# Patient Record
Sex: Female | Born: 1990 | Race: White | Hispanic: No | Marital: Single | Smoking: Never smoker
Health system: Southern US, Community
[De-identification: ages and names within clinical notes are randomized; demographics above are authoritative.]

## PROBLEM LIST (undated history)

## (undated) DIAGNOSIS — F32A Depression, unspecified: Secondary | ICD-10-CM

## (undated) DIAGNOSIS — G90A Postural orthostatic tachycardia syndrome (POTS): Secondary | ICD-10-CM

## (undated) DIAGNOSIS — I498 Other specified cardiac arrhythmias: Secondary | ICD-10-CM

## (undated) DIAGNOSIS — K911 Postgastric surgery syndromes: Secondary | ICD-10-CM

## (undated) DIAGNOSIS — Q796 Ehlers-Danlos syndrome, unspecified: Secondary | ICD-10-CM

## (undated) DIAGNOSIS — F419 Anxiety disorder, unspecified: Secondary | ICD-10-CM

## (undated) HISTORY — PX: UPPER GI ENDOSCOPY: SHX6162

## (undated) HISTORY — PX: INNER EAR SURGERY: SHX679

---
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
## 2014-09-12 ENCOUNTER — Other Ambulatory Visit: Payer: Self-pay | Admitting: Cardiology

## 2014-09-12 DIAGNOSIS — I701 Atherosclerosis of renal artery: Secondary | ICD-10-CM

## 2014-09-12 DIAGNOSIS — I150 Renovascular hypertension: Secondary | ICD-10-CM

## 2014-09-21 ENCOUNTER — Ambulatory Visit
Admission: RE | Admit: 2014-09-21 | Discharge: 2014-09-21 | Disposition: A | Discharge status: Home / Self Care | Payer: 59 | Source: Ambulatory Visit | Attending: Cardiology | Admitting: Cardiology

## 2014-09-21 DIAGNOSIS — I150 Renovascular hypertension: Secondary | ICD-10-CM

## 2014-09-21 DIAGNOSIS — I701 Atherosclerosis of renal artery: Secondary | ICD-10-CM

## 2014-09-21 MED ORDER — GADOBENATE DIMEGLUMINE 529 MG/ML IV SOLN
12.0000 mL | Freq: Once | INTRAVENOUS | Status: AC | PRN
  Administered 2014-09-21: 12 mL via INTRAVENOUS

## 2014-10-10 DIAGNOSIS — I10 Essential (primary) hypertension: Secondary | ICD-10-CM | POA: Insufficient documentation

## 2014-10-24 DIAGNOSIS — I951 Orthostatic hypotension: Secondary | ICD-10-CM | POA: Insufficient documentation

## 2014-11-04 DIAGNOSIS — G90A Postural orthostatic tachycardia syndrome (POTS): Secondary | ICD-10-CM | POA: Insufficient documentation

## 2014-11-04 DIAGNOSIS — I498 Other specified cardiac arrhythmias: Secondary | ICD-10-CM | POA: Insufficient documentation

## 2018-09-20 ENCOUNTER — Encounter

## 2018-09-21 ENCOUNTER — Encounter: Payer: Self-pay | Admitting: Physical Therapy

## 2018-09-21 ENCOUNTER — Other Ambulatory Visit: Payer: Self-pay

## 2018-09-21 ENCOUNTER — Ambulatory Visit: Payer: Managed Care, Other (non HMO) | Attending: Internal Medicine | Admitting: Physical Therapy

## 2018-09-21 DIAGNOSIS — R42 Dizziness and giddiness: Secondary | ICD-10-CM | POA: Insufficient documentation

## 2018-09-21 DIAGNOSIS — R000 Tachycardia, unspecified: Secondary | ICD-10-CM | POA: Insufficient documentation

## 2018-09-21 DIAGNOSIS — G90A Postural orthostatic tachycardia syndrome (POTS): Secondary | ICD-10-CM

## 2018-09-21 DIAGNOSIS — I951 Orthostatic hypotension: Secondary | ICD-10-CM | POA: Diagnosis present

## 2018-09-21 DIAGNOSIS — I498 Other specified cardiac arrhythmias: Secondary | ICD-10-CM

## 2018-09-21 NOTE — Therapy (Signed)
[facility] [address] [place], [place], [address] Phone: [phone]   Fax:  [phone]  Physical Therapy Treatment  Patient Details  Name: Roberta SchaumannMegan L Niziolek MRN: [id number] Date of Birth: 1990/04/26 Referring Provider (PT): Dr. Merri BrunetteWalter Pharr, Dr. Gwendlyn DeutscherBrian Rubery (Cardiologist)    Encounter Date: 09/21/2018  PT End of Session - 09/21/18 1052    Visit Number  1    Number of Visits  8    Date for PT Re-Evaluation  11/16/18    PT Start Time  0830    PT Stop Time  0916    PT Time Calculation (min)  46 min    Activity Tolerance  Patient tolerated treatment well    Behavior During Therapy  [facility] for tasks assessed/performed       History reviewed. No pertinent past medical history.  History reviewed. No pertinent surgical history.  There were no vitals filed for this visit.  Subjective Assessment - 09/21/18 0836    Subjective  Pt was diagnosed about 5 yrs ago with POTS.  She has a rapid HR all day long.  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  Gradually she has noticed decreased ability to walk, jog and even work. Does not do much on her days off.  She has to see a patient and then doc each time as standing still is very challenging.   She feels weak, mildly in LEs.  Has a Fitbit HR monitor.    Pertinent History  POTS    Limitations  Standing    How long can you stand comfortably?  15 min    How long can you walk comfortably?  can do up to an hour.    Diagnostic tests  tilt table    Patient Stated Goals  Patient would like to have mroe energy and strength    Currently in Pain?  No/denies         [facility] PT Assessment - 09/21/18 0001      Assessment   Medical Diagnosis  POTS    Referring Provider (PT)  Dr. Merri BrunetteWalter Pharr, Dr. Gwendlyn DeutscherBrian Rubery (Cardiologist)     Onset Date/Surgical Date  --   5 yrs    Hand Dominance  Right    Prior Therapy  no       Precautions   Precautions  None      Restrictions   Weight Bearing  Restrictions  No      Balance Screen   Has the patient fallen in the past 6 months  No      Home Environment   Living Environment  Private residence    Living Arrangements  Alone    Type of Home  Apartment    Home Access  Elevator    Home Layout  One level      Prior Function   Level of Independence  Independent    Vocation  Full time employment    Vocation Requirements  SLP     Leisure  friends, shopping, active       Cognition   Overall Cognitive Status  Within Functional Limits for tasks assessed      Observation/Other Assessments   Focus on Therapeutic Outcomes (FOTO)   NT due to diagnosis       Sensation   Light Touch  Appears Intact      Squat   Comments  good      Single Leg Stance   Comments  good, visible lateral hip weakness       Sit to Stand   Comments  30 min no UE x 10 reps       Posture/Postural Control   Posture Comments  locks knees in standing , mild valgus       Strength   Right Shoulder Flexion  4+/5    Right Shoulder ABduction  4/5    Left Shoulder Flexion  4+/5    Left Shoulder ABduction  4/5    Right Hand Grip (lbs)  40    Left Hand Grip (lbs)  38    Right Hip Flexion  4+/5    Left Hip Flexion  4+/5    Right/Left Knee  --   4+/5      Palpation   Spinal mobility          Resting vitals:  HR 82  BP 146/90 Orthostatic HR 79  BP 130/92 O2 sats 98-99% throughout      PT Short Term Goals - 09/21/18 1056      PT SHORT TERM GOAL #1   Title  Pt will be consistent and I with HEP for core, large muscle group strengthening    Time  4    Period  Weeks    Status  New    Target Date  10/19/18      PT SHORT TERM GOAL #2   Title  Pt will use RPE and HR monitor (Fitbit) to monitor self while doing cardio    Time  4    Period  Weeks    Status  New    Target Date  10/19/18      PT SHORT TERM GOAL #3   Title  Pt will be able to report feeling less fatigue post work, able to do simple core work after a few hours of rest.    Time  4     Period  Weeks    Status  New    Target Date  10/19/18        PT Long Term Goals - 09/21/18 1453      PT LONG TERM GOAL #1   Title  Pt will be able to show Independence with HEP and consistent cardio routine    Time  8    Period  Weeks    Status  New    Target Date  11/16/18      PT LONG TERM GOAL #2   Title  Patient will be able to squat, lift up to 15 lbs x 10 with no pain, just min fatigue    Time  8    Period  Weeks    Status  New    Target Date  11/16/18      PT LONG TERM GOAL #3   Title  Pt will be able to see 2 patients at work consecutively without need to rest, document.    Time  8    Period  Weeks    Status  New    Target Date  11/16/18      PT LONG TERM GOAL #4   Title  Pt will be able to complete 30 min of cardio without undue fatigue    Time  8    Period  Weeks    Status  New    Target Date  11/16/18            Plan - 09/21/18 1225    Clinical Impression Statement  Patient presents  for low complexity eval of POTS and associated symptoms of fatigue, tachycardia and dizziness.  She demonstrated an appropriate rise of HR with activity. Educated on RPE, principles of exercise and objectives of PT to positviely impact her condition. She plans to bring in her Fit Bit next time. She has an apartment gym which she can use.  She realizes she will need to keep exercising to maintain her health and is motivated to do so.    Personal Factors and Comorbidities  Time since onset of injury/illness/exacerbation;Other   nature of her condition   Examination-Activity Limitations  Stand;Locomotion Level;Other    Examination-Participation Restrictions  Community Activity    Stability/Clinical Decision Making  Evolving/Moderate complexity    Clinical Decision Making  Moderate    Rehab Potential  Excellent    PT Frequency  1x / week    PT Duration  8 weeks    PT Treatment/Interventions  ADLs/Self Care Home Management;Therapeutic activities;Patient/family  education;Therapeutic exercise;Other (comment)   Pilates for core and recumbant strength/endurance   PT Next Visit Plan  HEP for core, guidance for weighted machines , start with recumbant bike intervals , use RPE /HR raise of at least 30 bpm    PT Home Exercise Plan  not yet, just education    Consulted and Agree with Plan of Care  Patient       Patient will benefit from skilled therapeutic intervention in order to improve the following deficits and impairments:  Postural dysfunction, Decreased activity tolerance, Cardiopulmonary status limiting activity, Decreased mobility, Dizziness  Visit Diagnosis: 1. POTS (postural orthostatic tachycardia syndrome)   2. Dizziness and giddiness        Problem List Patient Active Problem List   Diagnosis Date Noted  . Lymphadenopathy 02/10/2011    PAA,JENNIFER 09/21/2018, 3:13 PM  [facility] [address] [place], [place], [address] Phone: [phone]   Fax:  [phone]  Name: SHATARA STANEK MRN: [id number] Date of Birth: 03/25/1990  [name], PT 09/21/18 3:25 PM Phone: [phone] Fax: [phone]

## 2018-10-05 ENCOUNTER — Other Ambulatory Visit: Payer: Self-pay

## 2018-10-05 ENCOUNTER — Ambulatory Visit: Payer: Managed Care, Other (non HMO) | Admitting: Physical Therapy

## 2018-10-05 ENCOUNTER — Encounter: Payer: Self-pay | Admitting: Physical Therapy

## 2018-10-05 VITALS — BP 140/82 | HR 130

## 2018-10-05 DIAGNOSIS — I498 Other specified cardiac arrhythmias: Secondary | ICD-10-CM

## 2018-10-05 DIAGNOSIS — R000 Tachycardia, unspecified: Secondary | ICD-10-CM | POA: Diagnosis not present

## 2018-10-05 DIAGNOSIS — R42 Dizziness and giddiness: Secondary | ICD-10-CM

## 2018-10-05 DIAGNOSIS — G90A Postural orthostatic tachycardia syndrome (POTS): Secondary | ICD-10-CM

## 2018-10-05 NOTE — Therapy (Signed)
[place], [place], [address] Phone: [phone]   Fax:  [phone]  Physical Therapy Treatment  Patient Details  Name: Roberta [name] MRN: [id number] Date of Birth: 06/05/1990 Referring Provider (PT): Dr. Deland Pretty, Dr. Tresa Garter (Cardiologist)    Encounter Date: 10/05/2018  PT End of Session - 10/05/18 0910    Visit Number  2    Number of Visits  8    Date for PT Re-Evaluation  11/16/18    PT Start Time  0830    PT Stop Time  0914    PT Time Calculation (min)  44 min    Activity Tolerance  Patient tolerated treatment well    Behavior During Therapy  [facility] for tasks assessed/performed       History reviewed. No pertinent past medical history.  History reviewed. No pertinent surgical history.  Vitals:   10/05/18 0848  BP: 140/82  Pulse: (!) 130    Subjective Assessment - 10/05/18 0833    Subjective  Its worse in the heat, Ive found.  Wore HR monitor.    Currently in Pain?  No/denies         [facility] PT Assessment - 10/05/18 0001      Strength   Strength Assessment Site  --   core is fair from tabletop position notd strain and fatigue    Right Hip Flexion  4+/5    Right Hip Extension  4/5    Right Hip ABduction  4/5    Left Hip Flexion  4+/5    Left Hip Extension  4-/5    Left Hip ABduction  4/5          [facility] Adult PT Treatment/Exercise - 10/05/18 0001      Self-Care   Other Self-Care Comments   RPE      Lumbar Exercises: Aerobic   Stationary Bike  8 min L1, gradual increase in incline, RPE 13       Knee/Hip Exercises: Standing   Heel Raises  Both;1 set;20 reps    Heel Raises Limitations  off step       Knee/Hip Exercises: Supine   Bridges  Strengthening;Both;1 set;10 reps    Bridges Limitations  articulation     Straight Leg Raises  Strengthening;Both;1 set;15 reps    Straight Leg Raises Limitations  4 way SLR       Knee/Hip Exercises: Sidelying   Hip ABduction   Strengthening;Both;1 set;15 reps    Hip ADduction  Strengthening;Both;1 set;15 reps      Knee/Hip Exercises: Prone   Straight Leg Raises  Strengthening;Both;1 set;15 reps           PT Short Term Goals - 09/21/18 1056      PT SHORT TERM GOAL #1   Title  Pt will be consistent and I with HEP for core, large muscle group strengthening    Time  4    Period  Weeks    Status  New    Target Date  10/19/18      PT SHORT TERM GOAL #2   Title  Pt will use RPE and HR monitor (Fitbit) to monitor self while doing cardio    Time  4    Period  Weeks    Status  New    Target Date  10/19/18      PT SHORT TERM GOAL #3   Title  Pt will be able to report feeling less fatigue post work,  able to do simple core work after a few hours of rest.    Time  4    Period  Weeks    Status  New    Target Date  10/19/18        PT Long Term Goals - 09/21/18 1453      PT LONG TERM GOAL #1   Title  Pt will be able to show Independence with HEP and consistent cardio routine    Time  8    Period  Weeks    Status  New    Target Date  11/16/18      PT LONG TERM GOAL #2   Title  Patient will be able to squat, lift up to 15 lbs x 10 with no pain, just min fatigue    Time  8    Period  Weeks    Status  New    Target Date  11/16/18      PT LONG TERM GOAL #3   Title  Pt will be able to see 2 patients at work consecutively without need to rest, document.    Time  8    Period  Weeks    Status  New    Target Date  11/16/18      PT LONG TERM GOAL #4   Title  Pt will be able to complete 30 min of cardio without undue fatigue    Time  8    Period  Weeks    Status  New    Target Date  11/16/18            Plan - 10/05/18 0913    Clinical Impression Statement  Pt did well today, was able to establish HEP for hips, core without difficulty.  Noticeable muscle fatigue, core weakness  with basic exercises.  BP post session 138/86, Max HR was 130 on recumbant bike.    PT Treatment/Interventions   ADLs/Self Care Home Management;Therapeutic activities;Patient/family education;Therapeutic exercise;Other (comment)    PT Next Visit Plan  check HEP for core, Pilates    PT Home Exercise Plan  4 way SLR, calf raise, bridge, supine knee lift (core)    Consulted and Agree with Plan of Care  Patient       Patient will benefit from skilled therapeutic intervention in order to improve the following deficits and impairments:  Postural dysfunction, Decreased activity tolerance, Cardiopulmonary status limiting activity, Decreased mobility, Dizziness  Visit Diagnosis: 1. POTS (postural orthostatic tachycardia syndrome)   2. Dizziness and giddiness        Problem List Patient Active Problem List   Diagnosis Date Noted  . Lymphadenopathy 02/10/2011    PAA,JENNIFER 10/05/2018, 10:52 AM  [facility] [address] [place], [place], [address] Phone: [phone]   Fax:  [phone]  Name: Roberta [name] MRN: [id number] Date of Birth: [birth year]  Karie MainlandJennifer Paa, PT 10/05/18 10:52 AM Phone: [phone] Fax: [phone]

## 2018-10-05 NOTE — Patient Instructions (Signed)
   Step 1  Step 2  Standing Heel Raise reps: 10  sets: 2  hold: 5  daily: 1  weekly: 7 Setup  Begin in a standing upright position with your feet shoulder width apart.  Movement  Slowly raise both heels off the ground at the same time, then lower them down to the floor.  Tip  Make sure to keep your upper body still and avoid gripping with your toes.

## 2018-10-12 ENCOUNTER — Other Ambulatory Visit: Payer: Self-pay

## 2018-10-12 ENCOUNTER — Ambulatory Visit: Payer: Managed Care, Other (non HMO) | Admitting: Physical Therapy

## 2018-10-12 ENCOUNTER — Encounter: Payer: Self-pay | Admitting: Physical Therapy

## 2018-10-12 DIAGNOSIS — R42 Dizziness and giddiness: Secondary | ICD-10-CM

## 2018-10-12 DIAGNOSIS — I498 Other specified cardiac arrhythmias: Secondary | ICD-10-CM

## 2018-10-12 DIAGNOSIS — G90A Postural orthostatic tachycardia syndrome (POTS): Secondary | ICD-10-CM

## 2018-10-12 DIAGNOSIS — R000 Tachycardia, unspecified: Secondary | ICD-10-CM | POA: Diagnosis not present

## 2018-10-12 NOTE — Patient Instructions (Signed)
Butterfly Bridge reps: 10 sets: 2 hold: 5 daily: 1  weekly: 7      Exercise image step 1   Exercise image step 2  Setup  Begin lying on your back with both legs bent and your feet resting on the floor. Movement  Tighten your abdominals and lift your hips off the floor into a bridge position. As you hold this position and pull your knees apart, then bring them back and repeat. Tip  Make sure to keep your back straight during the exercise and do not let your hips drop or rotate to either side.

## 2018-10-12 NOTE — Therapy (Signed)
[place], [place], [address] Phone: [phone]   Fax:  [phone]  Physical Therapy Treatment  Patient Details  Name: Roberta [name] MRN: [id number] Date of Birth: 10-19-1990 Referring Provider (PT): Dr. Deland Pretty, Dr. Tresa Garter (Cardiologist)    Encounter Date: 10/12/2018  PT End of Session - 10/12/18 0846    Visit Number  3    Number of Visits  8    Date for PT Re-Evaluation  11/16/18    PT Start Time  0833    PT Stop Time  0915    PT Time Calculation (min)  42 min    Activity Tolerance  Patient tolerated treatment well    Behavior During Therapy  [facility] for tasks assessed/performed       History reviewed. No pertinent past medical history.  History reviewed. No pertinent surgical history.  There were no vitals filed for this visit.  Subjective Assessment - 10/12/18 0842    Subjective  No new symptoms.  I've done my exercises each day.  The bridges are hard.    Currently in Pain?  No/denies       Pilates Tower for LE/Core strength, postural strength, lumbopelvic disassociation and core control.  Exercises included:  Supine Leg [place] Yellow single leg arcs in parallel and turn out and then circles each direction x 10   Arm [place] yellow arcs 10 ,  added march and bridge for core challenge   Sidelying leg springs  Single leg abduction/adduction x 10   Flexion/extension x 10   Circles x 8 each direction      [facility] Adult PT Treatment/Exercise - 10/12/18 0001      Pilates   Pilates Tower  See notes       Lumbar Exercises: Aerobic   Stationary Bike  8 min L1, gradual increase in incline, RPE 13    HR up to 133, RPE 14-15      Knee/Hip Exercises: Supine   Bridges  Strengthening;Both;1 set;10 reps    Bridges Limitations  bridge with march     Bridges with Clamshell  Strengthening;Both;1 set          PT Short Term Goals - 10/12/18 0928      PT SHORT TERM GOAL #1   Title   Pt will be consistent and I with HEP for core, large muscle group strengthening    Baseline  up tp date thus far    Status  Partially Met      PT SHORT TERM GOAL #2   Title  Pt will use RPE and HR monitor (Fitbit) to monitor self while doing cardio    Status  Achieved      PT SHORT TERM GOAL #3   Title  Pt will be able to report feeling less fatigue post work, able to do simple core work after a few hours of rest.    Status  On-going        PT Long Term Goals - 09/21/18 1453      PT LONG TERM GOAL #1   Title  Pt will be able to show Independence with HEP and consistent cardio routine    Time  8    Period  Weeks    Status  New    Target Date  11/16/18      PT LONG TERM GOAL #2   Title  Patient will be able to squat, lift up to 15 lbs x 10 with  no pain, just min fatigue    Time  8    Period  Weeks    Status  New    Target Date  11/16/18      PT LONG TERM GOAL #3   Title  Pt will be able to see 2 patients at work consecutively without need to rest, document.    Time  8    Period  Weeks    Status  New    Target Date  11/16/18      PT LONG TERM GOAL #4   Title  Pt will be able to complete 30 min of cardio without undue fatigue    Time  8    Period  Weeks    Status  New    Target Date  11/16/18            Plan - 10/12/18 0929    Clinical Impression Statement  Pt shows I with HEP.  No functional improvment yet, but does HEP daily.  Used Pilates Tower today for core control and strength.  Needed some assistance with maintaining trunk control and stability but overall did well, legs fatigued.    PT Treatment/Interventions  ADLs/Self Care Home Management;Therapeutic activities;Patient/family education;Therapeutic exercise;Other (comment)    PT Next Visit Plan  any progress? cont with core,hip strength, try UBE and bike    PT Home Exercise Plan  4 way SLR, calf raise, bridge, supine knee lift (core), bridge with clam, march    Consulted and Agree with Plan of Care   Patient       Patient will benefit from skilled therapeutic intervention in order to improve the following deficits and impairments:  Postural dysfunction, Decreased activity tolerance, Cardiopulmonary status limiting activity, Decreased mobility, Dizziness  Visit Diagnosis: 1. POTS (postural orthostatic tachycardia syndrome)   2. Dizziness and giddiness        Problem List Patient Active Problem List   Diagnosis Date Noted  . Lymphadenopathy 02/10/2011    Deniece Rankin 10/12/2018, 9:35 AM  [facility] [address] [place], [place], [address] Phone: [phone]   Fax:  [phone]  Name: Roberta [name] MRN: [id number] Date of Birth: 1990-11-01  [name], PT 10/12/18 9:48 AM Phone: [phone] Fax: [phone]

## 2018-10-19 ENCOUNTER — Encounter: Payer: Self-pay | Admitting: Physical Therapy

## 2018-10-19 ENCOUNTER — Ambulatory Visit: Payer: Managed Care, Other (non HMO) | Admitting: Physical Therapy

## 2018-10-19 ENCOUNTER — Other Ambulatory Visit: Payer: Self-pay

## 2018-10-19 VITALS — BP 159/93 | HR 95

## 2018-10-19 DIAGNOSIS — R000 Tachycardia, unspecified: Secondary | ICD-10-CM | POA: Diagnosis not present

## 2018-10-19 DIAGNOSIS — I498 Other specified cardiac arrhythmias: Secondary | ICD-10-CM

## 2018-10-19 DIAGNOSIS — G90A Postural orthostatic tachycardia syndrome (POTS): Secondary | ICD-10-CM

## 2018-10-19 DIAGNOSIS — R42 Dizziness and giddiness: Secondary | ICD-10-CM

## 2018-10-19 NOTE — Therapy (Signed)
[place], [place], [address] Phone: [phone]   Fax:  [phone]  Physical Therapy Treatment  Patient Details  Name: Roberta [name] MRN: [id number] Date of Birth: 12/11/1990 Referring Provider (PT): Dr. Deland Pretty, Dr. Tresa Garter (Cardiologist)    Encounter Date: 10/19/2018  PT End of Session - 10/19/18 0758    Visit Number  4    Number of Visits  8    Date for PT Re-Evaluation  11/16/18    PT Start Time  0800    PT Stop Time  0845    PT Time Calculation (min)  45 min    Activity Tolerance  Patient tolerated treatment well    Behavior During Therapy  [facility] for tasks assessed/performed       History reviewed. No pertinent past medical history.  History reviewed. No pertinent surgical history.  Vitals:   10/19/18 0806 10/19/18 0839  BP: 127/62 (!) 159/93  Pulse: 80 95  SpO2: 99% 99%    Subjective Assessment - 10/19/18 0805    Subjective  "I am notice I am doing better, and I am doing the exercise at home every day         [facility] PT Assessment - 10/19/18 0001      Assessment   Medical Diagnosis  POTS    Referring Provider (PT)  Dr. Deland Pretty, Dr. Tresa Garter (Cardiologist)                    [facility] Adult PT Treatment/Exercise - 10/19/18 0001      Lumbar Exercises: Aerobic   Stationary Bike  10 min L2, gradual increase in incline every 62mn   end of aerobic 99%, 121 BMP, RPE 13     Knee/Hip Exercises: Machines for Strengthening   Cybex Leg Press  2 x 10 20#, 2 x 10 40#   bil heel raises following leg press 2 x 20 with 40#     Knee/Hip Exercises: Standing   Heel Raises  --    Heel Raises Limitations  --      Knee/Hip Exercises: Supine   Bridges with Clamshell  Both;1 set;15 reps   with red theraband   Other Supine Knee/Hip Exercises  alternating marching with red theraband around the knees.   while keeping core tight and focus on breathing     Knee/Hip  Exercises: Sidelying   Hip ABduction  Strengthening;Both;10 reps;3 sets   1 set regular, 1 CW, 1 CCW    Hip ADduction  Strengthening;Both;1 set;20 reps      Knee/Hip Exercises: Prone   Straight Leg Raises  Strengthening;Both;1 set;15 reps             PT Education - 10/19/18 0845    Education Details  updated HEP with sidelying hip abduction adding circles    Person(s) Educated  Patient    Methods  Explanation;Verbal cues;Handout    Comprehension  Verbalized understanding;Verbal cues required       PT Short Term Goals - 10/12/18 0928      PT SHORT TERM GOAL #1   Title  Pt will be consistent and I with HEP for core, large muscle group strengthening    Baseline  up tp date thus far    Status  Partially Met      PT SHORT TERM GOAL #2   Title  Pt will use RPE and HR monitor (Fitbit) to monitor self while doing cardio  Status  Achieved      PT SHORT TERM GOAL #3   Title  Pt will be able to report feeling less fatigue post work, able to do simple core work after a few hours of rest.    Status  On-going        PT Long Term Goals - 09/21/18 1453      PT LONG TERM GOAL #1   Title  Pt will be able to show Independence with HEP and consistent cardio routine    Time  8    Period  Weeks    Status  New    Target Date  11/16/18      PT LONG TERM GOAL #2   Title  Patient will be able to squat, lift up to 15 lbs x 10 with no pain, just min fatigue    Time  8    Period  Weeks    Status  New    Target Date  11/16/18      PT LONG TERM GOAL #3   Title  Pt will be able to see 2 patients at work consecutively without need to rest, document.    Time  8    Period  Weeks    Status  New    Target Date  11/16/18      PT LONG TERM GOAL #4   Title  Pt will be able to complete 30 min of cardio without undue fatigue    Time  8    Period  Weeks    Status  New    Target Date  11/16/18            Plan - 10/19/18 0086    Clinical Impression Statement  pt reports  improvement with endurance and is consistent with her HEP daily. Focused on hip/ knee strengthening which she did fatigue quickly with but reported no pain or discomfort. pt HR maxed out at 125 during biking but remained between 98-105 during all exericses.    PT Treatment/Interventions  ADLs/Self Care Home Management;Therapeutic activities;Patient/family education;Therapeutic exercise;Other (comment)    PT Next Visit Plan  cont with core,hip strength, try UBE and bike    PT Home Exercise Plan  4 way SLR, calf raise, bridge, supine knee lift (core), bridge with clam, march       Patient will benefit from skilled therapeutic intervention in order to improve the following deficits and impairments:     Visit Diagnosis: 1. POTS (postural orthostatic tachycardia syndrome)   2. Dizziness and giddiness        Problem List Patient Active Problem List   Diagnosis Date Noted  . Lymphadenopathy 02/10/2011   Starr Lake PT, DPT, LAT, ATC  10/19/18  8:48 AM      [place] [facility] [address] [place], [place], [address] Phone: [phone]   Fax:  [phone]  Name: Roberta [name] MRN: [id number] Date of Birth: 10/04/1990

## 2018-10-26 ENCOUNTER — Encounter: Payer: Self-pay | Admitting: Physical Therapy

## 2018-10-26 ENCOUNTER — Other Ambulatory Visit: Payer: Self-pay

## 2018-10-26 ENCOUNTER — Ambulatory Visit: Payer: Managed Care, Other (non HMO) | Attending: Internal Medicine | Admitting: Physical Therapy

## 2018-10-26 ENCOUNTER — Encounter: Payer: Managed Care, Other (non HMO) | Admitting: Physical Therapy

## 2018-10-26 DIAGNOSIS — I951 Orthostatic hypotension: Secondary | ICD-10-CM | POA: Diagnosis present

## 2018-10-26 DIAGNOSIS — R42 Dizziness and giddiness: Secondary | ICD-10-CM | POA: Diagnosis present

## 2018-10-26 DIAGNOSIS — G90A Postural orthostatic tachycardia syndrome (POTS): Secondary | ICD-10-CM

## 2018-10-26 DIAGNOSIS — I498 Other specified cardiac arrhythmias: Secondary | ICD-10-CM

## 2018-10-26 DIAGNOSIS — R000 Tachycardia, unspecified: Secondary | ICD-10-CM | POA: Insufficient documentation

## 2018-10-26 NOTE — Therapy (Signed)
[facility] [address] [place], [place], [address] Phone: [phone]   Fax:  [phone]  Physical Therapy Treatment  Patient Details  Name: Roberta [name] MRN: [id number] Date of Birth: 01/29/1991 Referring Provider (PT): Dr. Merri BrunetteWalter Pharr, Dr. Gwendlyn DeutscherBrian Rubery (Cardiologist)    Encounter Date: 10/26/2018  PT End of Session - 10/26/18 1137    Visit Number  5    Number of Visits  8    Date for PT Re-Evaluation  11/16/18    PT Start Time  1132    PT Stop Time  1218    PT Time Calculation (min)  46 min    Activity Tolerance  Patient tolerated treatment well    Behavior During Therapy  [facility] for tasks assessed/performed       History reviewed. No pertinent past medical history.  History reviewed. No pertinent surgical history.  There were no vitals filed for this visit.  Subjective Assessment - 10/26/18 1133    Subjective  No complaints today.  Cardiologist this AM.  Increased meds.  Will need to monitor for low blood pressure, dyspnea next week.    Currently in Pain?  No/denies        PT Education - 10/26/18 1224    Education Details  Pilates resources    Person(s) Educated  Patient    Methods  Explanation    Comprehension  Verbalized understanding      Pilates Reformer used for LE/core strength, postural strength, lumbopelvic disassociation and core control.  Exercises included:  Footwork 2 Red 1 blue on jumpboard in various positions for warm up Moved to 1 Red 1 Yellow for light jumping parallel, turnout, wide, narrow, single leg x 10-15 each   Supine Arms Arcs in parallel 1 Red 1 yellow   Supine Abs chest lift x 10 1 Red 1 Yellow   Sidelying jumping for hip abduction 1 red 1 Red x 15 reps each   HR 98-109 during session   Reverse Abdominals 1 Blue UE only x 8, then LE x 10    Scooter 1 Red stand on long box HR to 110 bpm   PT Short Term Goals - 10/26/18 1225      PT SHORT TERM GOAL #1   Title  Pt will be  consistent and I with HEP for core, large muscle group strengthening    Status  Achieved      PT SHORT TERM GOAL #2   Title  Pt will use RPE and HR monitor (Fitbit) to monitor self while doing cardio    Status  Achieved      PT SHORT TERM GOAL #3   Title  Pt will be able to report feeling less fatigue post work, able to do simple core work after a few hours of rest.    Status  On-going        PT Long Term Goals - 10/26/18 1225      PT LONG TERM GOAL #1   Title  Pt will be able to show Independence with HEP and consistent cardio routine    Status  On-going      PT LONG TERM GOAL #2   Title  Patient will be able to squat, lift up to 15 lbs x 10 with no pain, just min fatigue    Status  Unable to assess      PT LONG TERM GOAL #3   Title  Pt will be able to see 2 patients at  work consecutively without need to rest, document.    Status  Unable to assess      PT LONG TERM GOAL #4   Title  Pt will be able to complete 30 min of cardio without undue fatigue    Status  On-going            Plan - 10/26/18 1140    Clinical Impression Statement  Patient with subjective report of improved exercise capacity.  Not sure if she is seeing benefit realted to work activities.  She was able to increase her exercise levels today , HR up to 110 on Reformer, no symptoms offered.  Cont with POC.    PT Treatment/Interventions  ADLs/Self Care Home Management;Therapeutic activities;Patient/family education;Therapeutic exercise;Other (comment)    PT Next Visit Plan  cont with core,hip strength, try UBE and bike, Reformer. Upgrade HEP and ask about work capacity    PT Home Exercise Plan  4 way SLR, calf raise, bridge, supine knee lift (core), bridge with clam, march    Consulted and Agree with Plan of Care  Patient       Patient will benefit from skilled therapeutic intervention in order to improve the following deficits and impairments:     Visit Diagnosis: 1. POTS (postural orthostatic  tachycardia syndrome)   2. Dizziness and giddiness        Problem List Patient Active Problem List   Diagnosis Date Noted  . Lymphadenopathy 02/10/2011    PAA,JENNIFER 10/26/2018, 12:27 PM  [facility] [address] [place], [place], [address] Phone: [phone]   Fax:  [phone]  Name: Roberta [name] MRN: [id number] Date of Birth: 08-13-1990  [name], PT 10/26/18 12:27 PM Phone: [phone] Fax: [phone]

## 2018-11-02 ENCOUNTER — Encounter: Payer: Self-pay | Admitting: Physical Therapy

## 2018-11-02 ENCOUNTER — Other Ambulatory Visit: Payer: Self-pay

## 2018-11-02 ENCOUNTER — Ambulatory Visit: Payer: Managed Care, Other (non HMO) | Admitting: Physical Therapy

## 2018-11-02 DIAGNOSIS — I498 Other specified cardiac arrhythmias: Secondary | ICD-10-CM

## 2018-11-02 DIAGNOSIS — R000 Tachycardia, unspecified: Secondary | ICD-10-CM | POA: Diagnosis not present

## 2018-11-02 DIAGNOSIS — G90A Postural orthostatic tachycardia syndrome (POTS): Secondary | ICD-10-CM

## 2018-11-02 DIAGNOSIS — R42 Dizziness and giddiness: Secondary | ICD-10-CM

## 2018-11-02 NOTE — Therapy (Signed)
[place], [place], [address] Phone: [phone]   Fax:  [phone]  Physical Therapy Treatment  Patient Details  Name: Roberta [name] MRN: [id number] Date of Birth: [birth year] Referring Provider (PT): Dr. Deland Pretty, Dr. Tresa Garter (Cardiologist)    Encounter Date: 11/02/2018  PT End of Session - 11/02/18 1018    Visit Number  6    Number of Visits  8    Date for PT Re-Evaluation  11/16/18    PT Start Time  1000    PT Stop Time  1045    PT Time Calculation (min)  45 min    Activity Tolerance  Patient tolerated treatment well    Behavior During Therapy  [facility] for tasks assessed/performed       History reviewed. No pertinent past medical history.  History reviewed. No pertinent surgical history.  There were no vitals filed for this visit.  Subjective Assessment - 11/02/18 1001    Subjective  New medicine is not working.  Cardiology is meeting today to see what changes should be made.        [facility] Adult PT Treatment/Exercise - 11/02/18 0001      Lumbar Exercises: Aerobic   Stationary Bike  8 min RPE 13 for warm up    SaO2 99%, HR up to 126   UBE (Upper Arm Bike)  end of session 5 min L1 , HR 104       Lumbar Exercises: Standing   Heel Raises  20 reps    Heel Raises Limitations  10 lbs x 3 sets     Functional Squats  20 reps    Functional Squats Limitations  10 lbs x 3 sets    Other Standing Lumbar Exercises  standing 10 lb press out on wall for core x 10       Lumbar Exercises: Supine   Advanced Lumbar Stabilization Limitations  Pilates circle abs: bridge with adduction, overhead lift and single leg stretch x 10     Other Supine Lumbar Exercises  horizontal abd blue band x 10     Other Supine Lumbar Exercises  narrow grip flexion blue band x 10 legs in table top       Knee/Hip Exercises: Supine   Bridges  Strengthening;Both;10 reps    Bridges with Clamshell  Strengthening;Both;1 set;10  reps   blue band and then without 1 set bilat. unilateral    Other Supine Knee/Hip Exercises  bridge and march x 10              PT Education - 11/02/18 1018    Education Details  combo moves for cardio (wgts) , RPE    Person(s) Educated  Patient    Methods  Explanation    Comprehension  Verbalized understanding       PT Short Term Goals - 11/02/18 1002      PT SHORT TERM GOAL #1   Title  Pt will be consistent and I with HEP for core, large muscle group strengthening    Status  Achieved      PT SHORT TERM GOAL #2   Title  Pt will use RPE and HR monitor (Fitbit) to monitor self while doing cardio    Status  Achieved      PT SHORT TERM GOAL #3   Title  Pt will be able to report feeling less fatigue post work, able to do simple core work after a few hours of  rest.    Baseline  was met then medicine was increased    Status  Partially Met        PT Long Term Goals - 11/02/18 1003      PT LONG TERM GOAL #1   Title  Pt will be able to show Independence with HEP and consistent cardio routine    Status  On-going      PT LONG TERM GOAL #2   Title  Patient will be able to squat, lift up to 15 lbs x 10 with no pain, just min fatigue    Status  On-going      PT LONG TERM GOAL #3   Title  Pt will be able to see 2 patients at work consecutively without need to rest, document.    Status  On-going      PT LONG TERM GOAL #4   Title  Pt will be able to complete 30 min of cardio without undue fatigue    Baseline  20 min    Status  On-going            Plan - 11/02/18 1042    Clinical Impression Statement  Did some more advanced core and LE work in supine and standing.  RPE 13 and HR about 98-110 throughout, mild dyspnea.    PT Treatment/Interventions  ADLs/Self Care Home Management;Therapeutic activities;Patient/family education;Therapeutic exercise;Other (comment)    PT Next Visit Plan  cont with core,hip strength, try UBE and bike, Reformer.    PT Home Exercise Plan   4 way SLR, calf raise, bridge, supine knee lift (core), bridge with clam, march    Consulted and Agree with Plan of Care  Patient       Patient will benefit from skilled therapeutic intervention in order to improve the following deficits and impairments:  Postural dysfunction, Decreased activity tolerance, Cardiopulmonary status limiting activity, Decreased mobility, Dizziness  Visit Diagnosis: 1. POTS (postural orthostatic tachycardia syndrome)   2. Dizziness and giddiness        Problem List Patient Active Problem List   Diagnosis Date Noted  . Lymphadenopathy 02/10/2011    Roberta [name] 11/02/2018, 12:30 PM  [facility] [address] [place], [place], [address] Phone: [phone]   Fax:  [phone]  Name: Roberta [name] MRN: [id number] Date of Birth: [birth year]  [name], PT 11/02/18 12:31 PM Phone: [phone] Fax: [phone]

## 2018-11-09 ENCOUNTER — Other Ambulatory Visit: Payer: Self-pay

## 2018-11-09 ENCOUNTER — Ambulatory Visit: Payer: Managed Care, Other (non HMO) | Admitting: Physical Therapy

## 2018-11-09 DIAGNOSIS — I498 Other specified cardiac arrhythmias: Secondary | ICD-10-CM

## 2018-11-09 DIAGNOSIS — R42 Dizziness and giddiness: Secondary | ICD-10-CM

## 2018-11-09 DIAGNOSIS — R000 Tachycardia, unspecified: Secondary | ICD-10-CM | POA: Diagnosis not present

## 2018-11-09 DIAGNOSIS — G90A Postural orthostatic tachycardia syndrome (POTS): Secondary | ICD-10-CM

## 2018-11-09 NOTE — Therapy (Signed)
[place], [place], [address] Phone: [phone]   Fax:  [phone]  Physical Therapy Treatment  Patient Details  Name: Roberta [name] MRN: [id number] Date of Birth: 13-Apr-1990 Referring Provider (PT): Dr. Deland Pretty, Dr. Tresa Garter (Cardiologist)    Encounter Date: 11/09/2018  PT End of Session - 11/09/18 1004    Visit Number  7    Number of Visits  8    Date for PT Re-Evaluation  11/16/18    PT Start Time  1003    PT Stop Time  1045    PT Time Calculation (min)  42 min    Activity Tolerance  Patient tolerated treatment well    Behavior During Therapy  [facility] for tasks assessed/performed       No past medical history on file.  No past surgical history on file.  There were no vitals filed for this visit.  Subjective Assessment - 11/09/18 1004    Subjective  Changed meds, no more beta blockers.  I fell less tired    Currently in Pain?  No/denies          [facility] Adult PT Treatment/Exercise - 11/09/18 0001      Pilates   Pilates Reformer  see note       Lumbar Exercises: Aerobic   UBE (Upper Arm Bike)  6 mim  L1  3 min each direction       Lumbar Exercises: Standing   Functional Squats  10 reps    Functional Squats Limitations  10 lbs x 1 sets, 15 lbs x 10    added calf raise HR 153   Forward Lunge  10 reps    Forward Lunge Limitations  10 lbs HR 139        Pilates Reformer used for LE/core strength, postural strength, lumbopelvic disassociation and core control.  Exercises included:  Footwork used ball under sacrum double and single leg.  2Red 1 blue parallel and turnout on heels and forefoot.    Bridging  Ball under sacrum x 10 added press out with hips extended x 10   Supine Arm work 1 [employer] and circles.  1 Red 1 Y too difficult, maintaining table top needs tactile cues    PT Short Term Goals - 11/02/18 1002      PT SHORT TERM GOAL #1   Title  Pt will be consistent and I  with HEP for core, large muscle group strengthening    Status  Achieved      PT SHORT TERM GOAL #2   Title  Pt will use RPE and HR monitor (Fitbit) to monitor self while doing cardio    Status  Achieved      PT SHORT TERM GOAL #3   Title  Pt will be able to report feeling less fatigue post work, able to do simple core work after a few hours of rest.    Baseline  was met then medicine was increased    Status  Partially Met        PT Long Term Goals - 11/09/18 1021      PT LONG TERM GOAL #1   Title  Pt will be able to show Independence with HEP and consistent cardio routine    Status  On-going      PT LONG TERM GOAL #2   Title  Patient will be able to squat, lift up to 15 lbs x 10 with no pain, just  min fatigue    Status  On-going      PT LONG TERM GOAL #3   Title  Pt will be able to see 2 patients at work consecutively without need to rest, document.    Status  On-going      PT LONG TERM GOAL #4   Title  Pt will be able to complete 30 min of cardio without undue fatigue    Baseline  20 min    Status  On-going            Plan - 11/09/18 1009    Clinical Impression Statement  HR response to exercise was elevated today, up to 153 with standing squats, due to change in medication.  She continues to struggle at work with long periods of standing. She is gaining energy and tolerance for horizontal exercises.    PT Treatment/Interventions  ADLs/Self Care Home Management;Therapeutic activities;Patient/family education;Therapeutic exercise;Other (comment)    PT Next Visit Plan  cont with core,hip strength, try UBE and bike, Reformer.    PT Home Exercise Plan  4 way SLR, calf raise, bridge, supine knee lift (core), bridge with clam, march    Consulted and Agree with Plan of Care  Patient       Patient will benefit from skilled therapeutic intervention in order to improve the following deficits and impairments:  Postural dysfunction, Decreased activity tolerance, Cardiopulmonary  status limiting activity, Decreased mobility, Dizziness  Visit Diagnosis: POTS (postural orthostatic tachycardia syndrome)  Dizziness and giddiness     Problem List Patient Active Problem List   Diagnosis Date Noted  . Lymphadenopathy 02/10/2011    [name] 11/09/2018, 1:14 PM  [facility] [address] [place], [place], [address] Phone: [phone]   Fax:  [phone]  Name: Roberta [name] MRN: [id number] Date of Birth: Apr 23, 1990  [name], PT 11/09/18 1:15 PM Phone: [phone] Fax: [phone]

## 2018-11-16 ENCOUNTER — Ambulatory Visit: Payer: Managed Care, Other (non HMO) | Admitting: Physical Therapy

## 2018-11-16 ENCOUNTER — Encounter: Payer: Self-pay | Admitting: Physical Therapy

## 2018-11-16 ENCOUNTER — Other Ambulatory Visit: Payer: Self-pay

## 2018-11-16 DIAGNOSIS — I498 Other specified cardiac arrhythmias: Secondary | ICD-10-CM

## 2018-11-16 DIAGNOSIS — R000 Tachycardia, unspecified: Secondary | ICD-10-CM | POA: Diagnosis not present

## 2018-11-16 DIAGNOSIS — R42 Dizziness and giddiness: Secondary | ICD-10-CM

## 2018-11-16 DIAGNOSIS — G90A Postural orthostatic tachycardia syndrome (POTS): Secondary | ICD-10-CM

## 2018-11-16 NOTE — Therapy (Signed)
[facility] [address] [place], [place], [address] Phone: [phone]   Fax:  [phone]  Physical Therapy Treatment/Renewal   Patient Details  Name: Roberta [name] MRN: [id number] Date of Birth: 05/29/1990 Referring Provider (PT): Dr. Walter Pharr, Dr. Brian Rubery (Cardiologist)    Encounter Date: 11/16/2018  PT End of Session - 11/16/18 0957    Visit Number  8    Number of Visits  16    Date for PT Re-Evaluation  01/11/19    PT Start Time  1000    PT Stop Time  1045    PT Time Calculation (min)  45 min    Activity Tolerance  Patient tolerated treatment well    Behavior During Therapy  [facility] for tasks assessed/performed       History reviewed. No pertinent past medical history.  History reviewed. No pertinent surgical history.  There were no vitals filed for this visit.  Subjective Assessment - 11/16/18 1004    Subjective  Its alot worse since no Beta blockers.  HR was 130-140 with normal work hours.  Goes down to 80 with just sitting at home.    Currently in Pain?  No/denies         [facility] PT Assessment - 11/16/18 0001      Assessment   Medical Diagnosis  POTS    Referring Provider (PT)  Dr. Walter Pharr, Dr. Brian Rubery (Cardiologist)     Prior Therapy  no       Precautions   Precautions  None      Restrictions   Weight Bearing Restrictions  No      Balance Screen   Has the patient fallen in the past 6 months  No      Home Environment   Living Environment  Private residence    Living Arrangements  Alone    Type of Home  Apartment    Home Access  Elevator    Home Layout  One level      Prior Function   Level of Independence  Independent    Vocation  Full time employment    Vocation Requirements  SLP     Leisure  friends, shopping, active       Cognition   Overall Cognitive Status  Within Functional Limits for tasks assessed      Observation/Other Assessments   Focus on Therapeutic Outcomes  (FOTO)   NT due to diagnosis       Sensation   Light Touch  Appears Intact      Functional Tests   Functional tests  Other      Other:   Other/ Comments  6 inch step test : RHR 117.  1 min step ups HR 156, took only 1 min to recover and return to normal       Strength   Strength Assessment Site  --   decreased muscle endurance , shaky    Right Hand Grip (lbs)  46    Left Hand Grip (lbs)  41    Right Hip Flexion  4+/5    Right Hip Extension  4/5    Right Hip ABduction  4/5    Left Hip Flexion  4+/5    Left Hip Extension  4/5    Left Hip ABduction  4/5    Right Knee Flexion  4+/5    Right Knee Extension  4+/5    Left Knee Flexion  4+/5    Left Knee   Extension  4+/5          [facility] Adult PT Treatment/Exercise - 11/16/18 0001      Knee/Hip Exercises: Aerobic   Stationary Bike  8 min L1-L3 intervals HR up to 139 at 4th minute SAo2 95-98%        Pilates Tower for LE/Core strength, postural strength, lumbopelvic disassociation and core control.  Exercises included:  Supine on foam roller: single arm x 10 with slastix Bilateral arms x 10   Supine Leg [place] single leg Arcs in parallel and turnout, circles x 10 each direction   PT Education - 11/16/18 1013    Education Details  POC, goals and HR recovery    Person(s) Educated  Patient    Methods  Explanation    Comprehension  Verbalized understanding       PT Short Term Goals - 11/16/18 1149      PT SHORT TERM GOAL #1   Title  Pt will be consistent and I with HEP for core, large muscle group strengthening    Status  Achieved      PT SHORT TERM GOAL #2   Title  Pt will use RPE and HR monitor (Fitbit) to monitor self while doing cardio    Status  Achieved      PT SHORT TERM GOAL #3   Title  Pt will be able to report feeling less fatigue post work, able to do simple core work after a few hours of rest.    Baseline  was met then medicine was increased    Status  Partially Met        PT Long Term Goals -  11/16/18 1150      PT LONG TERM GOAL #1   Title  Pt will be able to show Independence with HEP and consistent cardio routine    Status  On-going      PT LONG TERM GOAL #2   Title  Patient will be able to squat, lift up to 15 lbs x 10 with no pain, just min fatigue    Status  Achieved      PT LONG TERM GOAL #3   Title  Pt will be able to see 2 patients at work consecutively without need to rest, document.    Status  On-going      PT LONG TERM GOAL #4   Title  Pt will be able to complete 30 min of cardio without undue fatigue    Status  On-going      PT LONG TERM GOAL #5   Title  Pt will be able to show 5/5 strength in core and LEs    Time  8    Period  Weeks    Status  New    Target Date  01/11/19            Plan - 11/16/18 1151    Clinical Impression Statement  Pt continues to show good compliance with at home cardio as tolerated.  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  She plans to tell her cardiologist this.  She has palpitations, mild dyspnea but no chest pain.  HR reduced to about 85 at rest per her report.  She is enjoying Pilates and sees this as a long term choice for her.    Personal Factors and Comorbidities  Time since onset of injury/illness/exacerbation;Other    Examination-Activity Limitations  Stand;Locomotion Level;Other      Examination-Participation Restrictions  Community Activity    PT Treatment/Interventions  ADLs/Self Care Home Management;Therapeutic activities;Patient/family education;Therapeutic exercise;Other (comment)    PT Next Visit Plan  cont with core,hip strength, try UBE and bike, Reformer.    PT Home Exercise Plan  4 way SLR, calf raise, bridge, supine knee lift (core), bridge with clam, march    Consulted and Agree with Plan of Care  Patient       Patient will benefit from skilled therapeutic intervention in order to improve the following deficits and impairments:   Postural dysfunction, Decreased activity tolerance, Cardiopulmonary status limiting activity, Decreased mobility, Dizziness  Visit Diagnosis: POTS (postural orthostatic tachycardia syndrome)  Dizziness and giddiness     Problem List Patient Active Problem List   Diagnosis Date Noted  . Lymphadenopathy 02/10/2011    Chamar Broughton 11/16/2018, 12:04 PM  [facility] [address] [place], [place], [address] Phone: [phone]   Fax:  [phone]  Name: Roberta [name] MRN: [id number] Date of Birth: 31-May-1990  [name], PT 11/16/18 12:04 PM Phone: [phone] Fax: [phone]

## 2018-12-07 ENCOUNTER — Other Ambulatory Visit: Payer: Self-pay

## 2018-12-07 ENCOUNTER — Encounter

## 2018-12-07 ENCOUNTER — Ambulatory Visit: Payer: Managed Care, Other (non HMO) | Attending: Internal Medicine | Admitting: Physical Therapy

## 2018-12-07 VITALS — HR 100

## 2018-12-07 DIAGNOSIS — I951 Orthostatic hypotension: Secondary | ICD-10-CM | POA: Insufficient documentation

## 2018-12-07 DIAGNOSIS — R000 Tachycardia, unspecified: Secondary | ICD-10-CM | POA: Insufficient documentation

## 2018-12-07 DIAGNOSIS — H6123 Impacted cerumen, bilateral: Secondary | ICD-10-CM | POA: Insufficient documentation

## 2018-12-07 DIAGNOSIS — R42 Dizziness and giddiness: Secondary | ICD-10-CM

## 2018-12-07 DIAGNOSIS — G90A Postural orthostatic tachycardia syndrome (POTS): Secondary | ICD-10-CM

## 2018-12-07 DIAGNOSIS — I498 Other specified cardiac arrhythmias: Secondary | ICD-10-CM

## 2018-12-07 DIAGNOSIS — H608X3 Other otitis externa, bilateral: Secondary | ICD-10-CM | POA: Insufficient documentation

## 2018-12-07 NOTE — Therapy (Signed)
Roberta [name], [place], [address] Phone: [phone]   Fax:  [phone]  Physical Therapy Treatment  Patient Details  Name: Roberta [name] MRN: [id number] Date of Birth: 18-Feb-1991 Referring Provider (PT): Dr. Deland Pretty, Dr. Tresa Garter (Cardiologist)    Encounter Date: 12/07/2018  PT End of Session - 12/07/18 1616    Visit Number  9    Number of Visits  16    Date for PT Re-Evaluation  01/11/19    PT Start Time  1612    PT Stop Time  1655    PT Time Calculation (min)  43 min    Activity Tolerance  Patient tolerated treatment well    Behavior During Therapy  [facility] for tasks assessed/performed       No past medical history on file.  No past surgical history on file.  Vitals:   12/07/18 1627  Pulse: 100    Subjective Assessment - 12/07/18 1614    Subjective  New medication, had muscle cramps and low K. No pain.    Patient Stated Goals  Patient would like to have mroe energy and strength        [facility] Adult PT Treatment/Exercise - 12/07/18 0001      Lumbar Exercises: Machines for Strengthening   Cybex Knee Extension  20 lbs 3 x 10 reps     Cybex Knee Flexion  30 lbs 3 x 10     Cybex Leg Press  2 plates 2 x 20       Lumbar Exercises: Standing   Heel Raises  20 reps    Functional Squats  10 reps      Knee/Hip Exercises: Standing   Forward Step Up  Both;1 set;15 reps;Hand Hold: 0;Step Height: 8"    Forward Step Up Limitations  mild dizzy, HR 130 , decreased to 122 after 1 min       Shoulder Exercises: ROM/Strengthening   Nustep  9 min: 2 min RPE 2-3, 3 min RPE 6-8, 1 min RPE 12-13 and then last 2 min RPE 2-3     Lat Pull  10 reps    Lat Pull Limitations  3 x 10 , 25 lbs     Cybex Press  10 reps    Cybex Press Limitations  4 plates 3 x 10     Cybex Row  10 reps    Cybex Row Limitations  3x 10 , 25 lbs     Other ROM/Strengthening Exercises  HR on machines 130, slightly dizzy, gave rest breaks  as needed , O2 sats 98%             PT Education - 12/07/18 1657    Education Details  standing ex, weight training parameters    Person(s) Educated  Patient    Methods  Explanation    Comprehension  Verbalized understanding       PT Short Term Goals - 12/07/18 1657      PT SHORT TERM GOAL #1   Title  Pt will be consistent and I with HEP for core, large muscle group strengthening    Status  Achieved      PT SHORT TERM GOAL #2   Title  Pt will use RPE and HR monitor (Fitbit) to monitor self while doing cardio    Status  Achieved      PT SHORT TERM GOAL #3   Title  Pt will be able to report feeling less fatigue  post work, able to do simple core work after a few hours of rest.    Baseline  was met then medicine was increased    Status  Partially Met        PT Long Term Goals - 11/16/18 1150      PT LONG TERM GOAL #1   Title  Pt will be able to show Independence with HEP and consistent cardio routine    Status  On-going      PT LONG TERM GOAL #2   Title  Patient will be able to squat, lift up to 15 lbs x 10 with no pain, just min fatigue    Status  Achieved      PT LONG TERM GOAL #3   Title  Pt will be able to see 2 patients at work consecutively without need to rest, document.    Status  On-going      PT LONG TERM GOAL #4   Title  Pt will be able to complete 30 min of cardio without undue fatigue    Status  On-going      PT LONG TERM GOAL #5   Title  Pt will be able to show 5/5 strength in core and LEs    Time  8    Period  Weeks    Status  New    Target Date  01/11/19            Plan - 12/07/18 1624    Clinical Impression Statement  Pt on new medicine for a week. She needs to work more on standing due to limitations at work. Worked on weight machines to get her used to training large muscel groups in her apt. gym.  Vital signs stable although her o2 did decrease to 92% briefly.    PT Treatment/Interventions  ADLs/Self Care Home  Management;Therapeutic activities;Patient/family education;Therapeutic exercise;Other (comment)    PT Next Visit Plan  cont with core,hip strength, try UBE and bike, Reformer.Marland Kitchen STANDING    PT Home Exercise Plan  4 way SLR, calf raise, bridge, supine knee lift (core), bridge with clam, march    Consulted and Agree with Plan of Care  Patient       Patient will benefit from skilled therapeutic intervention in order to improve the following deficits and impairments:  Postural dysfunction, Decreased activity tolerance, Cardiopulmonary status limiting activity, Decreased mobility, Dizziness  Visit Diagnosis: POTS (postural orthostatic tachycardia syndrome)  Dizziness and giddiness     Problem List Patient Active Problem List   Diagnosis Date Noted  . Lymphadenopathy 02/10/2011    Teigen Bellin 12/07/2018, 5:52 PM  [facility] [address] [place], [place], [address] Phone: [phone]   Fax:  [phone]  Name: Roberta [name] MRN: [id number] Date of Birth: 20-Feb-1991  [name], PT 12/07/18 5:52 PM Phone: [phone] Fax: [phone]

## 2018-12-14 ENCOUNTER — Encounter: Payer: Self-pay | Admitting: Physical Therapy

## 2018-12-14 ENCOUNTER — Other Ambulatory Visit: Payer: Self-pay

## 2018-12-14 ENCOUNTER — Ambulatory Visit: Payer: Managed Care, Other (non HMO) | Admitting: Physical Therapy

## 2018-12-14 DIAGNOSIS — R42 Dizziness and giddiness: Secondary | ICD-10-CM

## 2018-12-14 DIAGNOSIS — G90A Postural orthostatic tachycardia syndrome (POTS): Secondary | ICD-10-CM

## 2018-12-14 DIAGNOSIS — I498 Other specified cardiac arrhythmias: Secondary | ICD-10-CM

## 2018-12-14 DIAGNOSIS — R000 Tachycardia, unspecified: Secondary | ICD-10-CM | POA: Diagnosis not present

## 2018-12-14 NOTE — Therapy (Signed)
[place], [place], [address] Phone: [phone]   Fax:  [phone]  Physical Therapy Treatment  Patient Details  Name: Roberta [name] MRN: [id number] Date of Birth: 11-03-1990 Referring Provider (PT): Dr. Deland Pretty, Dr. Tresa Garter (Cardiologist)    Encounter Date: 12/14/2018  PT End of Session - 12/14/18 1457    Visit Number  10    Number of Visits  16    Date for PT Re-Evaluation  01/11/19    PT Start Time  1400    PT Stop Time  1446    PT Time Calculation (min)  46 min    Activity Tolerance  Patient tolerated treatment well    Behavior During Therapy  [facility] for tasks assessed/performed       History reviewed. No pertinent past medical history.  History reviewed. No pertinent surgical history.  There were no vitals filed for this visit.  Subjective Assessment - 12/14/18 1405    Subjective  Arms and hands feel crampy.  Back on medicine HCL dropped K but not on B Blockers.  HR up to 170 at work.  RHR 110, O2 97%        [facility] Adult PT Treatment/Exercise - 12/14/18 0001      Lumbar Exercises: Standing   Wall Slides Limitations  used wall for abdominals post pelvic tilt with exhale x 10, added overhead lift x 10 with circle and lateral/forward raise, goal post x 3 lbs     Medical sales representative;Both    Row Limitations  TRX     Other Standing Lumbar Exercises  TRX: squat, double and single leg , biceps and low row x 10     Other Standing Lumbar Exercises  plank arms overhead added horizontal abd , triceps x 10 max cues for full hip extension          PT Short Term Goals - 12/07/18 1657      PT SHORT TERM GOAL #1   Title  Pt will be consistent and I with HEP for core, large muscle group strengthening    Status  Achieved      PT SHORT TERM GOAL #2   Title  Pt will use RPE and HR monitor (Fitbit) to monitor self while doing cardio    Status  Achieved      PT SHORT TERM GOAL #3   Title  Pt  will be able to report feeling less fatigue post work, able to do simple core work after a few hours of rest.    Baseline  was met then medicine was increased    Status  Partially Met        PT Long Term Goals - 12/14/18 1459      PT LONG TERM GOAL #1   Title  Pt will be able to show Independence with HEP and consistent cardio routine    Baseline  does her workouts in her apt. gym    Status  On-going      PT LONG TERM GOAL #2   Title  Patient will be able to squat, lift up to 15 lbs x 10 with no pain, just min fatigue    Status  Achieved      PT LONG TERM GOAL #3   Title  Pt will be able to see 2 patients at work consecutively without need to rest, document.    Status  On-going      PT LONG TERM GOAL #  4   Title  Pt will be able to complete 30 min of cardio without undue fatigue    Baseline  20 min    Status  On-going      PT LONG TERM GOAL #5   Title  Pt will be able to show 5/5 strength in core and LEs    Baseline  core is weak    Status  On-going            Plan - 12/14/18 1434    Clinical Impression Statement  Pt cont to suffer with tachycardia in standing, noted sway back with fatigue and difficulty maintaining trunk stability.  Used wall for feedback for drawing in abdominals , she may be able to use this as a reset for work.  HR up to 160 with standing, LE work.  Given rest breaks as needed.    PT Treatment/Interventions  ADLs/Self Care Home Management;Therapeutic activities;Patient/family education;Therapeutic exercise;Other (comment)    PT Next Visit Plan  cont with core,hip strength, try UBE and bike, Reformer.Marland Kitchen STANDING    PT Home Exercise Plan  4 way SLR, calf raise, bridge, supine knee lift (core), bridge with clam, march       Patient will benefit from skilled therapeutic intervention in order to improve the following deficits and impairments:  Postural dysfunction, Decreased activity tolerance, Cardiopulmonary status limiting activity, Decreased mobility,  Dizziness  Visit Diagnosis: POTS (postural orthostatic tachycardia syndrome)  Dizziness and giddiness     Problem List Patient Active Problem List   Diagnosis Date Noted  . Lymphadenopathy 02/10/2011    , 12/14/2018, 3:09 PM  [facility] [address] [place], [place], [address] Phone: [phone]   Fax:  [phone]  Name: Roberta [name] MRN: [id number] Date of Birth: December 26, 1990  [name], PT 12/14/18 3:09 PM Phone: [phone] Fax: [phone]

## 2018-12-21 ENCOUNTER — Ambulatory Visit: Payer: Managed Care, Other (non HMO) | Attending: Internal Medicine | Admitting: Physical Therapy

## 2018-12-21 ENCOUNTER — Other Ambulatory Visit: Payer: Self-pay

## 2018-12-21 ENCOUNTER — Encounter: Payer: Self-pay | Admitting: Physical Therapy

## 2018-12-21 DIAGNOSIS — G90A Postural orthostatic tachycardia syndrome (POTS): Secondary | ICD-10-CM

## 2018-12-21 DIAGNOSIS — R42 Dizziness and giddiness: Secondary | ICD-10-CM

## 2018-12-21 DIAGNOSIS — I498 Other specified cardiac arrhythmias: Secondary | ICD-10-CM | POA: Diagnosis present

## 2018-12-21 NOTE — Therapy (Signed)
[place], [place], [address] Phone: [phone]   Fax:  [phone]  Physical Therapy Treatment  Patient Details  Name: Roberta [name] MRN: [id number] Date of Birth: 09/21/1990 Referring Provider (PT): Dr. Deland Pretty, Dr. Tresa Garter (Cardiologist)    Encounter Date: 12/21/2018  PT End of Session - 12/21/18 1544    Visit Number  11    Number of Visits  16    Date for PT Re-Evaluation  01/11/19    PT Start Time  3903    PT Stop Time  1536    PT Time Calculation (min)  51 min    Activity Tolerance  Patient tolerated treatment well    Behavior During Therapy  [facility] for tasks assessed/performed       History reviewed. No pertinent past medical history.  History reviewed. No pertinent surgical history.  There were no vitals filed for this visit.  Subjective Assessment - 12/21/18 1455    Subjective  No new still having increased HR at work in standing. New med though started yesterday.    Currently in Pain?  No/denies       [place] [place] for LE/Core strength, postural strength, lumbopelvic disassociation and core control.  Exercises included:  Push through bar for spinal mobility x 8 , flex, ext  Supine Leg Spring: yellow  Arcs, single and double leg circles, squats   Cues needed for avoiding lumbar hyperextension  [place]: yellow unilateral and bilateral legs in table top and hooklying   Roll down: arm press down x 5 , arm pull x 5 then roll up x 10 with control  Standing Arms yellow flexion, punch yellow spring then slastix for improved ROM (less tension) addressing standing posture and abdominal  Tall kneeling: shoulder ext with roll down bar x 8 Used slastix  Tall kneeling: sideways oblique twist x 8 , slastix      PT Short Term Goals - 12/07/18 1657      PT SHORT TERM GOAL #1   Title  Pt will be consistent and I with HEP for core, large muscle group strengthening    Status   Achieved      PT SHORT TERM GOAL #2   Title  Pt will use RPE and HR monitor (Fitbit) to monitor self while doing cardio    Status  Achieved      PT SHORT TERM GOAL #3   Title  Pt will be able to report feeling less fatigue post work, able to do simple core work after a few hours of rest.    Baseline  was met then medicine was increased    Status  Partially Met        PT Long Term Goals - 12/14/18 1459      PT LONG TERM GOAL #1   Title  Pt will be able to show Independence with HEP and consistent cardio routine    Baseline  does her workouts in her apt. gym    Status  On-going      PT LONG TERM GOAL #2   Title  Patient will be able to squat, lift up to 15 lbs x 10 with no pain, just min fatigue    Status  Achieved      PT LONG TERM GOAL #3   Title  Pt will be able to see 2 patients at work consecutively without need to rest, document.    Status  On-going  PT LONG TERM GOAL #4   Title  Pt will be able to complete 30 min of cardio without undue fatigue    Baseline  20 min    Status  On-going      PT LONG TERM GOAL #5   Title  Pt will be able to show 5/5 strength in core and LEs    Baseline  core is weak    Status  On-going            Plan - 12/21/18 1457    Clinical Impression Statement  Patient worked on [employer], core strength addressed thoroughout.  She leans posteriorly in sway back when fatigued, needs cues for this throughout session. HR more tolerable as we were supine for >50% of the session. Does need to work more on standing and lifting loads to challenge cardiovascular system.    PT Treatment/Interventions  ADLs/Self Care Home Management;Therapeutic activities;Patient/family education;Therapeutic exercise;Other (comment)    PT Next Visit Plan  lifting, squatting, cardio prior    PT Home Exercise Plan  4 way SLR, calf raise, bridge, supine knee lift (core), bridge with clam, march    Consulted and Agree with Plan of Care  Patient       Patient  will benefit from skilled therapeutic intervention in order to improve the following deficits and impairments:  Postural dysfunction, Decreased activity tolerance, Cardiopulmonary status limiting activity, Decreased mobility, Dizziness  Visit Diagnosis: POTS (postural orthostatic tachycardia syndrome)  Dizziness and giddiness     Problem List Patient Active Problem List   Diagnosis Date Noted  . Lymphadenopathy 02/10/2011    Arthor Gorter 12/21/2018, 3:49 PM  [place] [facility] [address] [place], [place], [address] Phone: [phone]   Fax:  [phone]  Name: Roberta [name] MRN: [id number] Date of Birth: 1990/08/11  [name], PT 12/21/18 3:51 PM Phone: [phone] Fax: [phone]

## 2018-12-28 ENCOUNTER — Other Ambulatory Visit: Payer: Self-pay

## 2018-12-28 ENCOUNTER — Ambulatory Visit: Payer: Managed Care, Other (non HMO) | Admitting: Physical Therapy

## 2018-12-28 ENCOUNTER — Encounter: Payer: Self-pay | Admitting: Physical Therapy

## 2018-12-28 DIAGNOSIS — I498 Other specified cardiac arrhythmias: Secondary | ICD-10-CM

## 2018-12-28 DIAGNOSIS — R42 Dizziness and giddiness: Secondary | ICD-10-CM

## 2018-12-28 DIAGNOSIS — G90A Postural orthostatic tachycardia syndrome (POTS): Secondary | ICD-10-CM

## 2018-12-28 NOTE — Therapy (Signed)
[place], [place], [address] Phone: [phone]   Fax:  [phone]  Physical Therapy Treatment  Patient Details  Name: Roberta [name] MRN: [id number] Date of Birth: Jan 25, 1991 Referring Provider (PT): Dr. Deland Pretty, Dr. Tresa Garter (Cardiologist)    Encounter Date: 12/28/2018  PT End of Session - 12/28/18 1438    Visit Number  12    Number of Visits  16    Date for PT Re-Evaluation  01/11/19    PT Start Time  4128    PT Stop Time  1445    PT Time Calculation (min)  50 min    Activity Tolerance  Patient tolerated treatment well    Behavior During Therapy  [facility] for tasks assessed/performed       History reviewed. No pertinent past medical history.  History reviewed. No pertinent surgical history.  There were no vitals filed for this visit.  Subjective Assessment - 12/28/18 1358    Subjective  No pain.  On the max dose of Cardizem.  Feet are swollen.  Diuretic dropped her K.    Currently in Pain?  No/denies         [facility] Adult PT Treatment/Exercise - 12/28/18 0001      Lumbar Exercises: Aerobic   Stationary Bike  8 min RPE max 8, HR up to 157 , added 1 min no tension for cool down , Sa O2 99%       Lumbar Exercises: Standing   Other Standing Lumbar Exercises  standing shoulder flexion x 10 magic circle     Other Standing Lumbar Exercises  used wall for postural training , maintaining core actication with UE exercises: angels, isometric extension and horiztonal abduction x 5, 5 sec       Lumbar Exercises: Quadruped   Single Arm Raise  10 reps    Single Arm Raises Limitations  used large ball pressing hips back into ball on wall     Other Quadruped Lumbar Exercises  bird dog x 5 with cues, very challenging     Other Quadruped Lumbar Exercises  assisted push ups with magic circle x 5    on knees , mod plank      Knee/Hip Exercises: Machines for [employer] and exercise psychologist for  resistance       Knee/Hip Exercises: Standing   Heel Raises  Both;1 set;20 reps    Hip Flexion Limitations  x 10 alternating with 1 plate on [employer]     Forward Lunges  Both;1 set;10 reps    Forward Lunges Limitations  1 plate     Functional Squat  1 set;15 reps    Functional Squat Limitations  HR 170, cues needed       Shoulder Exercises: Standing   Shoulder Elevation  Strengthening;Both;10 reps;Standing    Shoulder Elevation Limitations  freemotion , cues to control lumbar     Other Standing Exercises  bicep curl x 20,  1 plate                PT Short Term Goals - 12/07/18 1657      PT SHORT TERM GOAL #1   Title  Pt will be consistent and I with HEP for core, large muscle group strengthening    Status  Achieved      PT SHORT TERM GOAL #2   Title  Pt will use RPE and HR monitor (Fitbit) to monitor self while doing cardio  Status  Achieved      PT SHORT TERM GOAL #3   Title  Pt will be able to report feeling less fatigue post work, able to do simple core work after a few hours of rest.    Baseline  was met then medicine was increased    Status  Partially Met        PT Long Term Goals - 12/14/18 1459      PT LONG TERM GOAL #1   Title  Pt will be able to show Independence with HEP and consistent cardio routine    Baseline  does her workouts in her apt. gym    Status  On-going      PT LONG TERM GOAL #2   Title  Patient will be able to squat, lift up to 15 lbs x 10 with no pain, just min fatigue    Status  Achieved      PT LONG TERM GOAL #3   Title  Pt will be able to see 2 patients at work consecutively without need to rest, document.    Status  On-going      PT LONG TERM GOAL #4   Title  Pt will be able to complete 30 min of cardio without undue fatigue    Baseline  20 min    Status  On-going      PT LONG TERM GOAL #5   Title  Pt will be able to show 5/5 strength in core and LEs    Baseline  core is weak    Status  On-going            Plan  - 12/28/18 1446    Clinical Impression Statement  Addressed standing and endurance, core strength today due to awareness of poor posture at work when fatigued and HR elevated.  Offered a suggestion that she stagger stance vs a wide stance to avoid leaning posteriorly into her back. Vital signs stable ith HR 150-170 for exercise.    PT Treatment/Interventions  ADLs/Self Care Home Management;Therapeutic activities;Patient/family education;Therapeutic exercise;Other (comment)    PT Next Visit Plan  lifting, squatting, cardio prior    PT Home Exercise Plan  4 way SLR, calf raise, bridge, supine knee lift (core), bridge with clam, march, bird dog    Consulted and Agree with Plan of Care  Patient       Patient will benefit from skilled therapeutic intervention in order to improve the following deficits and impairments:  Postural dysfunction, Decreased activity tolerance, Cardiopulmonary status limiting activity, Decreased mobility, Dizziness  Visit Diagnosis: POTS (postural orthostatic tachycardia syndrome)  Dizziness and giddiness     Problem List Patient Active Problem List   Diagnosis Date Noted  . Lymphadenopathy 02/10/2011    Eirik Schueler 12/28/2018, 2:49 PM  [facility] [address] [place], [place], [address] Phone: [phone]   Fax:  [phone]  Name: Roberta [name] MRN: [id number] Date of Birth: [birth year]  [name], PT 12/28/18 2:49 PM Phone: [phone] Fax: [phone]

## 2018-12-28 NOTE — Patient Instructions (Signed)
Bird Dog Pose - Intermediate    Keep pelvis stable. From table pose, step one leg back to plank pose. Reach opposite arm forward, back foot off floor.  Hold for __5 sec __ breaths. Return arm and leg at same rate. Repeat ___10_ times, alternating sides.  Copyright  VHI. All rights reserved.   

## 2019-01-04 ENCOUNTER — Other Ambulatory Visit: Payer: Self-pay

## 2019-01-04 ENCOUNTER — Ambulatory Visit: Payer: Managed Care, Other (non HMO) | Admitting: Physical Therapy

## 2019-01-04 ENCOUNTER — Encounter: Payer: Self-pay | Admitting: Physical Therapy

## 2019-01-04 DIAGNOSIS — I498 Other specified cardiac arrhythmias: Secondary | ICD-10-CM

## 2019-01-04 DIAGNOSIS — R42 Dizziness and giddiness: Secondary | ICD-10-CM

## 2019-01-04 DIAGNOSIS — G90A Postural orthostatic tachycardia syndrome (POTS): Secondary | ICD-10-CM

## 2019-01-04 NOTE — Therapy (Signed)
[place], [place], [address] Phone: [phone]   Fax:  [phone]  Physical Therapy Treatment  Patient Details  Name: Roberta [name] MRN: [id number] Date of Birth: 25-Aug-1990 Referring Provider (PT): Dr. Deland Pretty, Dr. Tresa Garter (Cardiologist)    Encounter Date: 01/04/2019  PT End of Session - 01/04/19 1354    Visit Number  13    Number of Visits  16    Date for PT Re-Evaluation  01/11/19    PT Start Time  1400    PT Stop Time  1453    PT Time Calculation (min)  53 min    Activity Tolerance  Patient tolerated treatment well    Behavior During Therapy  [facility] for tasks assessed/performed       History reviewed. No pertinent past medical history.  History reviewed. No pertinent surgical history.  There were no vitals filed for this visit.  Subjective Assessment - 01/04/19 1406    Subjective  Waited in line for 2 hours to vote.  I was tired and really feeling my legs, HR elevated. It makes me really nervous.    Currently in Pain?  No/denies         [facility] Adult PT Treatment/Exercise - 01/04/19 0001      Posture/Postural Control   Posture Comments  Worked on standing posture, alignment and core support      Lumbar Exercises: Aerobic   UBE (Upper Arm Bike)  5 min L 5 , HR 130, O2 stable       Lumbar Exercises: Machines for Strengthening   Cybex Knee Extension  25 lbs x 15 , 2 sets     Leg Press  2 plates x 20, calf raise and press, then single lg 1 plate       Knee/Hip Exercises: Standing   Hip Extension  Stengthening;Both;3 sets    Extension Limitations  variations with green band       Manual Therapy   Manual Therapy  Taping    Kinesiotex  Facilitate Muscle      Kinesiotix   Facilitate Muscle   proprioception to prevent leaning back, flaring ribs, 2 vertical strips with trunk sli. flexed adn then 1 horizontal with 50% stretch centrally          PT Education - 01/04/19 1716    Education Details  core support, standing posture and kinesiotape    Person(s) Educated  Patient    Methods  Explanation;Demonstration    Comprehension  Verbalized understanding;Returned demonstration       PT Short Term Goals - 12/07/18 1657      PT SHORT TERM GOAL #1   Title  Pt will be consistent and I with HEP for core, large muscle group strengthening    Status  Achieved      PT SHORT TERM GOAL #2   Title  Pt will use RPE and HR monitor (Fitbit) to monitor self while doing cardio    Status  Achieved      PT SHORT TERM GOAL #3   Title  Pt will be able to report feeling less fatigue post work, able to do simple core work after a few hours of rest.    Baseline  was met then medicine was increased    Status  Partially Met        PT Long Term Goals - 01/04/19 1441      PT LONG TERM GOAL #1   Title  Pt will be able to show Independence with HEP and consistent cardio routine    Status  On-going      PT LONG TERM GOAL #2   Title  Patient will be able to squat, lift up to 15 lbs x 10 with no pain, just min fatigue    Status  Achieved      PT LONG TERM GOAL #3   Title  Pt will be able to see 2 patients at work consecutively without need to rest, document.    Status  Achieved      PT LONG TERM GOAL #4   Title  Pt will be able to complete 30 min of cardio without undue fatigue    Baseline  20 min    Status  On-going      PT LONG TERM GOAL #5   Title  Pt will be able to show 5/5 strength in core and LEs    Status  On-going            Plan - 01/04/19 1354    Clinical Impression Statement  Worked on UE, LE and core strength today, mod intensity.  Used tape for cues on anterior abdominal wall to avoid leaning posteriorly (sway back) when tired.  HR in 140-150's    PT Treatment/Interventions  ADLs/Self Care Home Management;Therapeutic activities;Patient/family education;Therapeutic exercise;Other (comment)    PT Next Visit Plan  lifting, squatting, cardio prior, DC vs  renew    PT Home Exercise Plan  4 way SLR, calf raise, bridge, supine knee lift (core), bridge with clam, march, bird dog    Consulted and Agree with Plan of Care  Patient       Patient will benefit from skilled therapeutic intervention in order to improve the following deficits and impairments:  Postural dysfunction, Decreased activity tolerance, Cardiopulmonary status limiting activity, Decreased mobility, Dizziness  Visit Diagnosis: POTS (postural orthostatic tachycardia syndrome)  Dizziness and giddiness     Problem List Patient Active Problem List   Diagnosis Date Noted  . Lymphadenopathy 02/10/2011    Nataliah Hatlestad 01/04/2019, 5:30 PM  [facility] [address] [place], [place], [address] Phone: [phone]   Fax:  [phone]  Name: Roberta [name] MRN: [id number] Date of Birth: January 26, 1991  [name], PT 01/04/19 5:30 PM Phone: [phone] Fax: [phone]

## 2019-01-11 ENCOUNTER — Other Ambulatory Visit: Payer: Self-pay

## 2019-01-11 ENCOUNTER — Encounter: Payer: Self-pay | Admitting: Physical Therapy

## 2019-01-11 ENCOUNTER — Ambulatory Visit: Payer: Managed Care, Other (non HMO) | Admitting: Physical Therapy

## 2019-01-11 DIAGNOSIS — I498 Other specified cardiac arrhythmias: Secondary | ICD-10-CM | POA: Diagnosis not present

## 2019-01-11 DIAGNOSIS — G90A Postural orthostatic tachycardia syndrome (POTS): Secondary | ICD-10-CM

## 2019-01-11 DIAGNOSIS — R42 Dizziness and giddiness: Secondary | ICD-10-CM

## 2019-01-11 NOTE — Therapy (Signed)
[place], [place], [address] Phone: [phone]   Fax:  [phone]  Physical Therapy Treatment/Renewal  Patient Details  Name: Roberta [name] MRN: [id number] Date of Birth: [birth year] Referring Provider (PT): Dr. Deland Pretty    Encounter Date: 01/11/2019  PT End of Session - 01/11/19 1453    Visit Number  14    Number of Visits  22    Date for PT Re-Evaluation  03/08/19    PT Start Time  9937    PT Stop Time  1528    PT Time Calculation (min)  43 min    Activity Tolerance  Patient tolerated treatment well    Behavior During Therapy  [facility] for tasks assessed/performed       History reviewed. No pertinent past medical history.  History reviewed. No pertinent surgical history.  There were no vitals filed for this visit.  Subjective Assessment - 01/11/19 1451    Subjective  New beta blocker started.  My HR was 160 and BP 160//110.  The beta blocker makes me feel tired and short of breath.  Sees cardiology in 3 weeks.    Currently in Pain?  No/denies         [facility] PT Assessment - 01/11/19 0001      Assessment   Medical Diagnosis  POTS    Referring Provider (PT)  Dr. Deland Pretty     Hand Dominance  Right    Prior Therapy  No       Precautions   Precautions  None      Restrictions   Weight Bearing Restrictions  No      Balance Screen   Has the patient fallen in the past 6 months  No      [place] residence    Living Arrangements  Alone    Type of [place]  One level      Prior Function   Level of Independence  Independent    Vocation  Full time employment    Vocation Requirements  SLP     Leisure  friends, shopping, active       Cognition   Overall Cognitive Status  Within Functional Limits for tasks assessed      Posture/Postural Control   Posture Comments  flares rib cage and pushes pelvis  forward when fatigued      Strength   Right Shoulder Flexion  5/5    Right Shoulder ABduction  4+/5    Left Shoulder Flexion  5/5    Left Shoulder ABduction  4+/5    Right Hand Grip (lbs)  50    Left Hand Grip (lbs)  42    Right Hip Flexion  4+/5    Right Hip Extension  4+/5    Right Hip ADduction  3+/5    Left Hip Flexion  4+/5    Left Hip Extension  4+/5    Left Hip ABduction  4/5    Left Hip ADduction  3+/5    Right Knee Flexion  5/5    Right Knee Extension  5/5    Left Knee Flexion  5/5    Left Knee Extension  5/5         [facility] Adult PT Treatment/Exercise - 01/11/19 0001      Lumbar Exercises: Aerobic   Recumbent Bike  5 min L3  Lumbar Exercises: Quadruped   Plank  modified plank 15 sec x 3 (qped)    poor form with elbow plank      Knee/Hip Exercises: Sidelying   Hip ABduction  Strengthening;Both;1 set;15 reps    Hip ADduction  Strengthening;Both;1 set;15 reps      [employer]   Facilitate Muscle   proprioception to prevent leaning back, flaring ribs, 2 vertical strips with trunk sli. flexed adn then 1 horizontal with 50% stretch centrally             PT Short Term Goals - 01/11/19 1504      PT SHORT TERM GOAL #1   Title  Pt will be consistent and I with HEP for core, large muscle group strengthening    Status  Achieved      PT SHORT TERM GOAL #2   Title  Pt will use RPE and HR monitor (Fitbit) to monitor self while doing cardio    Status  Achieved      PT SHORT TERM GOAL #3   Title  Pt will be able to report feeling less fatigue post work, able to do simple core work after a few hours of rest.    Status  Achieved        PT Long Term Goals - 01/11/19 1504      PT LONG TERM GOAL #1   Title  Pt will be able to show Independence with HEP and consistent cardio routine    Status  On-going      PT LONG TERM GOAL #2   Title  Patient will be able to squat, lift up to 15 lbs x 10 with no pain, just min fatigue    Status  Achieved      PT LONG  TERM GOAL #3   Title  Pt will be able to see 2 patients at work consecutively without need to rest, document.    Status  Achieved      PT LONG TERM GOAL #4   Title  Pt will be able to complete 30 min of cardio without undue fatigue    Baseline  20-25 min    Status  On-going      PT LONG TERM GOAL #5   Title  Pt will be able to show 5/5 strength in core and LEs    Baseline  limbs are at least 1/2 muscle grade stronger, core is 4/5    Status  Partially Met            Plan - 01/11/19 1605    Clinical Impression Statement  Patient re-evaluated today for continuing POC.  She is stronger and can stand more often than prior to PT. She has had several medication changes. She has done a regular HEP and has started Pilates.  She is not able to stand as long as she needs to for work but it is improving.  She will benefit from skilled PT to monitor endurance and progress core and LE strength appropriately.    PT Frequency  1x / week    PT Duration  8 weeks    PT Treatment/Interventions  ADLs/Self Care Home Management;Therapeutic activities;Patient/family education;Therapeutic exercise;Other (comment)    PT Next Visit Plan  lifting, squatting, cardio prior, standing    PT Home Exercise Plan  4 way SLR, calf raise, bridge, supine knee lift (core), bridge with clam, march, bird dog    Consulted and Agree with Plan of Care  Patient  Patient will benefit from skilled therapeutic intervention in order to improve the following deficits and impairments:  Postural dysfunction, Decreased activity tolerance, Cardiopulmonary status limiting activity, Decreased mobility, Dizziness  Visit Diagnosis: POTS (postural orthostatic tachycardia syndrome)  Dizziness and giddiness     Problem List Patient Active Problem List   Diagnosis Date Noted  . Lymphadenopathy 02/10/2011    , 01/11/2019, 5:07 PM  [facility] [address] [place], [place], [address] Phone: [phone]   Fax:  [phone]  Name: Roberta [name] MRN: [id number] Date of Birth: 1990/09/08  [name], PT 01/11/19 5:08 PM Phone: [phone] Fax: [phone]

## 2019-01-11 NOTE — Addendum Note (Signed)
Addended by: [name] L on: 01/11/2019 05:10 PM   Modules accepted: Orders

## 2019-01-18 ENCOUNTER — Ambulatory Visit: Payer: Managed Care, Other (non HMO) | Admitting: Physical Therapy

## 2019-01-25 ENCOUNTER — Ambulatory Visit: Payer: Managed Care, Other (non HMO) | Attending: Internal Medicine | Admitting: Physical Therapy

## 2019-01-25 ENCOUNTER — Other Ambulatory Visit: Payer: Self-pay

## 2019-01-25 ENCOUNTER — Encounter: Payer: Self-pay | Admitting: Physical Therapy

## 2019-01-25 DIAGNOSIS — I498 Other specified cardiac arrhythmias: Secondary | ICD-10-CM | POA: Insufficient documentation

## 2019-01-25 DIAGNOSIS — G90A Postural orthostatic tachycardia syndrome (POTS): Secondary | ICD-10-CM

## 2019-01-25 DIAGNOSIS — R42 Dizziness and giddiness: Secondary | ICD-10-CM | POA: Diagnosis present

## 2019-01-25 NOTE — Therapy (Signed)
[place], [place], [address] Phone: [phone]   Fax:  [phone]  Physical Therapy Treatment  Patient Details  Name: Roberta [name] MRN: [id number] Date of Birth: 08-19-1990 Referring Provider (PT): Dr. Deland [name]    Encounter Date: 01/25/2019  PT End of Session - 01/25/19 1709    Visit Number  15    Number of Visits  22    Date for PT Re-Evaluation  03/08/19    PT Start Time  1703    PT Stop Time  1747    PT Time Calculation (min)  44 min    Activity Tolerance  Patient tolerated treatment well    Behavior During Therapy  [facility] for tasks assessed/performed       History reviewed. No pertinent past medical history.  History reviewed. No pertinent surgical history.  There were no vitals filed for this visit.  Subjective Assessment - 01/25/19 1706    Subjective  Sees Cardiology next week.  HR is a bit better on the new med but still short of breath.    Currently in Pain?  No/denies       [facility] Adult PT Treatment/Exercise - 01/25/19 0001      Pilates   Pilates Mat  Series of 5 x 10 each for more intermediate core       Lumbar Exercises: Aerobic   Stationary Bike  7 min L3 , HR 130       Lumbar Exercises: Standing   Functional Squats  20 reps    Functional Squats Limitations  5 lbs       Lumbar Exercises: Supine   Basic Lumbar Stabilization Limitations  dead bug advanced     Straight Leg Raise  10 reps    Other Supine Lumbar Exercises  foam roller: UE horizongtal abd, overhead reach .        Knee/Hip Exercises: Standing   Side Lunges Limitations  reverse lunge x 10 each side     Functional Squat  2 sets;10 reps    Functional Squat Limitations  KB 5 lbs     Other Standing Knee Exercises  1/2 kneeling 5 lb KB     Other Standing Knee Exercises  forward chest press x 10, rotation x 10 and overhead press x10 with 5 lbs KB          PT Short Term Goals - 01/11/19 1504      PT SHORT TERM  GOAL #1   Title  Pt will be consistent and I with HEP for core, large muscle group strengthening    Status  Achieved      PT SHORT TERM GOAL #2   Title  Pt will use RPE and HR monitor (Fitbit) to monitor self while doing cardio    Status  Achieved      PT SHORT TERM GOAL #3   Title  Pt will be able to report feeling less fatigue post work, able to do simple core work after a few hours of rest.    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    Status  Achieved      PT LONG TERM GOAL #4   Title  Pt will be able to complete 30 min of cardio without undue fatigue    Baseline  20-25 min    Status  On-going      PT LONG TERM GOAL #5   Title  Pt will be able to show 5/5 strength in core and LEs    Baseline  limbs are at least 1/2 muscle grade stronger, core is 4/5    Status  Partially Met            Plan - 01/25/19 1933    Clinical Impression Statement  Patient challenged in supine and in standing for core conditioning.  HR 150's with standing squats. Poor core control in 1/2 kneeling, loses focus easily.  Ankle instability observed with walking and squats.    PT Treatment/Interventions  ADLs/Self Care Home Management;Therapeutic activities;Patient/family education;Therapeutic exercise;Other (comment)    PT Next Visit Plan  lifting, squatting, cardio prior, standing    PT Home Exercise Plan  4 way SLR, calf raise, bridge, supine knee lift (core), bridge with clam, march, bird dog    Consulted and Agree with Plan of Care  Patient       Patient will benefit from skilled  therapeutic intervention in order to improve the following deficits and impairments:  Postural dysfunction, Decreased activity tolerance, Cardiopulmonary status limiting activity, Decreased mobility, Dizziness  Visit Diagnosis: POTS (postural orthostatic tachycardia syndrome)  Dizziness and giddiness     Problem List Patient Active Problem List   Diagnosis Date Noted  . Lymphadenopathy 02/10/2011    PAA,JENNIFER 01/25/2019, 7:38 PM  [facility] [address] [place], [place], [address] Phone: [phone]   Fax:  [phone]  Name: Roberta [name] MRN: [id number] Date of Birth: [birth year]  [name], PT 01/25/19 7:39 PM Phone: [phone] Fax: [phone]

## 2019-02-01 ENCOUNTER — Other Ambulatory Visit: Payer: Self-pay

## 2019-02-01 ENCOUNTER — Encounter: Payer: Self-pay | Admitting: Physical Therapy

## 2019-02-01 ENCOUNTER — Ambulatory Visit: Payer: Managed Care, Other (non HMO) | Admitting: Physical Therapy

## 2019-02-01 DIAGNOSIS — I498 Other specified cardiac arrhythmias: Secondary | ICD-10-CM | POA: Diagnosis not present

## 2019-02-01 DIAGNOSIS — G90A Postural orthostatic tachycardia syndrome (POTS): Secondary | ICD-10-CM

## 2019-02-01 DIAGNOSIS — R42 Dizziness and giddiness: Secondary | ICD-10-CM

## 2019-02-01 NOTE — Therapy (Signed)
[place], [place], [address] Phone: [phone]   Fax:  [phone]  Physical Therapy Treatment  Patient Details  Name: Roberta [name] MRN: [id number] Date of Birth: April 08, 1990 Referring Provider (PT): Dr. Deland Pretty    Encounter Date: 02/01/2019  PT End of Session - 02/01/19 1313    Visit Number  16    Number of Visits  22    Date for PT Re-Evaluation  03/08/19    PT Start Time  1315       History reviewed. No pertinent past medical history.  History reviewed. No pertinent surgical history.  There were no vitals filed for this visit.  Subjective Assessment - 02/01/19 1314    Subjective  Having ECHO 11/24 and a Holter monitor.  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  No pain.    Currently in Pain?  No/denies         [facility] Adult PT Treatment/Exercise - 02/01/19 0001      Lumbar Exercises: Aerobic   Tread Mill  2.5 mph , 3% grade for  5 min HR 123-126 RPE 6 (0-10)     Recumbent Bike  8 min L3       Knee/Hip Exercises: Standing   Heel Raises  Both;1 set;20 reps    Hip Flexion Limitations  compund movement , reverse lunge with opp hip flexion, 5 lbs dumbbell x 10 each     Hip Abduction  Stengthening;Both;1 set;10 reps    Abduction Limitations  foam pad     Hip Extension  Stengthening;Both;1 set;10 reps    Extension Limitations  foam pad     Forward Step Up Limitations  step up and overs 8 inch step 1 Ue assist as needed x 10 each leg     Functional Squat  2 sets;10 reps    Functional Squat Limitations  2 x 8 lb dumbbell       Shoulder Exercises: Standing   Shoulder Elevation Limitations  SLSL with 5 lbs unilateal shoulder press x 10     Other Standing Exercises  shoulder raises dumbell forward and lateal 2 x 10 reps, 5 lbs     Other Standing Exercises  overhead press 4 lbs x 10 , reverse fly x 10                PT Short Term Goals - 01/11/19 1504      PT SHORT TERM  GOAL #1   Title  Pt will be consistent and I with HEP for core, large muscle group strengthening    Status  Achieved      PT SHORT TERM GOAL #2   Title  Pt will use RPE and HR monitor (Fitbit) to monitor self while doing cardio    Status  Achieved      PT SHORT TERM GOAL #3   Title  Pt will be able to report feeling less fatigue post work, able to do simple core work after a few hours of rest.    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    Status  Achieved      PT LONG TERM GOAL #4   Title  Pt will be able to complete 30 min of cardio without undue fatigue    Baseline  20-25 min    Status  On-going      PT LONG TERM GOAL #5   Title  Pt will be able to show 5/5 strength in core and LEs    Baseline  limbs are at least 1/2 muscle grade stronger, core is 4/5    Status  Partially Met            Plan - 02/01/19 1313    Clinical Impression Statement  Patient did well today, worked in standing for all of session, utilizing light dumbbells.  HR 105-130's, mildly dizzy x 2.  Seated rest breaks offered.    PT Treatment/Interventions  ADLs/Self Care Home Management;Therapeutic activities;Patient/family education;Therapeutic exercise;Other (comment)    PT Next Visit Plan  lifting, squatting, cardio prior, standing    PT Home Exercise Plan  4 way SLR, calf raise, bridge, supine knee lift (core), bridge with clam, march, bird dog    Consulted and Agree with Plan of Care  Patient       Patient will benefit from skilled therapeutic intervention in order to improve the  following deficits and impairments:  Postural dysfunction, Decreased activity tolerance, Cardiopulmonary status limiting activity, Decreased mobility, Dizziness  Visit Diagnosis: POTS (postural orthostatic tachycardia syndrome)  Dizziness and giddiness     Problem List Patient Active Problem List   Diagnosis Date Noted  . Lymphadenopathy 02/10/2011    , 02/01/2019, 2:09 PM  [facility] [address] [place], [place], [address] Phone: [phone]   Fax:  [phone]  Name: Roberta [name] MRN: [id number] Date of Birth: January 17, 1991  [name], PT 02/01/19 2:10 PM Phone: [phone] Fax: [phone]

## 2019-02-08 ENCOUNTER — Encounter: Payer: Self-pay | Admitting: Physical Therapy

## 2019-02-08 ENCOUNTER — Other Ambulatory Visit: Payer: Self-pay

## 2019-02-08 ENCOUNTER — Ambulatory Visit: Payer: Managed Care, Other (non HMO) | Admitting: Physical Therapy

## 2019-02-08 DIAGNOSIS — I498 Other specified cardiac arrhythmias: Secondary | ICD-10-CM | POA: Diagnosis not present

## 2019-02-08 DIAGNOSIS — R42 Dizziness and giddiness: Secondary | ICD-10-CM

## 2019-02-08 DIAGNOSIS — G90A Postural orthostatic tachycardia syndrome (POTS): Secondary | ICD-10-CM

## 2019-02-08 NOTE — Therapy (Signed)
[place], [place], [address] Phone: [phone]   Fax:  [phone]  Physical Therapy Treatment  Patient Details  Name: Roberta [name] MRN: [id number] Date of Birth: 29-May-1990 Referring Provider (PT): Dr. Deland Pretty    Encounter Date: 02/08/2019  PT End of Session - 02/08/19 1317    Visit Number  17    Number of Visits  22    Date for PT Re-Evaluation  03/08/19    PT Start Time  1230    PT Stop Time  1315    PT Time Calculation (min)  45 min    Activity Tolerance  Patient tolerated treatment well    Behavior During Therapy  [facility] for tasks assessed/performed       History reviewed. No pertinent past medical history.  History reviewed. No pertinent surgical history.  There were no vitals filed for this visit.  Subjective Assessment - 02/08/19 1231    Subjective  I get my monitor this afternoon.  No complaints, I work tomorrow so they will be able to see what my heart rate really is.    Currently in Pain?  No/denies      [facility] Adult PT Treatment/Exercise - 02/08/19 0001      Pilates   Pilates Mat  "hundreds" showed variations and modification      Lumbar Exercises: Aerobic   UBE (Upper Arm Bike)  6 min L3       Lumbar Exercises: Standing   Functional Squats  20 reps    Functional Squats Limitations  8 lbs each UE , added leg lift last 10     Row  Strengthening;Both;20 reps    Theraband Level (Row)  Level 4 (Blue)    Shoulder Extension  Strengthening;Both;20 reps;Theraband    Theraband Level (Shoulder Extension)  Level 4 (Blue)    Other Standing Lumbar Exercises  Palloff press blue x 15 each side      Lumbar Exercises: Supine   Dead Bug  10 reps    Dead Bug Limitations  5 lbs reverse dead bug     Other Supine Lumbar Exercises  roll down with green theraband      Lumbar Exercises: Quadruped   Plank  modified, on elbows 15 sesc, full plank 16 sec x 2 , unable ot maintain lumbar and hip  position , mod cues       Knee/Hip Exercises: Standing   Heel Raises  Both;1 set;20 reps    Heel Raises Limitations  8 lbs in each hand    Forward Lunges  Both;1 set;10 reps    Forward Lunges Limitations  alternating with 5 lbs     Side Lunges Limitations  reverse lunge x 10 each side    5 lbs      Shoulder Exercises: Standing   Shoulder Elevation Limitations  overhead press 5 lbs x 10 , 2 sets of  all ex     Other Standing Exercises  biceps 8 lbs x 10, upright row 8 lbs x 10     Other Standing Exercises  forward and lateral raise 5 lbs x 10 each                 PT Short Term Goals - 02/08/19 1233      PT SHORT TERM GOAL #1   Title  Pt will be consistent and I with HEP for core, large muscle group strengthening    Status  Achieved  PT SHORT TERM GOAL #2   Title  Pt will use RPE and HR monitor (Fitbit) to monitor self while doing cardio    Status  Achieved      PT SHORT TERM GOAL #3   Title  Pt will be able to report feeling less fatigue post work, able to do simple core work after a few hours of rest.    Status  Achieved        PT Long Term Goals - 02/08/19 1234      PT LONG TERM GOAL #1   Title  Pt will be able to show Independence with HEP and consistent cardio routine    Status  On-going      PT LONG TERM GOAL #2   Title  Patient will be able to squat, lift up to 15 lbs x 10 with no pain, just min fatigue    Status  Achieved      PT LONG TERM GOAL #3   Title  Pt will be able to see 2 patients at work consecutively without need to rest, document.    Status  Achieved      PT LONG TERM GOAL #4   Title  Pt will be able to complete 30 min of cardio without undue fatigue    Status  On-going      PT LONG TERM GOAL #5   Title  Pt will be able to show 5/5 strength in core and LEs            Plan - 02/08/19 1235    Clinical Impression Statement  Pt worked for entire session, marked HR increase as per usualy, HR 147 (upperbody ) to 166 (lower body).   Noted better balance today with leg work (lunges). Core still very weak with planks and hundreds.  Cont POC.    PT Treatment/Interventions  ADLs/Self Care Home Management;Therapeutic activities;Patient/family education;Therapeutic exercise;Other (comment)    PT Next Visit Plan  lifting, squatting, cardio prior, standing, core    PT Home Exercise Plan  4 way SLR, calf raise, bridge, supine knee lift (core), bridge with clam, march, bird dog    Consulted and Agree with Plan of Care  Patient       Patient will benefit from skilled therapeutic intervention in order to improve the following deficits and impairments:  Postural dysfunction, Decreased activity tolerance, Cardiopulmonary status limiting activity, Decreased mobility, Dizziness  Visit Diagnosis: POTS (postural orthostatic tachycardia syndrome)  Dizziness and giddiness     Problem List Patient Active Problem List   Diagnosis Date Noted  . Lymphadenopathy 02/10/2011    Martyna Thorns 02/08/2019, 1:21 PM  [facility] [address] [place], [place], [address] Phone: [phone]   Fax:  [phone]  Name: Roberta [name] MRN: [id number] Date of Birth: 01/19/1991  Karie Mainland, PT 02/08/19 1:21 PM Phone: [phone] Fax: [phone]

## 2019-02-13 ENCOUNTER — Ambulatory Visit: Payer: Managed Care, Other (non HMO) | Admitting: Physical Therapy

## 2019-02-13 ENCOUNTER — Other Ambulatory Visit: Payer: Self-pay

## 2019-02-13 ENCOUNTER — Encounter: Payer: Self-pay | Admitting: Physical Therapy

## 2019-02-13 DIAGNOSIS — R42 Dizziness and giddiness: Secondary | ICD-10-CM

## 2019-02-13 DIAGNOSIS — G90A Postural orthostatic tachycardia syndrome (POTS): Secondary | ICD-10-CM

## 2019-02-13 DIAGNOSIS — I498 Other specified cardiac arrhythmias: Secondary | ICD-10-CM

## 2019-02-13 NOTE — Therapy (Signed)
[facility] [address] [place], [place], [address] Phone: [phone]   Fax:  [phone]  Physical Therapy Treatment  Patient Details  Name: [place] [name] MRN: [id number] Date of Birth: [birth year] Referring Provider (PT): Dr. Merri Brunette    Encounter Date: 02/13/2019  PT End of Session - 02/13/19 1023    Visit Number  18    Number of Visits  22    Date for PT Re-Evaluation  03/08/19    PT Start Time  0915    PT Stop Time  0958    PT Time Calculation (min)  43 min    Activity Tolerance  Patient tolerated treatment well    Behavior During Therapy  [facility] for tasks assessed/performed       History reviewed. No pertinent past medical history.  History reviewed. No pertinent surgical history.  There were no vitals filed for this visit.  Subjective Assessment - 02/13/19 0922    Subjective  No complaints today.    Currently in Pain?  No/denies         [facility] Adult PT Treatment/Exercise - 02/13/19 0001      Pilates   Pilates Reformer  see note         Pilates Reformer used for LE/core strength, postural strength, lumbopelvic disassociation and core control.  Exercises included:  Footwork 2 Red 1 blue used ball between knees for alignment heels x 15, turnout on heels x 15   Bridging 2 Red 1 blue x 10 added press out x 10  Supine Arm work 1 Yellow 1 Red, unable to hold magic circle with legs  Jumpboard HR 107   1 Red 1 Blue: Parallel, turnout, single leg press out x 20 each with rest breaks  Knee stretches 1 Red on elbows x 10  Plank press out   Elephant 1 Red 1 yellow round back x 10  Arabesque 1 red 1 yellow for assistance HR up to 110    Scooter 1 Red press back then add front leg lunge   Ant hip stretch each side  X 3   Manual cues for pelvic neutral throughout session when 1 leg off the ground or surface   PT Short Term Goals - 02/08/19 1233      PT SHORT TERM GOAL #1   Title  Pt will be consistent  and I with HEP for core, large muscle group strengthening    Status  Achieved      PT SHORT TERM GOAL #2   Title  Pt will use RPE and HR monitor (Fitbit) to monitor self while doing cardio    Status  Achieved      PT SHORT TERM GOAL #3   Title  Pt will be able to report feeling less fatigue post work, able to do simple core work after a few hours of rest.    Status  Achieved        PT Long Term Goals - 02/08/19 1234      PT LONG TERM GOAL #1   Title  Pt will be able to show Independence with HEP and consistent cardio routine    Status  On-going      PT LONG TERM GOAL #2   Title  Patient will be able to squat, lift up to 15 lbs x 10 with no pain, just min fatigue    Status  Achieved      PT LONG TERM GOAL #3   Title  Pt will be able to see 2 patients at work consecutively without need to rest, document.    Status  Achieved      PT LONG TERM GOAL #4   Title  Pt will be able to complete 30 min of cardio without undue fatigue    Status  On-going      PT LONG TERM GOAL #5   Title  Pt will be able to show 5/5 strength in core and LEs            Plan - 02/13/19 1024    Clinical Impression Statement  Worked on cardio and strengthening using Pilates equipment.  HR 100-110 bpm with supine, seated and standing.  Needs cues for pelvic position when 1 leg off the surface.  No pain throughout session.    PT Treatment/Interventions  ADLs/Self Care Home Management;Therapeutic activities;Patient/family education;Therapeutic exercise;Other (comment)    PT Next Visit Plan  lifting, squatting, cardio prior, standing, core    PT Home Exercise Plan  4 way SLR, calf raise, bridge, supine knee lift (core), bridge with clam, march, bird dog    Consulted and Agree with Plan of Care  Patient       Patient will benefit from skilled therapeutic intervention in order to improve the following deficits and impairments:  Postural dysfunction, Decreased activity tolerance, Cardiopulmonary status  limiting activity, Decreased mobility, Dizziness  Visit Diagnosis: POTS (postural orthostatic tachycardia syndrome)  Dizziness and giddiness     Problem List Patient Active Problem List   Diagnosis Date Noted  . Lymphadenopathy 02/10/2011    [place] [name] 02/13/2019, 11:12 AM  [facility] [address] [place], [place], [address] Phone: [phone]   Fax:  [phone]  Name: [place] [name] MRN: [id number] Date of Birth: 10/25/1990  [name], PT 02/13/19 11:13 AM Phone: [phone] Fax: [phone]

## 2019-02-22 ENCOUNTER — Other Ambulatory Visit: Payer: Self-pay

## 2019-02-22 ENCOUNTER — Ambulatory Visit: Payer: Managed Care, Other (non HMO) | Attending: Internal Medicine | Admitting: Physical Therapy

## 2019-02-22 DIAGNOSIS — R42 Dizziness and giddiness: Secondary | ICD-10-CM | POA: Diagnosis present

## 2019-02-22 DIAGNOSIS — I498 Other specified cardiac arrhythmias: Secondary | ICD-10-CM | POA: Insufficient documentation

## 2019-02-22 DIAGNOSIS — G90A Postural orthostatic tachycardia syndrome (POTS): Secondary | ICD-10-CM

## 2019-02-22 NOTE — Therapy (Signed)
[place], [place], [address] Phone: [phone]   Fax:  [phone]  Physical Therapy Treatment  Patient Details  Name: [place] [name] MRN: [id number] Date of Birth: 01/17/1991 Referring Provider (PT): Dr. Deland Pretty    Encounter Date: 02/22/2019  PT End of Session - 02/22/19 1312    Visit Number  19    Number of Visits  22    Date for PT Re-Evaluation  03/08/19    PT Start Time  1232    PT Stop Time  1315    PT Time Calculation (min)  43 min    Activity Tolerance  Patient tolerated treatment well    Behavior During Therapy  [facility] for tasks assessed/performed       No past medical history on file.  No past surgical history on file.  There were no vitals filed for this visit.  Subjective Assessment - 02/22/19 1235    Subjective  The monitor is done.  They said I have POTS as well as "Inappropriate sinus tach and the numbers are better since coming here".    Currently in Pain?  No/denies          [facility] Adult PT Treatment/Exercise - 02/22/19 0001      Knee/Hip Exercises: Aerobic   Elliptical  5 min L9 ramp L2 resist.       Knee/Hip Exercises: Standing   Walking with Sports Cord  lateral band walking green 4 x 20 feet, cues for upright posture     Other Standing Knee Exercises  squat with overhead press 1 plate x 10, hip abduction   1 plates x 15, extension 2 plates     Other Standing Knee Exercises  walking lunge 4 x 20 feet , 1 time with stick       Shoulder Exercises: Standing   Flexion  Strengthening;Both;15 reps    Shoulder Flexion Weight (lbs)  1 plate  Freemotion     Extension  Strengthening;Both;15 reps    Extension Weight (lbs)  2 plates     Row  [id number];JIRC;78 reps    Row Weight (lbs)  3 plates    Shoulder Elevation Limitations  overhead press Freemotion 1 plate x 10 cue for core                PT Short Term Goals - 02/22/19 1235      PT SHORT TERM GOAL #1   Title  Pt will be consistent and I with HEP for core, large muscle group strengthening    Status  Achieved      PT SHORT TERM GOAL #2   Title  Pt will use RPE and HR monitor (Fitbit) to monitor self while doing cardio    Status  Achieved      PT SHORT TERM GOAL #3   Title  Pt will be able to report feeling less fatigue post work, able to do simple core work after a few hours of rest.    Status  Achieved        PT Long Term Goals - 02/22/19 1317      PT LONG TERM GOAL #1   Title  Pt will be able to show Independence with HEP and consistent cardio routine    Baseline  gym in her apt is now closed    Status  On-going      PT LONG TERM GOAL #2   Title  Patient will be able to squat,  lift up to 15 lbs x 10 with no pain, just min fatigue    Baseline  this gets tiring at work    Status  On-going      PT [place] #3   Title  Pt will be able to see 2 patients at work consecutively without need to rest, document.    Status  Achieved      PT LONG TERM GOAL #4   Title  Pt will be able to complete 30 min of cardio without undue fatigue    Status  On-going      PT LONG TERM GOAL #5   Title  Pt will be able to show 5/5 strength in core and LEs    Baseline  limbs are at least 1/2 muscle grade stronger, core is 4/5    Status  Partially Met            Plan - 02/22/19 1318    Clinical Impression Statement  Pt HR in standing 120-138 during higher level full body exercises.  She and her cardiologist are pleased with her progress.  Her HR are better even with decreased beta blockers.  Cont POC and work more on interval training (stairs, Elliptical)    Personal Factors and Comorbidities  Time since onset of injury/illness/exacerbation;Other    Examination-Activity Limitations  Stand;Locomotion Level;Other    Examination-Participation Restrictions  Community Activity    Stability/Clinical Decision Making  Evolving/Moderate complexity    Rehab Potential  Excellent    PT Frequency   1x / week    PT Duration  8 weeks    PT Treatment/Interventions  ADLs/Self Care Home Management;Therapeutic activities;Patient/family education;Therapeutic exercise;Other (comment)    PT Next Visit Plan  lifting, squatting, cardio prior, standing, core. INTERVALS    PT Home Exercise Plan  4 way SLR, calf raise, bridge, supine knee lift (core), bridge with clam, march, bird dog    Consulted and Agree with Plan of Care  Patient       Patient will benefit from skilled therapeutic intervention in order to improve the following deficits and impairments:  Postural dysfunction, Decreased activity tolerance, Cardiopulmonary status limiting activity, Decreased mobility, Dizziness  Visit Diagnosis: POTS (postural orthostatic tachycardia syndrome)  Dizziness and giddiness     Problem List Patient Active Problem List   Diagnosis Date Noted  . Lymphadenopathy 02/10/2011    Antonis Lor 02/22/2019, 1:22 PM  [facility] [address] [place], [place], [address] Phone: [phone]   Fax:  [phone]  Name: [place]ta [name] MRN: [id number] Date of Birth: [birth year]  [name], PT 02/22/19 1:22 PM Phone: [phone] Fax: [phone]

## 2019-03-01 ENCOUNTER — Encounter

## 2019-03-06 ENCOUNTER — Encounter: Payer: Managed Care, Other (non HMO) | Admitting: Physical Therapy

## 2019-03-08 ENCOUNTER — Ambulatory Visit: Payer: Managed Care, Other (non HMO) | Admitting: Physical Therapy

## 2019-03-08 ENCOUNTER — Other Ambulatory Visit: Payer: Self-pay

## 2019-03-08 ENCOUNTER — Encounter: Payer: Self-pay | Admitting: Physical Therapy

## 2019-03-08 DIAGNOSIS — I498 Other specified cardiac arrhythmias: Secondary | ICD-10-CM | POA: Diagnosis not present

## 2019-03-08 DIAGNOSIS — R42 Dizziness and giddiness: Secondary | ICD-10-CM

## 2019-03-08 DIAGNOSIS — G90A Postural orthostatic tachycardia syndrome (POTS): Secondary | ICD-10-CM

## 2019-03-08 NOTE — Addendum Note (Signed)
Addended by: [name] L on: 03/08/2019 06:00 PM   Modules accepted: Orders

## 2019-03-08 NOTE — Therapy (Signed)
[facility] [address] [place], [place], [address] Phone: [phone]   Fax:  [phone]  Physical Therapy Treatment  Patient Details  Name: [place]ta [name] MRN: [id number] Date of Birth: October 19, 1990 Referring Provider (PT): Dr. Merri Brunette    Encounter Date: 03/08/2019  PT End of Session - 03/08/19 1542    Visit Number  20    Number of Visits  32    Date for PT Re-Evaluation  04/19/19    PT Start Time  1533    PT Stop Time  1616    PT Time Calculation (min)  43 min    Activity Tolerance  Patient tolerated treatment well    Behavior During Therapy  [facility] for tasks assessed/performed       History reviewed. No pertinent past medical history.  History reviewed. No pertinent surgical history.  There were no vitals filed for this visit.  Subjective Assessment - 03/08/19 1539    Subjective  No complaints I have been doing HEP at home.  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  More tired at the end of the day.    Currently in Pain?  No/denies         [facility] Adult PT Treatment/Exercise - 03/08/19 0001      Lumbar Exercises: Machines for Strengthening   Cybex Knee Extension  25 lbs x 10 , then 20 x 10       Lumbar Exercises: Standing   Other Standing Lumbar Exercises  squat and press 4 lbs each arm x 10     Other Standing Lumbar Exercises  on toes, lateral/forward raise 4 lbs x 8 each side       Knee/Hip Exercises: Aerobic   Stationary Bike  8 min L2-3       Knee/Hip Exercises: Standing   Hip Abduction  Stengthening;Both;1 set;15 reps    Lateral Step Up  Both;1 set;20 reps;Hand Hold: 0    Lateral Step Up Limitations  8 inch     Forward Step Up  Both;1 set;20 reps;Hand Hold: 0;Step Height: 8"    Forward Step Up Limitations  no rest in between     Functional Squat  1 set;20 reps    Functional Squat Limitations  15 lbs KB then 15 reps wide plie squat     Other Standing Knee  Exercises  split lunge with bicep curls 4 lbs x 10     Other Standing Knee Exercises  narrow squat triceps ext 4 lbs x 10          PT Short Term Goals - 02/22/19 1235      PT SHORT TERM GOAL #1   Title  Pt will be consistent and I with HEP for core, large muscle group strengthening    Status  Achieved      PT SHORT TERM GOAL #2   Title  Pt will use RPE and HR monitor (Fitbit) to monitor self while doing cardio    Status  Achieved      PT SHORT TERM GOAL #3   Title  Pt will be able to report feeling less fatigue post work, able to do simple core work after a few hours of rest.    Status  Achieved        PT Long Term Goals - 03/08/19 1551      PT LONG TERM GOAL #1   Title  Pt will be able  to show Independence with HEP and consistent cardio routine    Baseline  gym in her apt is now closed    Status  On-going      PT LONG TERM GOAL #2   Title  Patient will be able to squat, lift up to 15 lbs x 10 with no pain, just min fatigue    Baseline  mod fatigue    Status  On-going      PT LONG TERM GOAL #3   Title  Pt will be able to see 2 patients at work consecutively without need to rest, document.    Status  Achieved      PT LONG TERM GOAL #4   Title  Pt will be able to complete 30 min of cardio without undue fatigue    Baseline  20-25 min    Status  On-going      PT LONG TERM GOAL #5   Title  Pt will be able to show 5/5 strength in core and LEs    Baseline  core 4/5, glute med weakness in SLS      PT LONG TERM GOAL #6   Title  Pt will be able to take 2 flights of stairs to access home, work multiple floors.    Time  6    Period  Weeks    Status  New    Target Date  04/19/19            Plan - 03/08/19 1554    Clinical Impression Statement  Pt remained at 120 bpm and stood for the entire session.  She is still unable to take the stairs at work due to fatigue.  She has poor posture and leans posteriorly, hips push forward.  Has mild [place] in standing  bilateral hips. She will benefit from PT to further improvement in endurance and tolerance for work activity.    PT Treatment/Interventions  ADLs/Self Care Home Management;Therapeutic activities;Patient/family education;Therapeutic exercise;Other (comment)    PT Next Visit Plan  lifting, squatting, cardio prior, standing, core. INTERVALS    PT Home Exercise Plan  4 way SLR, calf raise, bridge, supine knee lift (core), bridge with clam, march, bird dog    Consulted and Agree with Plan of Care  Patient       Patient will benefit from skilled therapeutic intervention in order to improve the following deficits and impairments:  Postural dysfunction, Decreased activity tolerance, Cardiopulmonary status limiting activity, Decreased mobility, Dizziness  Visit Diagnosis: POTS (postural orthostatic tachycardia syndrome)  Dizziness and giddiness     Problem List Patient Active Problem List   Diagnosis Date Noted  . Lymphadenopathy 02/10/2011    Shown Dissinger 03/08/2019, 5:56 PM  [facility] [address] [place], [place], [address] Phone: [phone]   Fax:  [phone]  Name: [place]ta [name] MRN: [id number] Date of Birth: 1990/06/30  [name], PT 03/08/19 5:56 PM Phone: [phone] Fax: [phone]

## 2019-03-09 NOTE — Patient Instructions (Signed)
Access Code: [email or web address]  URL: [email or web address]  Date: 03/09/2019  Prepared by: [name]   Exercises Lateral Lunge - 10 reps - 2 sets - 5 hold - 1x daily - 7x weekly Squatting Anti-Rotation Press - 10 reps - 2 sets - 5 hold - 1x daily - 7x weekly Forward T - 10 reps - 2 sets - 5 hold - 1x daily - 7x weekly

## 2019-03-13 ENCOUNTER — Ambulatory Visit: Payer: Managed Care, Other (non HMO) | Admitting: Physical Therapy

## 2019-03-13 ENCOUNTER — Other Ambulatory Visit: Payer: Self-pay

## 2019-03-13 DIAGNOSIS — I498 Other specified cardiac arrhythmias: Secondary | ICD-10-CM

## 2019-03-13 DIAGNOSIS — G90A Postural orthostatic tachycardia syndrome (POTS): Secondary | ICD-10-CM

## 2019-03-13 DIAGNOSIS — R42 Dizziness and giddiness: Secondary | ICD-10-CM

## 2019-03-13 NOTE — Therapy (Signed)
[place], [place], [address] Phone: [phone]   Fax:  [phone]  Physical Therapy Treatment  Patient Details  Name: Roberta [name] MRN: [id number] Date of Birth: 1990-08-06 Referring Provider (PT): Dr. Deland Pretty    Encounter Date: 03/13/2019  PT End of Session - 03/13/19 0922    Visit Number  21    Number of Visits  32    Date for PT Re-Evaluation  04/19/19    PT Start Time  0914    PT Stop Time  0957    PT Time Calculation (min)  43 min    Activity Tolerance  Patient tolerated treatment well    Behavior During Therapy  [facility] for tasks assessed/performed       No past medical history on file.  No past surgical history on file.  There were no vitals filed for this visit.      [facility] Adult PT Treatment/Exercise - 03/13/19 0001      Lumbar Exercises: Aerobic   Elliptical  6 min L1 resist.  L 10 ramp       Lumbar Exercises: Standing   Other Standing Lumbar Exercises  EMOM x 10 min: Med ball rotation x 6, 10 lb KB squat and overhead press 5 lbs dumbbells  HR 120-131      Lumbar Exercises: Quadruped   Opposite Arm/Leg Raise  --    Plank  mod plank x 30 sec       Knee/Hip Exercises: Supine   Bridges Limitations  x 10     Straight Leg Raises Limitations  x 10 holding 10 lbs for stability in UEs     Other Supine Knee/Hip Exercises  modified dead bug with 10 lbs (single leg)       Knee/Hip Exercises: Sidelying   Hip ABduction Limitations  x 10  then sidekicks and circles x 10     Clams  x 10                PT Short Term Goals - 02/22/19 1235      PT SHORT TERM GOAL #1   Title  Pt will be consistent and I with HEP for core, large muscle group strengthening    Status  Achieved      PT SHORT TERM GOAL #2   Title  Pt will use RPE and HR monitor (Fitbit) to monitor self while doing cardio    Status  Achieved      PT SHORT TERM GOAL #3   Title  Pt will be able to report feeling  less fatigue post work, able to do simple core work after a few hours of rest.    Status  Achieved        PT Long Term Goals - 03/08/19 1551      PT LONG TERM GOAL #1   Title  Pt will be able to show Independence with HEP and consistent cardio routine    Baseline  gym in her apt is now closed    Status  On-going      PT LONG TERM GOAL #2   Title  Patient will be able to squat, lift up to 15 lbs x 10 with no pain, just min fatigue    Baseline  mod fatigue    Status  On-going      PT LONG TERM GOAL #3   Title  Pt will be able to see 2 patients at work consecutively without  need to rest, document.    Status  Achieved      PT LONG TERM GOAL #4   Title  Pt will be able to complete 30 min of cardio without undue fatigue    Baseline  20-25 min    Status  On-going      PT LONG TERM GOAL #5   Title  Pt will be able to show 5/5 strength in core and LEs    Baseline  core 4/5, glute med weakness in SLS      PT LONG TERM GOAL #6   Title  Pt will be able to take 2 flights of stairs to access home, work multiple floors.    Time  6    Period  Weeks    Status  New    Target Date  04/19/19            Plan - 03/13/19 1004    Clinical Impression Statement  Patient completed cardio and a mod level EMOM for LE, UE and core strength with HR in 120-130's.  RPE 7-8.  Continues to improve and feel better with standing work activities.    PT Treatment/Interventions  ADLs/Self Care Home Management;Therapeutic activities;Patient/family education;Therapeutic exercise;Other (comment)    PT Next Visit Plan  lifting, squatting, cardio prior, standing, core. INTERVALS, EMOM with step ups    PT Home Exercise Plan  4 way SLR, calf raise, bridge, supine knee lift (core), bridge with clam, march, bird dog    Consulted and Agree with Plan of Care  Patient       Patient will benefit from skilled therapeutic intervention in order to improve the following deficits and impairments:  Postural dysfunction,  Decreased activity tolerance, Cardiopulmonary status limiting activity, Decreased mobility, Dizziness  Visit Diagnosis: POTS (postural orthostatic tachycardia syndrome)  Dizziness and giddiness     Problem List Patient Active Problem List   Diagnosis Date Noted  . Lymphadenopathy 02/10/2011    Braylin Xu 03/13/2019, 10:09 AM  [facility] [address] [place], [place], [address] Phone: [phone]   Fax:  [phone]  Name: [place]ta [name] MRN: [id number] Date of Birth: 01/18/1991  Karie Mainland, PT 03/13/19 10:09 AM Phone: [phone] Fax: [phone]

## 2019-03-29 ENCOUNTER — Other Ambulatory Visit: Payer: Self-pay

## 2019-03-29 ENCOUNTER — Ambulatory Visit: Payer: Managed Care, Other (non HMO) | Attending: Internal Medicine | Admitting: Physical Therapy

## 2019-03-29 ENCOUNTER — Encounter: Payer: Self-pay | Admitting: Physical Therapy

## 2019-03-29 DIAGNOSIS — I498 Other specified cardiac arrhythmias: Secondary | ICD-10-CM | POA: Diagnosis present

## 2019-03-29 DIAGNOSIS — R42 Dizziness and giddiness: Secondary | ICD-10-CM

## 2019-03-29 DIAGNOSIS — G90A Postural orthostatic tachycardia syndrome (POTS): Secondary | ICD-10-CM

## 2019-03-29 NOTE — Therapy (Signed)
[facility] Outpatient Rehabilitation [facility] [address] [place], [place], [address] Phone: [phone]   Fax:  [phone]  Physical Therapy Treatment  Patient Details  Name: MARGUERITTE GUTHRIDGE MRN: [id number] Date of Birth: 02/18/1991 Referring Provider (PT): Dr. Merri Brunette    Encounter Date: 03/29/2019  PT End of Session - 03/29/19 1426    Visit Number  22    Number of Visits  32    Date for PT Re-Evaluation  04/19/19    PT Start Time  1229    PT Stop Time  1314    PT Time Calculation (min)  45 min    Activity Tolerance  Patient tolerated treatment well    Behavior During Therapy  [facility] for tasks assessed/performed       History reviewed. No pertinent past medical history.  History reviewed. No pertinent surgical history.  There were no vitals filed for this visit.  Subjective Assessment - 03/29/19 1230    Subjective  I will be seen at the [facility] clinic for cardiologist/POTS clinic.  Has been working out a little bit, has been working [employer].    Currently in Pain?  No/denies         [facility] Adult PT Treatment/Exercise - 03/29/19 0001      Lumbar Exercises: Aerobic   Elliptical  8 min L 1 resist and L 10 ramp     Other Aerobic Exercise  split squat static lunge with triceps kick back and flkex/ext shoudler       Lumbar Exercises: Standing   Other Standing Lumbar Exercises  EMOM x 12: squats x 10 with 10 lbs dumbbells and shoulder taps in countertop plank.  Initiallly did plank walk out but it was too much elevation change       Knee/Hip Exercises: Standing   Other Standing Knee Exercises  1/2 kneeling on BOSU with overhead press 6 lbs x 10 each x 2     Other Standing Knee Exercises  tall kneeling on  BOSU with single arm 3 lbs x 10 and then forward press out x 6 lbs x 10 x 2                PT Short Term Goals - 02/22/19 1235      PT SHORT TERM GOAL #1   Title  Pt will be consistent and I with HEP for core, large muscle group strengthening    Status  Achieved      PT SHORT TERM GOAL #2   Title  Pt will use RPE and HR monitor (Fitbit) to monitor self while doing cardio    Status  Achieved      PT SHORT TERM GOAL #3   Title  Pt will be able to report feeling less fatigue post work, able to do simple core work after a few hours of rest.    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    Status  Achieved      PT LONG TERM GOAL #4   Title  Pt will be able to complete 30 min of cardio without undue fatigue    Baseline  20-25 min    Status  On-going      PT LONG TERM GOAL #5   Title  Pt will be able to show 5/5 strength in core and LEs    Baseline  core 4/5, glute med weakness in SLS      PT LONG TERM GOAL #6   Title  Pt will be able to take 2 flights of stairs to access home, work multiple floors.    Time  6    Period  Weeks    Status  New    Target Date  04/19/19            Plan - 03/29/19 1256    Clinical Impression Statement  Pt repeated EMOM for 12 min with HR 130-140's RPE was 15. Tall kneeling on BOSU was effective for core challenge, worked on shoulder flexion without lumbar extension    PT Treatment/Interventions  ADLs/Self Care Home Management;Therapeutic activities;Patient/family education;Therapeutic exercise;Other (comment)    PT Next Visit Plan  lifting, squatting, cardio prior, standing, core. INTERVALS, EMOM with step ups    PT Home Exercise Plan  4 way SLR, calf raise, bridge, supine knee lift (core), bridge with clam,  march, bird dog    Consulted and Agree with Plan of Care  Patient       Patient will benefit from skilled therapeutic intervention in order to improve the following deficits and impairments:  Postural dysfunction, Decreased activity tolerance, Cardiopulmonary status limiting activity, Decreased mobility, Dizziness  Visit Diagnosis: POTS (postural orthostatic tachycardia syndrome)  Dizziness and giddiness     Problem List Patient Active Problem List   Diagnosis Date Noted  . Lymphadenopathy 02/10/2011    Ivanell Deshotel 03/29/2019, 2:32 PM  [facility] [address] [place], [place], [address] Phone: [phone]   Fax:  [phone]  Name: INELL MIMBS MRN: [id number] Date of Birth: January 22, 1991  [name], PT 03/29/19 2:33 PM Phone: [phone] Fax: [phone]

## 2019-04-05 ENCOUNTER — Ambulatory Visit: Payer: Managed Care, Other (non HMO) | Admitting: Physical Therapy

## 2019-04-05 ENCOUNTER — Other Ambulatory Visit: Payer: Self-pay

## 2019-04-05 DIAGNOSIS — R42 Dizziness and giddiness: Secondary | ICD-10-CM

## 2019-04-05 DIAGNOSIS — I498 Other specified cardiac arrhythmias: Secondary | ICD-10-CM | POA: Diagnosis not present

## 2019-04-05 DIAGNOSIS — G90A Postural orthostatic tachycardia syndrome (POTS): Secondary | ICD-10-CM

## 2019-04-05 NOTE — Therapy (Signed)
[place], [place], [address] Phone: [phone]   Fax:  [phone]  Physical Therapy Treatment  Patient Details  Name: Roberta [name] MRN: [id number] Date of Birth: January 27, 1991 Referring Provider (PT): Dr. Deland Pretty    Encounter Date: 04/05/2019  PT End of Session - 04/05/19 1322    Visit Number  23    Number of Visits  32    Date for PT Re-Evaluation  04/19/19    PT Start Time  1319    PT Stop Time  1404    PT Time Calculation (min)  45 min    Activity Tolerance  Patient tolerated treatment well    Behavior During Therapy  [facility] for tasks assessed/performed       No past medical history on file.  No past surgical history on file.  There were no vitals filed for this visit.  Subjective Assessment - 04/05/19 1326    Subjective  No new complaints.  Got her 2nd [place] yesterday.  Arm is sore.         [facility] PT Assessment - 04/05/19 0001      Strength   Right Hip ABduction  4+/5    Left Hip ABduction  4+/5         [facility] Adult PT Treatment/Exercise - 04/05/19 0001      Lumbar Exercises: Standing   Other Standing Lumbar Exercises  EMOM x 15 min: 15 lbs KB squats, 6 step ups , 6 countertop plank supermans    HR round 3: 125 and Sa O2 98%      Knee/Hip Exercises: Aerobic   Stationary Bike  8 min L2 RPE 11-12       Knee/Hip Exercises: Sidelying   Hip ABduction  Strengthening;Both;1 set;10 reps    Hip ADduction  Strengthening;Both;1 set;10 reps    Other Sidelying Knee/Hip Exercises  knee extension with hip abduction, x 10, circles x 10 x 2           PT Short Term Goals - 02/22/19 1235      PT SHORT TERM GOAL #1   Title  Pt will be consistent and I with HEP for core, large muscle group strengthening    Status  Achieved      PT SHORT TERM GOAL #2   Title  Pt will use RPE and HR monitor (Fitbit) to monitor self while doing cardio    Status  Achieved      PT SHORT TERM GOAL #3    Title  Pt will be able to report feeling less fatigue post work, able to do simple core work after a few hours of rest.    Status  Achieved        PT Long Term Goals - 04/05/19 1353      PT LONG TERM GOAL #1   Title  Pt will be able to show Independence with HEP and consistent cardio routine    Status  On-going      PT LONG TERM GOAL #2   Title  Patient will be able to squat, lift up to 15 lbs x 10 with no pain, just min fatigue    Status  Achieved      PT LONG TERM GOAL #3   Title  Pt will be able to see 2 patients at work consecutively without need to rest, document.    Status  Achieved      PT LONG TERM GOAL #4  Title  Pt will be able to complete 30 min of cardio without undue fatigue    Status  On-going      PT LONG TERM GOAL #5   Title  Pt will be able to show 5/5 strength in core and LEs      PT LONG TERM GOAL #6   Title  Pt will be able to take 2 flights of stairs to access home, work multiple floors.    Status  On-going            Plan - 04/05/19 1328    Clinical Impression Statement  Progressed to a 15 min EMOM today.  Step ups are particularly challenging.  HR 125-130 during peak of aerobic activity. Hip abd strength improved bilaterally.    PT Treatment/Interventions  ADLs/Self Care Home Management;Therapeutic activities;Patient/family education;Therapeutic exercise;Other (comment)    PT Next Visit Plan  lifting, squatting, cardio prior, standing, core. INTERVALS, EMOM with step ups    PT Home Exercise Plan  4 way SLR, calf raise, bridge, supine knee lift (core), bridge with clam, march, bird dog       Patient will benefit from skilled therapeutic intervention in order to improve the following deficits and impairments:  Postural dysfunction, Decreased activity tolerance, Cardiopulmonary status limiting activity, Decreased mobility, Dizziness  Visit Diagnosis: POTS (postural orthostatic tachycardia syndrome)  Dizziness and giddiness     Problem  List Patient Active Problem List   Diagnosis Date Noted  . Lymphadenopathy 02/10/2011    Perrie Ragin 04/05/2019, 5:32 PM  [facility] Outpatient Rehabilitation [facility] [address] [place], [place], [address] Phone: [phone]   Fax:  [phone]  Name: Roberta [name] MRN: [id number] Date of Birth: 04-05-1990  Karie Mainland, PT 04/05/19 5:32 PM Phone: [phone] Fax: [phone]

## 2019-04-12 ENCOUNTER — Ambulatory Visit: Payer: Managed Care, Other (non HMO) | Admitting: Physical Therapy

## 2019-04-12 ENCOUNTER — Other Ambulatory Visit: Payer: Self-pay

## 2019-04-12 DIAGNOSIS — I498 Other specified cardiac arrhythmias: Secondary | ICD-10-CM

## 2019-04-12 DIAGNOSIS — G90A Postural orthostatic tachycardia syndrome (POTS): Secondary | ICD-10-CM

## 2019-04-12 DIAGNOSIS — R42 Dizziness and giddiness: Secondary | ICD-10-CM

## 2019-04-12 NOTE — Therapy (Signed)
[facility] Outpatient Rehabilitation [facility] [address] [place], [place], [address] Phone: [phone]   Fax:  [phone]  Physical Therapy Treatment  Patient Details  Name: Roberta [name] MRN: [id number] Date of Birth: 1991-01-15 Referring Provider (PT): Dr. Merri Brunette    Encounter Date: 04/12/2019  PT End of Session - 04/12/19 1328    Visit Number  24    Number of Visits  32    Date for PT Re-Evaluation  04/19/19    PT Start Time  1312    PT Stop Time  1400    PT Time Calculation (min)  48 min    Activity Tolerance  Patient tolerated treatment well    Behavior During Therapy  [facility] for tasks assessed/performed       No past medical history on file.  No past surgical history on file.  There were no vitals filed for this visit.  Subjective Assessment - 04/12/19 1321    Subjective  NO complaints had a allergy test this AM.  I want to try those plank things again.           [facility] Adult PT Treatment/Exercise - 04/12/19 0001      Lumbar Exercises: Aerobic   UBE (Upper Arm Bike)  5 min L2 cues for posture      Lumbar Exercises: Standing   Shoulder Adduction Limitations  plank walk out x 5 too dizzy     Other Standing Lumbar Exercises  surrenders 3 sets of 6 each leg     Other Standing Lumbar Exercises  TRX deep squat x 20, add knee lift alternating       Lumbar Exercises: Supine   Bridge  15 reps    Bridge Limitations  ball under calves     Bridge with [employer] Limitations  ball with hamstring curl x 5       Lumbar Exercises: Sidelying   Other Sidelying Lumbar Exercises  sideplank 10 sec x3 each side, knees bent       Lumbar Exercises: Quadruped   Opposite Arm/Leg Raise Limitations  superman on ball x 10 each arm/leg     Plank  modified ball plank, 15 sec x 3       Knee/Hip Exercises: Standing   Other Standing Knee Exercises  curtsy squat x 10 each side, then single leg (modified)              PT Education - 04/12/19 1348    Education Details  form with exercise,  working on elevation changes to challenge CV work    [employer]) Educated  Patient    Methods  Explanation;Demonstration    Comprehension  Verbalized understanding;Returned demonstration       PT Short Term Goals - 02/22/19 1235      PT SHORT TERM GOAL #1   Title  Pt will be consistent and I with HEP for core, large muscle group strengthening    Status  Achieved      PT SHORT TERM GOAL #2   Title  Pt will use RPE and HR monitor (Fitbit) to monitor self while doing cardio    Status  Achieved      PT SHORT TERM GOAL #3   Title  Pt will be able to report feeling less fatigue post work, able to do simple core work after a few hours of rest.    Status  Achieved        PT Long Term Goals - 04/05/19 1353  PT LONG TERM GOAL #1   Title  Pt will be able to show Independence with HEP and consistent cardio routine    Status  On-going      PT LONG TERM GOAL #2   Title  Patient will be able to squat, lift up to 15 lbs x 10 with no pain, just min fatigue    Status  Achieved      PT LONG TERM GOAL #3   Title  Pt will be able to see 2 patients at work consecutively without need to rest, document.    Status  Achieved      PT LONG TERM GOAL #4   Title  Pt will be able to complete 30 min of cardio without undue fatigue    Status  On-going      PT LONG TERM GOAL #5   Title  Pt will be able to show 5/5 strength in core and LEs      PT LONG TERM GOAL #6   Title  Pt will be able to take 2 flights of stairs to access home, work multiple floors.    Status  On-going            Plan - 04/12/19 1422    Clinical Impression Statement  Worked on elevation changes with planks and surrender.  HR up to 133 with deep TRX squats    PT Treatment/Interventions  ADLs/Self Care Home Management;Therapeutic activities;Patient/family education;Therapeutic exercise;Other (comment)    PT Next Visit Plan  lifting, squatting, cardio prior, standing, core.  INTERVALS, EMOM with step ups    PT Home Exercise Plan  4 way SLR, calf raise, bridge, supine knee lift (core), bridge with clam, march, bird dog    Consulted and Agree with Plan of Care  Patient       Patient will benefit from skilled therapeutic intervention in order to improve the following deficits and impairments:  Postural dysfunction, Decreased activity tolerance, Cardiopulmonary status limiting activity, Decreased mobility, Dizziness  Visit Diagnosis: POTS (postural orthostatic tachycardia syndrome)  Dizziness and giddiness     Problem List Patient Active Problem List   Diagnosis Date Noted  . Lymphadenopathy 02/10/2011    Hanae Waiters 04/12/2019, 4:18 PM  [facility] [address] [place], [place], [address] Phone: [phone]   Fax:  [phone]  Name: Roberta [name] MRN: [id number] Date of Birth: Nov 09, 1990  [name], PT 04/12/19 4:19 PM Phone: [phone] Fax: [phone]

## 2019-04-12 NOTE — Patient Instructions (Signed)
Access Code: [email or web address]  URL: [email or web address]  Date: 04/12/2019  Prepared by: Karie Mainland   Exercises  Standing to Half Kneel with Chair Support - 10 reps - 1 sets - 1x daily - 3x weekly

## 2019-04-26 ENCOUNTER — Other Ambulatory Visit: Payer: Self-pay

## 2019-04-26 ENCOUNTER — Encounter: Payer: Self-pay | Admitting: Physical Therapy

## 2019-04-26 ENCOUNTER — Ambulatory Visit: Payer: Managed Care, Other (non HMO) | Attending: Internal Medicine | Admitting: Physical Therapy

## 2019-04-26 DIAGNOSIS — R42 Dizziness and giddiness: Secondary | ICD-10-CM | POA: Diagnosis present

## 2019-04-26 DIAGNOSIS — I498 Other specified cardiac arrhythmias: Secondary | ICD-10-CM | POA: Insufficient documentation

## 2019-04-26 DIAGNOSIS — G90A Postural orthostatic tachycardia syndrome (POTS): Secondary | ICD-10-CM

## 2019-04-26 NOTE — Therapy (Addendum)
[facility] [address] , [place], [address] Phone: [phone]   Fax:  [phone]  Physical Therapy Treatment/Renewal  Patient Details  Name: Roberta [name] MRN: [id number] Date of Birth: 11/14/1990 Referring Provider (PT): Dr. Walter Pharr    Encounter Date: 04/26/2019  PT End of Session - 04/26/19 1449    Visit Number  25    Number of Visits  32    Date for PT Re-Evaluation  06/21/19    PT Start Time  1447    PT Stop Time  1530    PT Time Calculation (min)  43 min    Activity Tolerance  Patient tolerated treatment well    Behavior During Therapy  [facility] for tasks assessed/performed       History reviewed. No pertinent past medical history.  History reviewed. No pertinent surgical history.  There were no vitals filed for this visit.  Subjective Assessment - 04/26/19 1447    Subjective  I have to start a new medicine, I am so swollen!    Currently in Pain?  No/denies               PT Education - 04/26/19 1634    Education Details  plank form, core and correction of swayback , avoid leaning on hip ligaments when fatigued    Person(s) Educated  Patient    Methods  Explanation;Demonstration;Tactile cues;Verbal cues    Comprehension  Verbalized understanding;Returned demonstration;Verbal cues required       PT Short Term Goals - 02/22/19 1235      PT SHORT TERM GOAL #1   Title  Pt will be consistent and I with HEP for core, large muscle group strengthening    Status  Achieved      PT SHORT TERM GOAL #2   Title  Pt will use RPE and HR monitor (Fitbit) to monitor self while doing cardio    Status  Achieved      PT SHORT TERM GOAL #3   Title  Pt will be able to report feeling less fatigue post work, able to do simple core work after a few hours of rest.    Status  Achieved        PT Long Term Goals - 04/26/19 1647      PT LONG TERM GOAL #1   Title  Pt will be able to show Independence with HEP  and consistent cardio routine    Baseline  gym in her apt is now closed    Status  On-going    Target Date  06/21/19      PT LONG TERM GOAL #2   Title  Patient will be able to squat, lift up to 15 lbs x 10 with no pain, just min fatigue    Status  Achieved    Target Date  06/21/19      PT LONG TERM GOAL #3   Title  Pt will be able to see 2 patients at work consecutively without need to rest, document.    Status  Achieved    Target Date  06/21/19      PT LONG TERM GOAL #4   Title  Pt will be able to complete 30 min of cardio without undue fatigue    Status  On-going    Target Date  06/21/19      PT LONG TERM GOAL #5   Title  Pt will be able to show 5/5 strength in core and LEs      Baseline  core 4/5, glute med weakness in SLS, hips 4/5    Status  Partially Met    Target Date  06/21/19      Additional Long Term Goals   Additional Long Term Goals  Yes      PT LONG TERM GOAL #6   Title  Pt will be able to take 2 flights of stairs to access home, work multiple floors.    Status  On-going    Target Date  06/21/19      PT LONG TERM GOAL #7   Title  Patient will be able to tolerate change in elevation with exercises (plank to standing) with no more than min difficulty.    Time  8    Period  Weeks    Status  New    Target Date  06/21/19            Plan - 04/26/19 1635    Clinical Impression Statement  HR up to 151 on elliptical.  Worked on lumbopelvic stability and hip strength in standing.  Needs cues throughout. She continues to make progres but is not able to use stairs at work more than 1 time (2 flights).  She would benefit from continued PT for further conditioning and monitoring of vital signs when symptomatic.    Stability/Clinical Decision Making  Evolving/Moderate complexity    PT Treatment/Interventions  ADLs/Self Care Home Management;Therapeutic activities;Patient/family education;Therapeutic exercise;Other (comment)    PT Next Visit Plan  lifting, squatting,  cardio prior, standing, core. INTERVALS, EMOM with step ups    PT Home Exercise Plan  4 way SLR, calf raise, bridge, supine knee lift (core), bridge with clam, march, bird dog    Consulted and Agree with Plan of Care  Patient       Patient will benefit from skilled therapeutic intervention in order to improve the following deficits and impairments:  Postural dysfunction, Decreased activity tolerance, Cardiopulmonary status limiting activity, Decreased mobility, Dizziness  Visit Diagnosis: POTS (postural orthostatic tachycardia syndrome) - Plan: PT plan of care cert/re-cert  Dizziness and giddiness - Plan: PT plan of care cert/re-cert     Problem List Patient Active Problem List   Diagnosis Date Noted  . Lymphadenopathy 02/10/2011    PAA,JENNIFER 04/27/2019, 9:38 AM  [facility] [address] [place], [place], [address] Phone: [phone]   Fax:  [phone]  Name: Roberta [name] MRN: [id number] Date of Birth: 11/05/1990  Jennifer Paa, PT 04/27/19 9:38 AM Phone: [phone] Fax: [phone]  

## 2019-05-03 ENCOUNTER — Ambulatory Visit: Payer: Managed Care, Other (non HMO) | Admitting: Physical Therapy

## 2019-05-03 ENCOUNTER — Encounter: Payer: Self-pay | Admitting: Physical Therapy

## 2019-05-03 ENCOUNTER — Other Ambulatory Visit: Payer: Self-pay

## 2019-05-03 DIAGNOSIS — I498 Other specified cardiac arrhythmias: Secondary | ICD-10-CM | POA: Diagnosis not present

## 2019-05-03 DIAGNOSIS — R42 Dizziness and giddiness: Secondary | ICD-10-CM

## 2019-05-03 DIAGNOSIS — G90A Postural orthostatic tachycardia syndrome (POTS): Secondary | ICD-10-CM

## 2019-05-03 NOTE — Therapy (Signed)
[place], [place], [address] Phone: [phone]   Fax:  [phone]  Physical Therapy Treatment  Patient Details  Name: Roberta [name] MRN: [id number] Date of Birth: 1990-09-04 Referring Provider (PT): Dr. Deland Pretty    Encounter Date: 05/03/2019  PT End of Session - 05/03/19 1337    Visit Number  26    Number of Visits  32    Date for PT Re-Evaluation  06/21/19    PT Start Time  4132    PT Stop Time  1400    PT Time Calculation (min)  45 min    Activity Tolerance  Patient tolerated treatment well    Behavior During Therapy  [facility] for tasks assessed/performed       History reviewed. No pertinent past medical history.  History reviewed. No pertinent surgical history.  There were no vitals filed for this visit.  Subjective Assessment - 05/03/19 1316    Subjective  They reduced my medicine because my BP was too low.  Its normal when I sit but high when I stand.  I take my BP every night home.    Currently in Pain?  No/denies         [facility] Adult PT Treatment/Exercise - 05/03/19 0001      Lumbar Exercises: Aerobic   Recumbent Bike  8 min L2 hills, HR up to 130     Other Aerobic Exercise  Tabata step ups and squat, then overhead press  4:00, 15 sec :15 sec step ups      Lumbar Exercises: Machines for Strengthening   Cybex Lumbar Extension  HR up to 122-125      Lumbar Exercises: Standing   Other Standing Lumbar Exercises  BOSU (kneeling) planks x 10, forward  lunge on BOSU with horizontal abduction,  and then double leg standing  with arm exercises       Lumbar Exercises: Supine   Other Supine Lumbar Exercises  90/90 supine shoulder ext x 10 , done in hookying x 10 red band           PT Education - 05/03/19 1337    Education Details  cont to reinforce core with standing    Person(s) Educated  Patient    Methods  Explanation    Comprehension  Verbalized understanding       PT Short  Term Goals - 02/22/19 1235      PT SHORT TERM GOAL #1   Title  Pt will be consistent and I with HEP for core, large muscle group strengthening    Status  Achieved      PT SHORT TERM GOAL #2   Title  Pt will use RPE and HR monitor (Fitbit) to monitor self while doing cardio    Status  Achieved      PT SHORT TERM GOAL #3   Title  Pt will be able to report feeling less fatigue post work, able to do simple core work after a few hours of rest.    Status  Achieved        PT Long Term Goals - 05/03/19 1503      PT LONG TERM GOAL #1   Title  Pt will be able to show Independence with HEP and consistent cardio routine    Status  On-going      PT LONG TERM GOAL #2   Title  Patient will be able to squat, lift up to 15 lbs x 10  with no pain, just min fatigue    Baseline  improved    Status  Achieved      PT LONG TERM GOAL #3   Title  Pt will be able to see 2 patients at work consecutively without need to rest, document.    Status  Achieved      PT LONG TERM GOAL #4   Title  Pt will be able to complete 30 min of cardio without undue fatigue    Baseline  20-25 min    Status  On-going      PT LONG TERM GOAL #5   Title  Pt will be able to show 5/5 strength in core and LEs    Baseline  core 4/5, glute med weakness in SLS, hips 4/5    Status  Partially Met      PT LONG TERM GOAL #6   Title  Pt will be able to take 2 flights of stairs to access home, work multiple floors.    Status  On-going      PT LONG TERM GOAL #7   Title  Patient will be able to tolerate change in elevation with exercises (plank to standing) with no more than min difficulty.   dizzy today   Baseline  dizzy with each attenpt    Status  On-going            Plan - 05/03/19 1342    Clinical Impression Statement  Patient able ot work on cardio/core conditioinng with Tabata and BOSU.  HR up to 133 with squat and overhead press. She continues to have difficulty with flights of stairs at work.  Planks are a  challenge and lacks coore control, UE stability.    PT Frequency  1x / week    PT Duration  8 weeks    PT Treatment/Interventions  ADLs/Self Care Home Management;Therapeutic activities;Patient/family education;Therapeutic exercise;Other (comment)    PT Next Visit Plan  lifting, squatting, cardio prior, standing, core. INTERVALS, EMOM with step ups    PT Home Exercise Plan  4 way SLR, calf raise, bridge, supine knee lift (core), bridge with clam, march, bird dog. Supine 90/90 extension and dead bug for core    Consulted and Agree with Plan of Care  Patient       Patient will benefit from skilled therapeutic intervention in order to improve the following deficits and impairments:  Postural dysfunction, Decreased activity tolerance, Cardiopulmonary status limiting activity, Decreased mobility, Dizziness  Visit Diagnosis: POTS (postural orthostatic tachycardia syndrome)  Dizziness and giddiness     Problem List Patient Active Problem List   Diagnosis Date Noted  . Lymphadenopathy 02/10/2011    Artrice Kraker 05/03/2019, 3:12 PM  [facility] [address] [place], [place], [address] Phone: [phone]   Fax:  [phone]  Name: Roberta [name] MRN: [id number] Date of Birth: 1990/05/14  [name], PT 05/03/19 3:12 PM Phone: [phone] Fax: [phone]

## 2019-05-03 NOTE — Patient Instructions (Signed)
Access Code: [email or web address]  URL: [email or web address]  Date: 05/03/2019  Prepared by: Karie Mainland   Exercises  Supine Dead Bug with Leg Extension - 10 reps - 2 sets - 5 hold - 1x daily - 7x weekly  Supine 90/90 Shoulder Extension with Resistance - 10 reps - 2 sets - 5 hold - 1x daily - 7x weekly

## 2019-05-10 ENCOUNTER — Ambulatory Visit: Payer: Managed Care, Other (non HMO) | Admitting: Physical Therapy

## 2019-05-17 ENCOUNTER — Ambulatory Visit: Payer: Managed Care, Other (non HMO) | Admitting: Physical Therapy

## 2019-05-17 ENCOUNTER — Other Ambulatory Visit: Payer: Self-pay

## 2019-05-17 ENCOUNTER — Encounter: Payer: Self-pay | Admitting: Physical Therapy

## 2019-05-17 VITALS — BP 150/95 | HR 97

## 2019-05-17 DIAGNOSIS — R42 Dizziness and giddiness: Secondary | ICD-10-CM

## 2019-05-17 DIAGNOSIS — I498 Other specified cardiac arrhythmias: Secondary | ICD-10-CM | POA: Diagnosis not present

## 2019-05-17 DIAGNOSIS — G90A Postural orthostatic tachycardia syndrome (POTS): Secondary | ICD-10-CM

## 2019-05-17 NOTE — Therapy (Signed)
[place], [place], [address] Phone: [phone]   Fax:  [phone]  Physical Therapy Treatment  Patient Details  Name: Roberta [name] MRN: [id number] Date of Birth: 1990/08/25 Referring Provider (PT): Dr. Deland Pretty  Dr. Gabriel Carina (Cardiologist)   Encounter Date: 05/17/2019  PT End of Session - 05/17/19 1231    Visit Number  27    Number of Visits  32    Date for PT Re-Evaluation  06/21/19    PT Start Time  1221    PT Stop Time  1310    PT Time Calculation (min)  49 min    Activity Tolerance  Patient tolerated treatment well;Treatment limited secondary to medical complications (Comment)    Behavior During Therapy  [facility] for tasks assessed/performed       History reviewed. No pertinent past medical history.  History reviewed. No pertinent surgical history.  Vitals:   05/17/19 1249 05/17/19 1300  BP: (!) 135/97 (!) 150/95  Pulse: 97    Left clinic at 142/93.  Mild headache, no dizziness or chest pain.   Subjective Assessment - 05/17/19 1226    Subjective  I go to the [facility] POTS clinic in about a month.  Cardiac EP.  They are weaning me off one of my medicine (Cardizem) because it makes me swell.    Currently in Pain?  No/denies         [facility] Adult PT Treatment/Exercise - 05/17/19 0001      Lumbar Exercises: Stretches   Active Hamstring Stretch  3 reps;30 seconds    ITB Stretch  1 rep;30 seconds    ITB Stretch Limitations  and inner thigh       Lumbar Exercises: Aerobic   Elliptical  6 min L 10 ramp and L1 resist.  HR up to 140 bpm       Lumbar Exercises: Standing   Forward Lunge Limitations  front lunge on BOSU Palloff press 2 plates then add rotation away x 10 each side     Other Standing Lumbar Exercises  BOSU: curve up standing with UE exercise forward and lateral raise, too challengin    Other Standing Lumbar Exercises  BOSU flat up: eyes open, head turns and mini squats x 20       Knee/Hip Exercises: Sidelying   Hip ABduction  Strengthening;Both;2 sets;10 reps    Clams  2 x 10                PT Short Term Goals - 02/22/19 1235      PT SHORT TERM GOAL #1   Title  Pt will be consistent and I with HEP for core, large muscle group strengthening    Status  Achieved      PT SHORT TERM GOAL #2   Title  Pt will use RPE and HR monitor (Fitbit) to monitor self while doing cardio    Status  Achieved      PT SHORT TERM GOAL #3   Title  Pt will be able to report feeling less fatigue post work, able to do simple core work after a few hours of rest.    Status  Achieved        PT Long Term Goals - 05/03/19 1503      PT LONG TERM GOAL #1   Title  Pt will be able to show Independence with HEP and consistent cardio routine    Status  On-going  PT LONG TERM GOAL #2   Title  Patient will be able to squat, lift up to 15 lbs x 10 with no pain, just min fatigue    Baseline  improved    Status  Achieved      PT LONG TERM GOAL #3   Title  Pt will be able to see 2 patients at work consecutively without need to rest, document.    Status  Achieved      PT LONG TERM GOAL #4   Title  Pt will be able to complete 30 min of cardio without undue fatigue    Baseline  20-25 min    Status  On-going      PT LONG TERM GOAL #5   Title  Pt will be able to show 5/5 strength in core and LEs    Baseline  core 4/5, glute med weakness in SLS, hips 4/5    Status  Partially Met      PT LONG TERM GOAL #6   Title  Pt will be able to take 2 flights of stairs to access home, work multiple floors.    Status  On-going      PT LONG TERM GOAL #7   Title  Patient will be able to tolerate change in elevation with exercises (plank to standing) with no more than min difficulty.   dizzy today   Baseline  dizzy with each attenpt    Status  On-going            Plan - 05/17/19 1246    Clinical Impression Statement  Pt lacks proprioception with standing balance/core exercises.  Dizzy with standing 20 min. Check of BP after cardio and diastolc was elevated.  Lying supine BP cont to increase to 150/98.  Stayed on the mat and monitored intermittently. Min headache encouraged light activity and increase hydration.    PT Treatment/Interventions  ADLs/Self Care Home Management;Therapeutic activities;Patient/family education;Therapeutic exercise;Other (comment)    PT Next Visit Plan  lifting, squatting, cardio prior, standing, core. INTERVALS, EMOM with step ups. Monitor BP    PT Home Exercise Plan  4 way SLR, calf raise, bridge, supine knee lift (core), bridge with clam, march, bird dog. Supine 90/90 extension and dead bug for core    Consulted and Agree with Plan of Care  Patient       Patient will benefit from skilled therapeutic intervention in order to improve the following deficits and impairments:  Postural dysfunction, Decreased activity tolerance, Cardiopulmonary status limiting activity, Decreased mobility, Dizziness  Visit Diagnosis: POTS (postural orthostatic tachycardia syndrome)  Dizziness and giddiness     Problem List Patient Active Problem List   Diagnosis Date Noted  . Lymphadenopathy 02/10/2011    Roberta [name] 05/17/2019, 1:13 PM  [facility] [address] [place], [place], [address] Phone: [phone]   Fax:  [phone]  Name: Roberta [name] MRN: [id number] Date of Birth: 04-20-1990  [name], PT 05/17/19 1:13 PM Phone: [phone] Fax: [phone]

## 2019-05-24 ENCOUNTER — Ambulatory Visit: Payer: Managed Care, Other (non HMO) | Attending: Internal Medicine | Admitting: Physical Therapy

## 2019-05-24 ENCOUNTER — Other Ambulatory Visit: Payer: Self-pay

## 2019-05-24 VITALS — BP 143/83

## 2019-05-24 DIAGNOSIS — R42 Dizziness and giddiness: Secondary | ICD-10-CM | POA: Diagnosis present

## 2019-05-24 DIAGNOSIS — I498 Other specified cardiac arrhythmias: Secondary | ICD-10-CM | POA: Diagnosis present

## 2019-05-24 DIAGNOSIS — G90A Postural orthostatic tachycardia syndrome (POTS): Secondary | ICD-10-CM

## 2019-05-24 NOTE — Therapy (Signed)
[address] [place], [place], [address] Phone: [phone]   Fax:  [phone]  Physical Therapy Treatment  Patient Details  Name: Roberta [name] MRN: [id number] Date of Birth: 04-Feb-1991 Referring Provider (PT): Dr. Deland Pretty    Encounter Date: 05/24/2019  PT End of Session - 05/24/19 1419    Visit Number  28    Number of Visits  32    Date for PT Re-Evaluation  06/21/19    PT Start Time  1318    PT Stop Time  1400    PT Time Calculation (min)  42 min    Activity Tolerance  Patient tolerated treatment well    Behavior During Therapy  [facility] for tasks assessed/performed       No past medical history on file.  No past surgical history on file.  Vitals:   05/24/19 1321  BP: (!) 143/83    Subjective Assessment - 05/24/19 1321    Subjective  They doubled my medicine (Natalol) , 127/89 after resting.  No pain.  Has to go back to cardio next week.       [facility] Adult PT Treatment/Exercise - 05/24/19 0001      Lumbar Exercises: Machines for Strengthening   Other Lumbar Machine Exercise  row 35lb  2 x 15     Other Lumbar Machine Exercise  lat pull down 20 lbs 2 x 15 reps.  Ears ringing.       Knee/Hip Exercises: Aerobic   Stationary Bike  6 min L2 hills    150/85     Knee/Hip Exercises: Machines for Strengthening   Cybex Knee Extension  25 lbs x 1 x 15 reps , 20 lbs 1 x 15     Cybex Knee Flexion  35 lbs 2 x 15     Other Machine  chest press 15 lbs 2 x 15       Shoulder Exercises: ROM/Strengthening   UBE (Upper Arm Bike)  6 min L2 , 3 min forward and 3 min back    HR 122, SaO2 94% RPE 13            PT Education - 05/24/19 1418    Education Details  form with lifting    Person(s) Educated  Patient    Methods  Explanation    Comprehension  Verbalized understanding       PT Short Term Goals - 02/22/19 1235      PT SHORT TERM GOAL #1   Title  Pt will be consistent and I with HEP for core, large  muscle group strengthening    Status  Achieved      PT SHORT TERM GOAL #2   Title  Pt will use RPE and HR monitor (Fitbit) to monitor self while doing cardio    Status  Achieved      PT SHORT TERM GOAL #3   Title  Pt will be able to report feeling less fatigue post work, able to do simple core work after a few hours of rest.    Status  Achieved        PT Long Term Goals - 05/03/19 1503      PT LONG TERM GOAL #1   Title  Pt will be able to show Independence with HEP and consistent cardio routine    Status  On-going      PT LONG TERM GOAL #2   Title  Patient will be able to squat, lift  up to 15 lbs x 10 with no pain, just min fatigue    Baseline  improved    Status  Achieved      PT LONG TERM GOAL #3   Title  Pt will be able to see 2 patients at work consecutively without need to rest, document.    Status  Achieved      PT LONG TERM GOAL #4   Title  Pt will be able to complete 30 min of cardio without undue fatigue    Baseline  20-25 min    Status  On-going      PT LONG TERM GOAL #5   Title  Pt will be able to show 5/5 strength in core and LEs    Baseline  core 4/5, glute med weakness in SLS, hips 4/5    Status  Partially Met      PT LONG TERM GOAL #6   Title  Pt will be able to take 2 flights of stairs to access home, work multiple floors.    Status  On-going      PT LONG TERM GOAL #7   Title  Patient will be able to tolerate change in elevation with exercises (plank to standing) with no more than min difficulty.   dizzy today   Baseline  dizzy with each attenpt    Status  On-going            Plan - 05/24/19 1421    Clinical Impression Statement  Pt with slightly elevated BP during session, RPE was 13-14 and HR up to 130 bpm.  Reported ringing in her ears end of session, BP 140/85. Performed 2 x 15 reps of all weighted machines    PT Treatment/Interventions  ADLs/Self Care Home Management;Therapeutic activities;Patient/family education;Therapeutic  exercise;Other (comment)    PT Next Visit Plan  lifting, squatting, cardio prior, standing, core. INTERVALS, EMOM with step ups. Monitor BP    PT Home Exercise Plan  4 way SLR, calf raise, bridge, supine knee lift (core), bridge with clam, march, bird dog. Supine 90/90 extension and dead bug for core    Consulted and Agree with Plan of Care  Patient       Patient will benefit from skilled therapeutic intervention in order to improve the following deficits and impairments:  Postural dysfunction, Decreased activity tolerance, Cardiopulmonary status limiting activity, Decreased mobility, Dizziness  Visit Diagnosis: POTS (postural orthostatic tachycardia syndrome)  Dizziness and giddiness     Problem List Patient Active Problem List   Diagnosis Date Noted  . Lymphadenopathy 02/10/2011    Demauri Advincula 05/24/2019, 4:30 PM  [place], [place], [address] Phone: [phone]   Fax:  [phone]  Name: KINZI FREDIANI MRN: [id number] Date of Birth: 1991/03/15  [name], PT 05/24/19 4:30 PM Phone: [phone] Fax: [phone]

## 2019-05-31 ENCOUNTER — Encounter: Payer: Self-pay | Admitting: Physical Therapy

## 2019-05-31 ENCOUNTER — Ambulatory Visit: Payer: Managed Care, Other (non HMO) | Admitting: Physical Therapy

## 2019-05-31 ENCOUNTER — Other Ambulatory Visit: Payer: Self-pay

## 2019-05-31 VITALS — BP 144/90 | HR 122

## 2019-05-31 DIAGNOSIS — I498 Other specified cardiac arrhythmias: Secondary | ICD-10-CM | POA: Diagnosis not present

## 2019-05-31 DIAGNOSIS — G90A Postural orthostatic tachycardia syndrome (POTS): Secondary | ICD-10-CM

## 2019-05-31 DIAGNOSIS — R42 Dizziness and giddiness: Secondary | ICD-10-CM

## 2019-05-31 NOTE — Therapy (Signed)
[place], [place], [address] Phone: [phone]   Fax:  [phone]  Physical Therapy Treatment  Patient Details  Name: Roberta [name] MRN: [id number] Date of Birth: 09/15/1990 Referring Provider (PT): Dr. Deland Pretty    Encounter Date: 05/31/2019  PT End of Session - 05/31/19 1345    Visit Number  29    Number of Visits  32    Date for PT Re-Evaluation  06/21/19    PT Start Time  1308    PT Stop Time  1400    PT Time Calculation (min)  52 min    Activity Tolerance  Patient tolerated treatment well    Behavior During Therapy  [facility] for tasks assessed/performed       History reviewed. No pertinent past medical history.  History reviewed. No pertinent surgical history.  Vitals:   05/31/19 1313 05/31/19 1325 05/31/19 1344 05/31/19 1356  BP: 128/84 140/85 120/81 (!) 144/90  Pulse:  (!) 122      Subjective Assessment - 05/31/19 1315    Subjective  New med.  Will be having a stress test.    Currently in Pain?  No/denies       [facility] Adult PT Treatment/Exercise - 05/31/19 0001      Ambulation/Gait   Gait Comments  Stair climbing, 30 sec intervals with 15 sec off, done x 4 times , cues for increased speed      High Level Balance   High Level Balance Activities  Backward walking;Head turns    High Level Balance Comments  tandem walking, SLS with head turns.       Lumbar Exercises: Standing   Functional Squats  20 reps    Functional Squats Limitations  used 10 lbs dumbbell     Forward Lunge Limitations  static lunge with freemotion rotation 1 plate x 10     Other Standing Lumbar Exercises  SLS static and added arms over head 6 lbs wgt x 10       Knee/Hip Exercises: Aerobic   Tread Mill  10 min , gradually increased to 4% , 2.5 mph       Shoulder Exercises: Standing   Shoulder Flexion Weight (lbs)  1 plates  bent elbow, then fly x 10     Row  Strengthening    Row Weight (lbs)  3 plates     Diagonals  Strengthening;Both;10 reps    Diagonals Weight (lbs)  1 plate              PT Education - 05/31/19 1833    Education Details  standing core, stabilization    Person(s) Educated  Patient    Methods  Explanation;Demonstration    Comprehension  Verbalized understanding;Returned demonstration;Verbal cues required       PT Short Term Goals - 02/22/19 1235      PT SHORT TERM GOAL #1   Title  Pt will be consistent and I with HEP for core, large muscle group strengthening    Status  Achieved      PT SHORT TERM GOAL #2   Title  Pt will use RPE and HR monitor (Fitbit) to monitor self while doing cardio    Status  Achieved      PT SHORT TERM GOAL #3   Title  Pt will be able to report feeling less fatigue post work, able to do simple core work after a few hours of rest.    Status  Achieved  PT Long Term Goals - 05/03/19 1503      PT LONG TERM GOAL #1   Title  Pt will be able to show Independence with HEP and consistent cardio routine    Status  On-going      PT LONG TERM GOAL #2   Title  Patient will be able to squat, lift up to 15 lbs x 10 with no pain, just min fatigue    Baseline  improved    Status  Achieved      PT LONG TERM GOAL #3   Title  Pt will be able to see 2 patients at work consecutively without need to rest, document.    Status  Achieved      PT LONG TERM GOAL #4   Title  Pt will be able to complete 30 min of cardio without undue fatigue    Baseline  20-25 min    Status  On-going      PT LONG TERM GOAL #5   Title  Pt will be able to show 5/5 strength in core and LEs    Baseline  core 4/5, glute med weakness in SLS, hips 4/5    Status  Partially Met      PT LONG TERM GOAL #6   Title  Pt will be able to take 2 flights of stairs to access home, work multiple floors.    Status  On-going      PT LONG TERM GOAL #7   Title  Patient will be able to tolerate change in elevation with exercises (plank to standing) with no more than min  difficulty.   dizzy today   Baseline  dizzy with each attenpt    Status  On-going            Plan - 05/31/19 1834    Clinical Impression Statement  Patient with HR less than 125 during sessoin, BP controlled with multiple check ins.  Continues to need cues to avoid lumbar hyperextension and "hanging on ligaments".    PT Treatment/Interventions  ADLs/Self Care Home Management;Therapeutic activities;Patient/family education;Therapeutic exercise;Other (comment)    PT Next Visit Plan  lifting, squatting, cardio prior, standing, core. INTERVALS, EMOM with step ups. Monitor BP    PT Home Exercise Plan  4 way SLR, calf raise, bridge, supine knee lift (core), bridge with clam, march, bird dog. Supine 90/90 extension and dead bug for core    Consulted and Agree with Plan of Care  Patient       Patient will benefit from skilled therapeutic intervention in order to improve the following deficits and impairments:  Postural dysfunction, Decreased activity tolerance, Cardiopulmonary status limiting activity, Decreased mobility, Dizziness  Visit Diagnosis: POTS (postural orthostatic tachycardia syndrome)  Dizziness and giddiness     Problem List Patient Active Problem List   Diagnosis Date Noted  . Lymphadenopathy 02/10/2011    Roberta [name] 05/31/2019, 6:39 PM  [place], [place], [address] Phone: [phone]   Fax:  [phone]  Name: Roberta [name] MRN: [id number] Date of Birth: [birth year]  [name], PT 05/31/19 6:39 PM Phone: [phone] Fax: [phone]

## 2019-06-07 ENCOUNTER — Encounter: Payer: Self-pay | Admitting: Physical Therapy

## 2019-06-07 ENCOUNTER — Other Ambulatory Visit: Payer: Self-pay

## 2019-06-07 ENCOUNTER — Ambulatory Visit: Payer: Managed Care, Other (non HMO) | Admitting: Physical Therapy

## 2019-06-07 VITALS — BP 130/90 | HR 109

## 2019-06-07 DIAGNOSIS — I498 Other specified cardiac arrhythmias: Secondary | ICD-10-CM

## 2019-06-07 DIAGNOSIS — R42 Dizziness and giddiness: Secondary | ICD-10-CM

## 2019-06-07 DIAGNOSIS — G90A Postural orthostatic tachycardia syndrome (POTS): Secondary | ICD-10-CM

## 2019-06-07 NOTE — Therapy (Signed)
[place], [place], [address] Phone: [phone]   Fax:  [phone]  Physical Therapy Treatment  Patient Details  Name: [place]ta [name] MRN: [id number] Date of Birth: Apr 17, 1990 Referring Provider (PT): Dr. Deland Pretty    Encounter Date: 06/07/2019  PT End of Session - 06/07/19 1239    Visit Number  30    Number of Visits  32    Date for PT Re-Evaluation  06/21/19    PT Start Time  1225    PT Stop Time  1316    PT Time Calculation (min)  51 min    Activity Tolerance  Patient tolerated treatment well    Behavior During Therapy  [facility] for tasks assessed/performed       History reviewed. No pertinent past medical history.  History reviewed. No pertinent surgical history.  Vitals:   06/07/19 1242 06/07/19 1313 06/07/19 1314  BP: 134/84 (!) 135/99 130/90  Pulse:  (!) 109     Subjective Assessment - 06/07/19 1230    Subjective  They are trying to wean me off my beta blocker.  It make me short of breath.    Currently in Pain?  No/denies        [facility] Adult PT Treatment/Exercise - 06/07/19 0001      Lumbar Exercises: Standing   Other Standing Lumbar Exercises  hip hinge in tall kneeling x 10, added 5 lbs shoulder press out x 10       Lumbar Exercises: Seated   Other Seated Lumbar Exercises  seated on ball: shoulder flexion x 10 , horixontal abd green x 10 and alternating UE/LE march on ball x 10       Knee/Hip Exercises: Aerobic   Elliptical  5 min level 11 ramp, level 1 resist.  HR 166 post       Knee/Hip Exercises: Standing   Functional Squat  3 sets;10 reps    Functional Squat Limitations  10 lbs     Lunge Walking - Round Trips  lunge, tap knee on foam x 10 each side      forward lunge hold, fly and row combo x 10 each leg, 3 lbs wgt. Cues for posture and alignment throughout.  Increased rest breaks due to dyspnea, recovery and BP, O2 sat checks.         PT Education - 06/07/19 1324    Education Details  HEP upgrade, ball exercise    Person(s) Educated  Patient    Methods  Explanation;Demonstration;Handout    Comprehension  Verbalized understanding;Returned demonstration       PT Short Term Goals - 02/22/19 1235      PT SHORT TERM GOAL #1   Title  Pt will be consistent and I with HEP for core, large muscle group strengthening    Status  Achieved      PT SHORT TERM GOAL #2   Title  Pt will use RPE and HR monitor (Fitbit) to monitor self while doing cardio    Status  Achieved      PT SHORT TERM GOAL #3   Title  Pt will be able to report feeling less fatigue post work, able to do simple core work after a few hours of rest.    Status  Achieved        PT Long Term Goals - 06/07/19 1326      PT LONG TERM GOAL #1   Title  Pt will be able to show  Independence with HEP and consistent cardio routine    Status  On-going      PT LONG TERM GOAL #2   Title  Patient will be able to squat, lift up to 15 lbs x 10 with no pain, just min fatigue    Status  Achieved      PT LONG TERM GOAL #3   Title  Pt will be able to see 2 patients at work consecutively without need to rest, document.    Status  Achieved      PT LONG TERM GOAL #4   Title  Pt will be able to complete 30 min of cardio without undue fatigue    Status  On-going      PT LONG TERM GOAL #5   Title  Pt will be able to show 5/5 strength in core and LEs    Status  Partially Met      PT LONG TERM GOAL #6   Title  Pt will be able to take 2 flights of stairs to access home, work multiple floors.    Status  On-going      PT LONG TERM GOAL #7   Title  Patient will be able to tolerate change in elevation with exercises (plank to standing) with no more than min difficulty.    Status  On-going            Plan - 06/07/19 1326    Clinical Impression Statement  HR were higher today due to weaning off of Atenolol. Worked in tall kneeling for change in elevation and core challenge. Added change of HEP with a  ball as she has one but has not used it.  DBP in 90's but without symptoms.  Cont to monitor symptoms in supervised PT environment.    PT Treatment/Interventions  ADLs/Self Care Home Management;Therapeutic activities;Patient/family education;Therapeutic exercise;Other (comment)    PT Next Visit Plan  lifting, squatting, cardio prior, standing, core. INTERVALS, EMOM with step ups. Monitor BP    PT Home Exercise Plan  4 way SLR, calf raise, bridge, supine knee lift (core), bridge with clam, march, bird dog. Supine 90/90 extension and dead bug for core. Tall kneeling (hinge, shoulder flex/ext) and seated ball/core    Consulted and Agree with Plan of Care  Patient       Patient will benefit from skilled therapeutic intervention in order to improve the following deficits and impairments:  Postural dysfunction, Decreased activity tolerance, Cardiopulmonary status limiting activity, Decreased mobility, Dizziness  Visit Diagnosis: POTS (postural orthostatic tachycardia syndrome)  Dizziness and giddiness     Problem List Patient Active Problem List   Diagnosis Date Noted  . Lymphadenopathy 02/10/2011    Roberta [name] 06/07/2019, 1:38 PM  [facility] [address] [place], [place], [address] Phone: [phone]   Fax:  [phone]  Name: [place] [name] MRN: [id number] Date of Birth: Mar 01, 1991  [name], PT 06/07/19 1:38 PM Phone: [phone] Fax: [phone]

## 2019-06-07 NOTE — Patient Instructions (Signed)
Access Code: [email or web address]: [email or web address]: 03/18/2021Prepared by: Victorino Dike PaaExercises  Tall Kneel Vertical Bridge - 1 x daily - 2 x weekly - 2 sets - 10 reps - 5 hold  Tall Kneeling Shoulder Extensions with Anchored Resistance - 1 x daily - 2 x weekly - 2 sets - 10 reps - 5 hold  Tall Kneeling Chest Press with Resistance - 1 x daily - 2 x weekly - 2 sets - 10 reps - 5 hold  Shoulder Press on Swiss Ball - 1 x daily - 2 x weekly - 2 sets - 10 reps - 5 hold  Swiss Ball March - 1 x daily - 2 x weekly - 2 sets - 10 reps - 5 hold  Seated March with Opposite Arm Flexion on Swiss Ball - 1 x daily - 2 x weekly - 2 sets - 10 reps - 5 hold  Shoulder Horizontal Abduction Seated on Swiss Ball - 1 x daily - 2 x weekly - 2 sets - 10 reps - 5 hold

## 2019-06-14 ENCOUNTER — Other Ambulatory Visit: Payer: Self-pay

## 2019-06-14 ENCOUNTER — Ambulatory Visit: Payer: Managed Care, Other (non HMO) | Admitting: Physical Therapy

## 2019-06-14 ENCOUNTER — Encounter: Payer: Self-pay | Admitting: Physical Therapy

## 2019-06-14 VITALS — BP 120/78

## 2019-06-14 DIAGNOSIS — I498 Other specified cardiac arrhythmias: Secondary | ICD-10-CM

## 2019-06-14 DIAGNOSIS — G90A Postural orthostatic tachycardia syndrome (POTS): Secondary | ICD-10-CM

## 2019-06-14 DIAGNOSIS — R42 Dizziness and giddiness: Secondary | ICD-10-CM

## 2019-06-14 NOTE — Therapy (Signed)
[name], [place], [address] Phone: [phone]   Fax:  [phone]  Physical Therapy Treatment  Patient Details  Name: [place]ta [name] MRN: [id number] Date of Birth: 11/06/1990 Referring Provider (PT): Dr. Deland [name]    Encounter Date: 06/14/2019  PT End of Session - 06/14/19 1446    Visit Number  31    Number of Visits  32    Date for PT Re-Evaluation  06/21/19    PT Start Time  1225    PT Stop Time  1312    PT Time Calculation (min)  47 min    Activity Tolerance  Patient tolerated treatment well    Behavior During Therapy  [name] for tasks assessed/performed       History reviewed. No pertinent past medical history.  History reviewed. No pertinent surgical history.  Vitals:   06/14/19 1256  BP: 120/78    Subjective Assessment - 06/14/19 1230    Subjective  Getting used to the lower dose.  Was in class this weekend ([employer]), slept 2 hours after and was sore.    Currently in Pain?  No/denies             [name] Adult PT Treatment/Exercise - 06/14/19 0001      Ambulation/Gait   Gait Comments  .65 miles, 13 min , RPE 15, HR up to 155 max top of incline       Knee/Hip Exercises: Stretches   Active Hamstring Stretch  2 reps;30 seconds    Quad Stretch  2 reps;30 seconds    Piriformis Stretch  2 reps;30 seconds      Shoulder Exercises: ROM/Strengthening   Lat Pull Limitations  20lbs 2x 15     Cybex Press  3 plate    Cybex Press Limitations  2 x 15     Cybex Row  15 reps    Cybex Row Limitations  4 plates             PT Education - 06/14/19 1446    Education Details  structured walking, fitness program and balancing work duties    Person(s) Educated  Patient    Methods  Explanation    Comprehension  Verbalized understanding       PT Short Term Goals - 02/22/19 1235      PT SHORT TERM GOAL #1   Title  Pt will be consistent and I with HEP for core, large muscle group  strengthening    Status  Achieved      PT SHORT TERM GOAL #2   Title  Pt will use RPE and HR monitor (Fitbit) to monitor self while doing cardio    Status  Achieved      PT SHORT TERM GOAL #3   Title  Pt will be able to report feeling less fatigue post work, able to do simple core work after a few hours of rest.    Status  Achieved        PT Long Term Goals - 06/07/19 1326      PT LONG TERM GOAL #1   Title  Pt will be able to show Independence with HEP and consistent cardio routine    Status  On-going      PT LONG TERM GOAL #2   Title  Patient will be able to squat, lift up to 15 lbs x 10 with no pain, just min fatigue    Status  Achieved  PT LONG TERM GOAL #3   Title  Pt will be able to see 2 patients at work consecutively without need to rest, document.    Status  Achieved      PT LONG TERM GOAL #4   Title  Pt will be able to complete 30 min of cardio without undue fatigue    Status  On-going      PT LONG TERM GOAL #5   Title  Pt will be able to show 5/5 strength in core and LEs    Status  Partially Met      PT LONG TERM GOAL #6   Title  Pt will be able to take 2 flights of stairs to access home, work multiple floors.    Status  On-going      PT LONG TERM GOAL #7   Title  Patient will be able to tolerate change in elevation with exercises (plank to standing) with no more than min difficulty.    Status  On-going            Plan - 06/14/19 1447    Clinical Impression Statement  Walked up hill on nearby street, about 13 min (.65 mile) with HR in 150's, increased time to recover back into 90's (resting tachycardia). Discussed managing her symptoms at work and allowing for exercise routine as well.  Sees new Cardiologist next week in [name].    PT Treatment/Interventions  ADLs/Self Care Home Management;Therapeutic activities;Patient/family education;Therapeutic exercise;Other (comment)    PT Next Visit Plan  lifting, squatting, cardio prior, standing, core.  INTERVALS, EMOM with step ups. Monitor BP    PT Home Exercise Plan  4 way SLR, calf raise, bridge, supine knee lift (core), bridge with clam, march, bird dog. Supine 90/90 extension and dead bug for core. Tall kneeling (hinge, shoulder flex/ext) and seated ball/core    Consulted and Agree with Plan of Care  Patient       Patient will benefit from skilled therapeutic intervention in order to improve the following deficits and impairments:  Postural dysfunction, Decreased activity tolerance, Cardiopulmonary status limiting activity, Decreased mobility, Dizziness  Visit Diagnosis: POTS (postural orthostatic tachycardia syndrome)  Dizziness and giddiness     Problem List Patient Active Problem List   Diagnosis Date Noted  . Lymphadenopathy 02/10/2011    Roberta [name] 06/14/2019, 2:50 PM  [facility] [address] [place], [place], [address] Phone: [phone]   Fax:  [phone]  Name: Roberta [name] MRN: [id number] Date of Birth: 1990/12/10  Roberta [name], PT 06/14/19 2:50 PM Phone: [phone] Fax: [phone]

## 2019-06-28 ENCOUNTER — Other Ambulatory Visit: Payer: Self-pay

## 2019-06-28 ENCOUNTER — Ambulatory Visit: Payer: Managed Care, Other (non HMO) | Attending: Internal Medicine | Admitting: Physical Therapy

## 2019-06-28 VITALS — BP 150/84 | HR 120

## 2019-06-28 DIAGNOSIS — M357 Hypermobility syndrome: Secondary | ICD-10-CM | POA: Diagnosis present

## 2019-06-28 DIAGNOSIS — R42 Dizziness and giddiness: Secondary | ICD-10-CM | POA: Diagnosis present

## 2019-06-28 DIAGNOSIS — G90A Postural orthostatic tachycardia syndrome (POTS): Secondary | ICD-10-CM

## 2019-06-28 DIAGNOSIS — I498 Other specified cardiac arrhythmias: Secondary | ICD-10-CM | POA: Insufficient documentation

## 2019-06-28 NOTE — Therapy (Signed)
[facility] [address] [place], [place], [address] Phone: [phone]   Fax:  [phone]  Physical Therapy Treatment  Patient Details  Name: [place]ta [name] MRN: [id number] Date of Birth: 21-May-1990 Referring Provider (PT): Dr. Merri Brunette [facility]), Dr. Lanae Boast, [place], [place])    Encounter Date: 06/28/2019  PT End of Session - 06/28/19 1325    Visit Number  32    Number of Visits  40    Date for PT Re-Evaluation  08/16/19    PT Start Time  1313    PT Stop Time  1400    PT Time Calculation (min)  47 min    Activity Tolerance  Patient tolerated treatment well;Treatment limited secondary to medical complications (Comment)    Behavior During Therapy  [facility] for tasks assessed/performed       No past medical history on file.  No past surgical history on file.  Vitals:   06/28/19 1334 06/28/19 1359 06/28/19 1402  BP: 140/90  (!) 150/84  Pulse:  (!) 120 (!) 120    Subjective Assessment - 06/28/19 1320    Subjective  Ivabradine is the new med is getting authorized for payment.  Cardologist was very thorough. I feel so crappy aftr work.  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  No/denies        [facility] Adult PT Treatment/Exercise - 06/28/19 0001      Lumbar Exercises: Aerobic   UBE (Upper Arm Bike)  6 min L1 mod pace , 3 min in each direction , cues foe posture       Lumbar Exercises: Seated   Other Seated Lumbar Exercises  10 x STS, chest press x 10, row x 10 , core rotation x 5 each side       Lumbar Exercises: Supine   AB Set Limitations  90/90 table top hold, 10 sec with challenge, done x 5       Knee/Hip Exercises: Aerobic   Stationary Bike  8 min L2 to L6 intervals , HR up to 120       Knee/Hip Exercises: Standing   Other Standing Knee Exercises  sit to stand 15 lbs x 20 BP 140/100, rest into supine BP 131/83.        Knee/Hip Exercises: Supine   Bridges   Strengthening;1 set;15 reps    Straight Leg Raises  Strengthening;Both;1 set;20 reps    Straight Leg Raises Limitations  3 lbs     Straight Leg Raise with External Rotation  Strengthening;Both;1 set;20 reps    Straight Leg Raise with External Rotation Limitations  3 lbs prop on elbows       Shoulder Exercises: ROM/Strengthening   Pec Fly Limitations  on mat 5 lbs dumbbells x 15 , chest press 5 lbs x 15 cues for neck position              PT Education - 06/28/19 1513    Education Details  goals    Person(s) Educated  Patient    Methods  Explanation    Comprehension  Verbalized understanding       PT Short Term Goals - 02/22/19 1235      PT SHORT TERM GOAL #1   Title  Pt will be consistent and I with HEP for core, large muscle group strengthening    Status  Achieved      PT SHORT TERM GOAL #  2   Title  Pt will use RPE and HR monitor (Fitbit) to monitor self while doing cardio    Status  Achieved      PT SHORT TERM GOAL #3   Title  Pt will be able to report feeling less fatigue post work, able to do simple core work after a few hours of rest.    Status  Achieved        PT Long Term Goals - 06/28/19 1342      PT LONG TERM GOAL #1   Title  Pt will be able to show Independence with HEP and consistent cardio routine    Baseline  1-2 times per week , weekends    Status  On-going    Target Date  08/16/19      PT LONG TERM GOAL #2   Title  Patient will be able to squat, lift up to 15 lbs x 10 with no pain, just min fatigue    Status  Achieved    Target Date  08/16/19      PT LONG TERM GOAL #3   Title  Pt will be able to see 2 patients at work consecutively without need to rest, document.    Status  Achieved    Target Date  08/16/19      PT LONG TERM GOAL #4   Title  Pt will be able to complete 30 min of cardio without undue fatigue    Baseline  20-25 min , walking mostly and response varies    Status  On-going    Target Date  08/16/19      PT LONG TERM GOAL #5    Title  Pt will be able to show 5/5 strength in core and LEs    Baseline  R hip abd 4/5, bilat. glute med 3+/5, bilateral glute max 4/5 , bilat. adductor 3-/5. L. hip abd 4+/5    Status  On-going    Target Date  08/16/19      Additional Long Term Goals   Additional Long Term Goals  Yes      PT LONG TERM GOAL #6   Title  Pt will be able to take 2 flights of stairs to access home, work multiple floors without excessive fatigue    Baseline  takes slowly, rests, HR in 120's    Status  On-going    Target Date  08/16/19      PT LONG TERM GOAL #7   Title  Patient will be able to tolerate change in elevation with exercises (plank to standing) with no more than min difficulty.    Baseline  dizzy with each attenpt    Status  On-going    Target Date  08/16/19      PT LONG TERM GOAL #8   Title  Pt will be able to stand up and walk throughout clinic 300 feet, without BP elevation >25 mm Hg systolic to show    Baseline  30 mm Hg or more    Time  8    Period  Weeks    Status  New    Target Date  08/16/19            Plan - 06/28/19 1538    Clinical Impression Statement  Patient pleased with the amount of info she received form cardiologist. At this time, they are adjusting her meds and hopefully that will help a great deal with her symptoms.  She did have an elevated BP  after riding hte bike today (recumbant, 141/100).  Chose a mat program today to keep that in check.  She had no other symptoms.  She will benefit from continued monitoring as she transitions to a new medicine.  She is trying to up her activity but truly feels bad when she has worked a full day, likely due to elevated BP and continuous tachycardia.  Hypermobility is also an issue and she needs to learn techniques to support her joints, improve proprioception.  Will cont POC.  Fax new referral to Dr. Lanae Boast to cover both of these diagnoses.    Personal Factors and Comorbidities  Time since onset of  injury/illness/exacerbation;Other    Examination-Activity Limitations  Stand;Locomotion Level;Other    Examination-Participation Restrictions  Community Activity    Stability/Clinical Decision Making  Evolving/Moderate complexity    Rehab Potential  Excellent    PT Frequency  1x / week    PT Duration  8 weeks    PT Treatment/Interventions  ADLs/Self Care Home Management;Therapeutic activities;Patient/family education;Therapeutic exercise;Other (comment)    PT Next Visit Plan  lifting, squatting, cardio prior, standing, core. INTERVALS, EMOM with step ups. Monitor BP throughout    PT Home Exercise Plan  4 way SLR, calf raise, bridge, supine knee lift (core), bridge with clam, march, bird dog. Supine 90/90 extension and dead bug for core. Tall kneeling (hinge, shoulder flex/ext) and seated ball/core    Consulted and Agree with Plan of Care  Patient       Patient will benefit from skilled therapeutic intervention in order to improve the following deficits and impairments:  Postural dysfunction, Decreased activity tolerance, Cardiopulmonary status limiting activity, Decreased mobility, Dizziness  Visit Diagnosis: POTS (postural orthostatic tachycardia syndrome)  Dizziness and giddiness     Problem List Patient Active Problem List   Diagnosis Date Noted  . Lymphadenopathy 02/10/2011    Mylasia Vorhees 06/28/2019, 5:24 PM  [facility] [address] [place], [place], [address] Phone: [phone]   Fax:  [phone]  Name: [place]ta [name] MRN: [id number] Date of Birth: 21-May-1990  Karie Mainland, PT 06/28/19 5:24 PM Phone: [phone] Fax: [phone]

## 2019-07-05 ENCOUNTER — Ambulatory Visit: Payer: Managed Care, Other (non HMO) | Admitting: Physical Therapy

## 2019-07-05 ENCOUNTER — Other Ambulatory Visit: Payer: Self-pay

## 2019-07-05 VITALS — BP 137/93 | HR 110

## 2019-07-05 DIAGNOSIS — G90A Postural orthostatic tachycardia syndrome (POTS): Secondary | ICD-10-CM

## 2019-07-05 DIAGNOSIS — M357 Hypermobility syndrome: Secondary | ICD-10-CM

## 2019-07-05 DIAGNOSIS — I498 Other specified cardiac arrhythmias: Secondary | ICD-10-CM | POA: Diagnosis not present

## 2019-07-05 DIAGNOSIS — R42 Dizziness and giddiness: Secondary | ICD-10-CM

## 2019-07-05 NOTE — Therapy (Signed)
[place], [place], [address] Phone: [phone]   Fax:  [phone]  Physical Therapy Treatment  Patient Details  Name: Roberta [name] MRN: [id number] Date of Birth: 12-16-1990 Referring Provider (PT): Dr. Deland Pretty    Encounter Date: 07/05/2019  PT End of Session - 07/05/19 1247    Visit Number  33    Number of Visits  40    Date for PT Re-Evaluation  08/16/19    PT Start Time  1232    PT Stop Time  1316    PT Time Calculation (min)  44 min    Activity Tolerance  Patient tolerated treatment well    Behavior During Therapy  [facility] for tasks assessed/performed       No past medical history on file.  No past surgical history on file.  Vitals:   07/05/19 1236 07/05/19 1245  BP: (!) 126/94 (!) 137/93  Pulse: 94 (!) 110    Subjective Assessment - 07/05/19 1257    Subjective  Pt states she has not started hernew med.  Trying to get from [place] due to cost.  No pain today. Highest BP she has had was 130/115.    Pertinent History  POTS    Currently in Pain?  No/denies           [facility] Adult PT Treatment/Exercise - 07/05/19 0001      Lumbar Exercises: Aerobic   Stationary Bike  5 min L2 interval climb HR up to 127 bpm post RPE 14         Pilates Reformer used for LE/core strength, postural strength, lumbopelvic disassociation and core control.  Exercises included:  Footwork 4 springs with ball between knees x 20 parallel toes, parallel heels, turnout    Cues for adduction and pelvic neutral  Bridging  With ball x 10, Add clams , alternating x 10 each leg, rest break needed with partial ROM  Kneeling arms , sidefacing 1 blue diagonal and shoulder flexion x 10 each   Max cues for spine stability and avoiding sidebending   modifed spring tension for weakness and compensations  Standing lunge 1 blue back leg x 10 then front leg x 10 each side Plank no springs poor control, done on elbows and knees  15 sec x 3 with min cues      PT Education - 07/05/19 1258    Education Details  Bristol Impact Scale    Person(s) Educated  Patient    Methods  Explanation    Comprehension  Verbalized understanding;Returned demonstration       PT Short Term Goals - 02/22/19 1235      PT SHORT TERM GOAL #1   Title  Pt will be consistent and I with HEP for core, large muscle group strengthening    Status  Achieved      PT SHORT TERM GOAL #2   Title  Pt will use RPE and HR monitor (Fitbit) to monitor self while doing cardio    Status  Achieved      PT SHORT TERM GOAL #3   Title  Pt will be able to report feeling less fatigue post work, able to do simple core work after a few hours of rest.    Status  Achieved        PT Long Term Goals - 06/28/19 1342      PT LONG TERM GOAL #1   Title  Pt will be able to show  Independence with HEP and consistent cardio routine    Baseline  1-2 times per week , weekends    Status  On-going    Target Date  08/16/19      PT LONG TERM GOAL #2   Title  Patient will be able to squat, lift up to 15 lbs x 10 with no pain, just min fatigue    Status  Achieved    Target Date  08/16/19      PT LONG TERM GOAL #3   Title  Pt will be able to see 2 patients at work consecutively without need to rest, document.    Status  Achieved    Target Date  08/16/19      PT LONG TERM GOAL #4   Title  Pt will be able to complete 30 min of cardio without undue fatigue    Baseline  20-25 min , walking mostly and response varies    Status  On-going    Target Date  08/16/19      PT LONG TERM GOAL #5   Title  Pt will be able to show 5/5 strength in core and LEs    Baseline  R hip abd 4/5, bilat. glute med 3+/5, bilateral glute max 4/5 , bilat. adductor 3-/5. L. hip abd 4+/5    Status  On-going    Target Date  08/16/19      Additional Long Term Goals   Additional Long Term Goals  Yes      PT LONG TERM GOAL #6   Title  Pt will be able to take 2 flights of stairs to access  home, work multiple floors without excessive fatigue    Baseline  takes slowly, rests, HR in 120's    Status  On-going    Target Date  08/16/19      PT LONG TERM GOAL #7   Title  Patient will be able to tolerate change in elevation with exercises (plank to standing) with no more than min difficulty.    Baseline  dizzy with each attenpt    Status  On-going    Target Date  08/16/19      PT LONG TERM GOAL #8   Title  Pt will be able to stand up and walk throughout clinic 300 feet, without BP elevation >10 mm Hg systolic to show    Baseline  30 mm Hg or more    Time  8    Period  Weeks    Status  New    Target Date  08/16/19            Plan - 07/05/19 1325    Clinical Impression Statement  BP better controlled today due to keeping exercises mostly horizontal. She needs cue for stability throughout exercises. She may get an abdominal binder for when she is working but will need to still be mindful of core for support vs external device.    PT Treatment/Interventions  ADLs/Self Care Home Management;Therapeutic activities;Patient/family education;Therapeutic exercise;Other (comment)    PT Next Visit Plan  pilates, lifting, squatting, cardio prior, standing, core. INTERVALS, EMOM with step ups. Monitor BP throughout    PT Home Exercise Plan  4 way SLR, calf raise, bridge, supine knee lift (core), bridge with clam, march, bird dog. Supine 90/90 extension and dead bug for core. Tall kneeling (hinge, shoulder flex/ext) and seated ball/core    Consulted and Agree with Plan of Care  Patient       Patient will  benefit from skilled therapeutic intervention in order to improve the following deficits and impairments:  Postural dysfunction, Decreased activity tolerance, Cardiopulmonary status limiting activity, Decreased mobility, Dizziness  Visit Diagnosis: POTS (postural orthostatic tachycardia syndrome)  Dizziness and giddiness  Hypermobility syndrome     Problem List Patient Active  Problem List   Diagnosis Date Noted  . Lymphadenopathy 02/10/2011    Keagon Glascoe 07/05/2019, 1:31 PM  [facility] [address] [place], [place], [address] Phone: [phone]   Fax:  [phone]  Name: SHIYA FOGELMAN MRN: [id number] Date of Birth: 11-18-1990  Karie Mainland, PT 07/05/19 1:31 PM Phone: [phone] Fax: [phone]

## 2019-07-12 ENCOUNTER — Ambulatory Visit: Payer: Managed Care, Other (non HMO) | Admitting: Physical Therapy

## 2019-07-12 ENCOUNTER — Other Ambulatory Visit: Payer: Self-pay

## 2019-07-12 VITALS — BP 163/64 | HR 89

## 2019-07-12 DIAGNOSIS — R42 Dizziness and giddiness: Secondary | ICD-10-CM

## 2019-07-12 DIAGNOSIS — M357 Hypermobility syndrome: Secondary | ICD-10-CM

## 2019-07-12 DIAGNOSIS — I498 Other specified cardiac arrhythmias: Secondary | ICD-10-CM

## 2019-07-12 DIAGNOSIS — G90A Postural orthostatic tachycardia syndrome (POTS): Secondary | ICD-10-CM

## 2019-07-12 NOTE — Therapy (Signed)
[facility] [address] [place], [place], [address] Phone: [phone]   Fax:  [phone]  Physical Therapy Treatment  Patient Details  Name: Roberta [name] MRN: [id number] Date of Birth: [birth year] Referring Provider (PT): Dr. Merri Brunette    Encounter Date: 07/12/2019  PT End of Session - 07/12/19 1414    Visit Number  34    Number of Visits  40    Date for PT Re-Evaluation  08/16/19    PT Start Time  1401    PT Stop Time  1445    PT Time Calculation (min)  44 min       No past medical history on file.  No past surgical history on file.  Vitals:   07/12/19 1408  BP: (!) 163/64  Pulse: 89  SpO2: 99%    Subjective Assessment - 07/12/19 1414    Subjective  Pt brought in her abdominal binder. No complaints today. I walked a mile this AM.            [facility] Adult PT Treatment/Exercise - 07/12/19 0001      Lumbar Exercises: Aerobic   Elliptical  5 min L 12 ramp, L1 resist.     Other Aerobic Exercise  BP 175/70      Lumbar Exercises: Supine   Heel Slides  10 reps    Bent Knee Raise  15 reps    Bridge  15 reps    Bridge Limitations  ball squeeze     Other Supine Lumbar Exercises  supine core with small ab ball:     Other Supine Lumbar Exercises  UE with 5 lbs dumbbell       Lumbar Exercises: Sidelying   Other Sidelying Lumbar Exercises  dead bug x 10 , side plank 10 sec x 3       Knee/Hip Exercises: Standing   Other Standing Knee Exercises  surrenders lunge to knee and rise with same foot x 5 , BP 130/97               PT Short Term Goals - 02/22/19 1235      PT SHORT TERM GOAL #1   Title  Pt will be consistent and I with HEP for core, large muscle group strengthening    Status  Achieved      PT SHORT TERM GOAL #2   Title  Pt will use RPE and HR monitor (Fitbit) to monitor self while doing cardio    Status  Achieved      PT SHORT TERM GOAL #3   Title  Pt will be able to report feeling less  fatigue post work, able to do simple core work after a few hours of rest.    Status  Achieved        PT Long Term Goals - 06/28/19 1342      PT LONG TERM GOAL #1   Title  Pt will be able to show Independence with HEP and consistent cardio routine    Baseline  1-2 times per week , weekends    Status  On-going    Target Date  08/16/19      PT LONG TERM GOAL #2   Title  Patient will be able to squat, lift up to 15 lbs x 10 with no pain, just min fatigue    Status  Achieved    Target Date  08/16/19      PT LONG TERM GOAL #3   Title  Pt will be able to see 2 patients at work consecutively without need to rest, document.    Status  Achieved    Target Date  08/16/19      PT LONG TERM GOAL #4   Title  Pt will be able to complete 30 min of cardio without undue fatigue    Baseline  20-25 min , walking mostly and response varies    Status  On-going    Target Date  08/16/19      PT LONG TERM GOAL #5   Title  Pt will be able to show 5/5 strength in core and LEs    Baseline  R hip abd 4/5, bilat. glute med 3+/5, bilateral glute max 4/5 , bilat. adductor 3-/5. L. hip abd 4+/5    Status  On-going    Target Date  08/16/19      Additional Long Term Goals   Additional Long Term Goals  Yes      PT LONG TERM GOAL #6   Title  Pt will be able to take 2 flights of stairs to access home, work multiple floors without excessive fatigue    Baseline  takes slowly, rests, HR in 120's    Status  On-going    Target Date  08/16/19      PT LONG TERM GOAL #7   Title  Patient will be able to tolerate change in elevation with exercises (plank to standing) with no more than min difficulty.    Baseline  dizzy with each attenpt    Status  On-going    Target Date  08/16/19      PT LONG TERM GOAL #8   Title  Pt will be able to stand up and walk throughout clinic 300 feet, without BP elevation >10 mm Hg systolic to show    Baseline  30 mm Hg or more    Time  8    Period  Weeks    Status  New    Target  Date  08/16/19            Plan - 07/12/19 1441    Clinical Impression Statement  PT increased (SBP) to 175 with standing/tall kneeling. Stayed with mat core exercises as a result.  End of session 148/66 in supine. She wore her abdominal binder today.  Headache with elliptical. Felt better after sesson.    PT Treatment/Interventions  ADLs/Self Care Home Management;Therapeutic activities;Patient/family education;Therapeutic exercise;Other (comment)    PT Next Visit Plan  pilates, lifting, squatting, cardio prior, standing, core. INTERVALS, EMOM with step ups. Monitor BP throughout    PT Home Exercise Plan  4 way SLR, calf raise, bridge, supine knee lift (core), bridge with clam, march, bird dog. Supine 90/90 extension and dead bug for core. Tall kneeling (hinge, shoulder flex/ext) and seated ball/core    Consulted and Agree with Plan of Care  Patient       Patient will benefit from skilled therapeutic intervention in order to improve the following deficits and impairments:  Postural dysfunction, Decreased activity tolerance, Cardiopulmonary status limiting activity, Decreased mobility, Dizziness  Visit Diagnosis: POTS (postural orthostatic tachycardia syndrome)  Dizziness and giddiness  Hypermobility syndrome     Problem List Patient Active Problem List   Diagnosis Date Noted  . Lymphadenopathy 02/10/2011    Achillies Buehl 07/12/2019, 5:21 PM  [facility] [address] [place], [place], [address] Phone: [phone]   Fax:  [phone]  Name: Roberta [name] MRN: [id number]  Date of Birth: December 27, 1990  [name], PT 07/12/19 5:22 PM Phone: [phone] Fax: [phone]

## 2019-07-19 ENCOUNTER — Encounter: Payer: Self-pay | Admitting: Physical Therapy

## 2019-07-19 ENCOUNTER — Other Ambulatory Visit: Payer: Self-pay

## 2019-07-19 ENCOUNTER — Ambulatory Visit: Payer: Managed Care, Other (non HMO) | Admitting: Physical Therapy

## 2019-07-19 VITALS — BP 132/87

## 2019-07-19 DIAGNOSIS — I498 Other specified cardiac arrhythmias: Secondary | ICD-10-CM | POA: Diagnosis not present

## 2019-07-19 DIAGNOSIS — G90A Postural orthostatic tachycardia syndrome (POTS): Secondary | ICD-10-CM

## 2019-07-19 DIAGNOSIS — R42 Dizziness and giddiness: Secondary | ICD-10-CM

## 2019-07-19 DIAGNOSIS — M357 Hypermobility syndrome: Secondary | ICD-10-CM

## 2019-07-19 NOTE — Therapy (Signed)
[place], [place], [address] Phone: [phone]   Fax:  [phone]  Physical Therapy Treatment  Patient Details  Name: Roberta [name] MRN: [id number] Date of Birth: Mar 02, 1991 Referring Provider (PT): Dr. Deland Pretty    Encounter Date: 07/19/2019  PT End of Session - 07/19/19 1407    Visit Number  35    Number of Visits  40    Date for PT Re-Evaluation  08/16/19    PT Start Time  4166    PT Stop Time  1400    PT Time Calculation (min)  47 min    Activity Tolerance  Patient tolerated treatment well    Behavior During Therapy  [facility] for tasks assessed/performed       History reviewed. No pertinent past medical history.  History reviewed. No pertinent surgical history.  Vitals:   07/19/19 1357  BP: 132/87    Subjective Assessment - 07/19/19 1315    Subjective  Been wearing an abdominal binder.  BP resting 131/94. Still waiting for meds.  I want to do Pilates. I took a walk this AM.    Currently in Pain?  No/denies         [facility] Adult PT Treatment/Exercise - 07/19/19 0001      Lumbar Exercises: Aerobic   Nustep  L8 LE only for 8 min , decreased tension to L2          Pilates Reformer used for LE/core strength, postural strength, lumbopelvic disassociation and core control.  Exercises included:    Seated long box 1 Red or 1 Blue   Seated roll down x 10  Row in C curve x 10 Red     Bicep curl x 10   Horizontal abd x 8   Angels 1 blue x 5  Standing lunge 1 Blue  Press back knee on carriage then knee off, x 10  Footwork  2 Red 1 Blue   Double leg with ball squeeze, focus on timing of L quad activation  Toes, narrow, parallel and then turned out  Single leg x 10 parallel then turned out x 10   PT Education - 07/19/19 1407    Education Details  medial quad on LLE    Person(s) Educated  Patient    Methods  Explanation;Verbal cues    Comprehension  Verbalized understanding;Need further  instruction       PT Short Term Goals - 02/22/19 1235      PT SHORT TERM GOAL #1   Title  Pt will be consistent and I with HEP for core, large muscle group strengthening    Status  Achieved      PT SHORT TERM GOAL #2   Title  Pt will use RPE and HR monitor (Fitbit) to monitor self while doing cardio    Status  Achieved      PT SHORT TERM GOAL #3   Title  Pt will be able to report feeling less fatigue post work, able to do simple core work after a few hours of rest.    Status  Achieved        PT Long Term Goals - 06/28/19 1342      PT LONG TERM GOAL #1   Title  Pt will be able to show Independence with HEP and consistent cardio routine    Baseline  1-2 times per week , weekends    Status  On-going    Target Date  08/16/19  PT LONG TERM GOAL #2   Title  Patient will be able to squat, lift up to 15 lbs x 10 with no pain, just min fatigue    Status  Achieved    Target Date  08/16/19      PT LONG TERM GOAL #3   Title  Pt will be able to see 2 patients at work consecutively without need to rest, document.    Status  Achieved    Target Date  08/16/19      PT LONG TERM GOAL #4   Title  Pt will be able to complete 30 min of cardio without undue fatigue    Baseline  20-25 min , walking mostly and response varies    Status  On-going    Target Date  08/16/19      PT LONG TERM GOAL #5   Title  Pt will be able to show 5/5 strength in core and LEs    Baseline  R hip abd 4/5, bilat. glute med 3+/5, bilateral glute max 4/5 , bilat. adductor 3-/5. L. hip abd 4+/5    Status  On-going    Target Date  08/16/19      Additional Long Term Goals   Additional Long Term Goals  Yes      PT LONG TERM GOAL #6   Title  Pt will be able to take 2 flights of stairs to access home, work multiple floors without excessive fatigue    Baseline  takes slowly, rests, HR in 120's    Status  On-going    Target Date  08/16/19      PT LONG TERM GOAL #7   Title  Patient will be able to tolerate  change in elevation with exercises (plank to standing) with no more than min difficulty.    Baseline  dizzy with each attenpt    Status  On-going    Target Date  08/16/19      PT LONG TERM GOAL #8   Title  Pt will be able to stand up and walk throughout clinic 300 feet, without BP elevation >10 mm Hg systolic to show    Baseline  30 mm Hg or more    Time  8    Period  Weeks    Status  New    Target Date  08/16/19            Plan - 07/19/19 1408    Clinical Impression Statement  Pt with more stable BP today.  Worked with PIlates environment to [employer] upright spine, level pelvis and avoid overextension of low back, hips to compensate for weakness. L knee showing some weakness in TKE.    PT Treatment/Interventions  ADLs/Self Care Home Management;Therapeutic activities;Patient/family education;Therapeutic exercise;Other (comment)    PT Next Visit Plan  SLR for VMO.  pilates, lifting, squatting, cardio prior, standing, core. INTERVALS, EMOM with step ups. Monitor BP throughout    PT Home Exercise Plan  4 way SLR, calf raise, bridge, supine knee lift (core), bridge with clam, march, bird dog. Supine 90/90 extension and dead bug for core. Tall kneeling (hinge, shoulder flex/ext) and seated ball/core    Consulted and Agree with Plan of Care  Patient       Patient will benefit from skilled therapeutic intervention in order to improve the following deficits and impairments:  Postural dysfunction, Decreased activity tolerance, Cardiopulmonary status limiting activity, Decreased mobility, Dizziness  Visit Diagnosis: POTS (postural orthostatic tachycardia syndrome)  Dizziness and giddiness  Hypermobility syndrome     Problem List Patient Active Problem List   Diagnosis Date Noted  . Lymphadenopathy 02/10/2011    Benuel Ly 07/19/2019, 2:11 PM  [facility] [address] [place], [place], [address] Phone: [phone]   Fax:   [phone]  Name: Roberta [name] MRN: [id number] Date of Birth: 08-25-1990  Karie Mainland, PT 07/19/19 2:11 PM Phone: [phone] Fax: [phone]

## 2019-07-26 ENCOUNTER — Other Ambulatory Visit: Payer: Self-pay

## 2019-07-26 ENCOUNTER — Encounter: Payer: Self-pay | Admitting: Physical Therapy

## 2019-07-26 ENCOUNTER — Ambulatory Visit: Payer: Managed Care, Other (non HMO) | Attending: Internal Medicine | Admitting: Physical Therapy

## 2019-07-26 VITALS — BP 146/85

## 2019-07-26 DIAGNOSIS — I498 Other specified cardiac arrhythmias: Secondary | ICD-10-CM | POA: Insufficient documentation

## 2019-07-26 DIAGNOSIS — R42 Dizziness and giddiness: Secondary | ICD-10-CM | POA: Insufficient documentation

## 2019-07-26 DIAGNOSIS — G90A Postural orthostatic tachycardia syndrome (POTS): Secondary | ICD-10-CM

## 2019-07-26 DIAGNOSIS — M357 Hypermobility syndrome: Secondary | ICD-10-CM | POA: Insufficient documentation

## 2019-07-26 NOTE — Therapy (Signed)
[facility] [address] [place], [place], [address] Phone: [phone]   Fax:  [phone]  Physical Therapy Treatment  Patient Details  Name: Roberta [name] MRN: [id number] Date of Birth: Mar 31, 1990 Referring Provider (PT): Dr. Merri Brunette    Encounter Date: 07/26/2019  PT End of Session - 07/26/19 1531    Visit Number  36    Number of Visits  40    Date for PT Re-Evaluation  08/16/19    PT Start Time  1447    PT Stop Time  1530    PT Time Calculation (min)  43 min    Activity Tolerance  Patient tolerated treatment well    Behavior During Therapy  [facility] for tasks assessed/performed       History reviewed. No pertinent past medical history.  History reviewed. No pertinent surgical history.  Vitals:   07/26/19 1500 07/26/19 1501 07/26/19 1517 07/26/19 1518  BP: (!) 132/98 135/80 (!) 141/117 (!) 146/85    Subjective Assessment - 07/26/19 1455    Subjective  Tues was horrible.  I had a headache and nausea and incr HR all day.  Wears Binder sometimes.  They say I am the opposite , I am not supposed to take salt due to HTN.    Pertinent History  POTS, hyperadrenergic type.    Currently in Pain?  No/denies       [facility] Adult PT Treatment/Exercise - 07/26/19 0001      Lumbar Exercises: Machines for Strengthening   Cybex Knee Extension  25 lbs 2 x 10 reps     Cybex Knee Flexion  35 lbs 2 x 10     Leg Press  45 lbs x 20    Other Lumbar Machine Exercise  row 2 x 10, 30 lbs,  chest press  2 x 10 , 25 lbs       Lumbar Exercises: Supine   Bent Knee Raise Limitations  reverse toe taps 90/90 2 sets of 10, improved with hands under tailbone       Knee/Hip Exercises: Supine   Bridges  Strengthening;Both;1 set;15 reps    Bridges with [employer]  1 set;15 reps    Single Leg Bridge  Strengthening;Both;1 set;10 reps      8 min recumbant bike L2 intervals , HR 117-120, Sa O2 98%     PT Short Term Goals - 02/22/19 1235      PT  SHORT TERM GOAL #1   Title  Pt will be consistent and I with HEP for core, large muscle group strengthening    Status  Achieved      PT SHORT TERM GOAL #2   Title  Pt will use RPE and HR monitor (Fitbit) to monitor self while doing cardio    Status  Achieved      PT SHORT TERM GOAL #3   Title  Pt will be able to report feeling less fatigue post work, able to do simple core work after a few hours of rest.    Status  Achieved        PT Long Term Goals - 06/28/19 1342      PT LONG TERM GOAL #1   Title  Pt will be able to show Independence with HEP and consistent cardio routine    Baseline  1-2 times per week , weekends    Status  On-going    Target Date  08/16/19      PT LONG TERM GOAL #  2   Title  Patient will be able to squat, lift up to 15 lbs x 10 with no pain, just min fatigue    Status  Achieved    Target Date  08/16/19      PT LONG TERM GOAL #3   Title  Pt will be able to see 2 patients at work consecutively without need to rest, document.    Status  Achieved    Target Date  08/16/19      PT LONG TERM GOAL #4   Title  Pt will be able to complete 30 min of cardio without undue fatigue    Baseline  20-25 min , walking mostly and response varies    Status  On-going    Target Date  08/16/19      PT LONG TERM GOAL #5   Title  Pt will be able to show 5/5 strength in core and LEs    Baseline  R hip abd 4/5, bilat. glute med 3+/5, bilateral glute max 4/5 , bilat. adductor 3-/5. L. hip abd 4+/5    Status  On-going    Target Date  08/16/19      Additional Long Term Goals   Additional Long Term Goals  Yes      PT LONG TERM GOAL #6   Title  Pt will be able to take 2 flights of stairs to access home, work multiple floors without excessive fatigue    Baseline  takes slowly, rests, HR in 120's    Status  On-going    Target Date  08/16/19      PT LONG TERM GOAL #7   Title  Patient will be able to tolerate change in elevation with exercises (plank to standing) with no more  than min difficulty.    Baseline  dizzy with each attenpt    Status  On-going    Target Date  08/16/19      PT LONG TERM GOAL #8   Title  Pt will be able to stand up and walk throughout clinic 300 feet, without BP elevation >32 mm Hg systolic to show    Baseline  30 mm Hg or more    Time  8    Period  Weeks    Status  New    Target Date  08/16/19            Plan - 07/26/19 1531    Clinical Impression Statement  Increased BP with strength machines (DBP 117) and symptomatic.   Returned to normal with supine.  Hopeful that her new med will help her feel better during the day at work.  She is planning a reduction in hours to help her symptoms.    PT Treatment/Interventions  ADLs/Self Care Home Management;Therapeutic activities;Patient/family education;Therapeutic exercise;Other (comment)    PT Next Visit Plan  SLR for VMO.  pilates, lifting, squatting, cardio prior, standing, core. INTERVALS, EMOM with step ups. Monitor BP throughout    PT Home Exercise Plan  4 way SLR, calf raise, bridge, supine knee lift (core), bridge with clam, march, bird dog. Supine 90/90 extension and dead bug for core. Tall kneeling (hinge, shoulder flex/ext) and seated ball/core    Consulted and Agree with Plan of Care  Patient       Patient will benefit from skilled therapeutic intervention in order to improve the following deficits and impairments:  Postural dysfunction, Decreased activity tolerance, Cardiopulmonary status limiting activity, Decreased mobility, Dizziness  Visit Diagnosis: POTS (postural orthostatic tachycardia  syndrome)  Hypermobility syndrome  Dizziness and giddiness     Problem List Patient Active Problem List   Diagnosis Date Noted  . Lymphadenopathy 02/10/2011    Roberta [name] 07/26/2019, 3:33 PM  [facility] [address] [place], [place], [address] Phone: [phone]   Fax:  [phone]  Name: Roberta [name] MRN:  [id number] Date of Birth: 12/26/1990  Karie Mainland, PT 07/26/19 3:34 PM Phone: [phone] Fax: [phone]

## 2019-08-02 ENCOUNTER — Ambulatory Visit: Payer: Managed Care, Other (non HMO) | Admitting: Physical Therapy

## 2019-08-02 ENCOUNTER — Other Ambulatory Visit: Payer: Self-pay

## 2019-08-02 VITALS — BP 154/59 | HR 87

## 2019-08-02 DIAGNOSIS — I498 Other specified cardiac arrhythmias: Secondary | ICD-10-CM

## 2019-08-02 DIAGNOSIS — M357 Hypermobility syndrome: Secondary | ICD-10-CM

## 2019-08-02 DIAGNOSIS — R42 Dizziness and giddiness: Secondary | ICD-10-CM

## 2019-08-02 DIAGNOSIS — G90A Postural orthostatic tachycardia syndrome (POTS): Secondary | ICD-10-CM

## 2019-08-02 NOTE — Therapy (Signed)
[facility] Outpatient Rehabilitation [facility] [address] [place], [place], [address] Phone: [phone]   Fax:  [phone]  Physical Therapy Treatment  Patient Details  Name: Roberta [name] MRN: [id number] Date of Birth: 1990-06-22 Referring Provider (PT): Dr. Merri Brunette    Encounter Date: 08/02/2019  PT End of Session - 08/02/19 1322    Visit Number  37    Number of Visits  40    Date for PT Re-Evaluation  08/16/19    PT Start Time  1315    PT Stop Time  1358    PT Time Calculation (min)  43 min    Activity Tolerance  Patient tolerated treatment well    Behavior During Therapy  [facility] for tasks assessed/performed       No past medical history on file.  No past surgical history on file.  Vitals:   08/02/19 1317  BP: (!) 154/59  Pulse: 87  SpO2: 98%    Subjective Assessment - 08/02/19 1318    Subjective  I have log for my BP.  They want to be able to take me off my Beta Blockers. 127/75.  No pain, was very tired after Pilates.    Currently in Pain?  No/denies         [facility] Adult PT Treatment/Exercise - 08/02/19 0001      Lumbar Exercises: Aerobic   Stationary Bike  7 min L2 intervals       Lumbar Exercises: Standing   Other Standing Lumbar Exercises  standing BOSU exercises : split squat static hold, added bicep curl 3 lbs bilateral x 15 2 x sets     Other Standing Lumbar Exercises  double leg on BOSU on round top squat with UE assist x 15 , flat top single leg balance with semicircles x 10 each LE, each direction       Lumbar Exercises: Quadruped   Other Quadruped Lumbar Exercises  inchworms x 5 on yoga mat, knees bent, slow pace , sl. dizzy    Other Quadruped Lumbar Exercises  ball walk out facing up x 5       Knee/Hip Exercises: Standing   Hip Abduction  Stengthening;Both;1 set;15 reps    Abduction Limitations  on foam with green band     Hip Extension  Stengthening;Both;1 set;15 reps    Extension Limitations  foam pad with green band               PT Education - 08/02/19 1552    Education Details  core, balance    Person(s) Educated  Patient    Methods  Explanation;Demonstration    Comprehension  Verbalized understanding;Returned demonstration       PT Short Term Goals - 02/22/19 1235      PT SHORT TERM GOAL #1   Title  Pt will be consistent and I with HEP for core, large muscle group strengthening    Status  Achieved      PT SHORT TERM GOAL #2   Title  Pt will use RPE and HR monitor (Fitbit) to monitor self while doing cardio    Status  Achieved      PT SHORT TERM GOAL #3   Title  Pt will be able to report feeling less fatigue post work, able to do simple core work after a few hours of rest.    Status  Achieved        PT Long Term Goals - 06/28/19 1342  PT LONG TERM GOAL #1   Title  Pt will be able to show Independence with HEP and consistent cardio routine    Baseline  1-2 times per week , weekends    Status  On-going    Target Date  08/16/19      PT LONG TERM GOAL #2   Title  Patient will be able to squat, lift up to 15 lbs x 10 with no pain, just min fatigue    Status  Achieved    Target Date  08/16/19      PT LONG TERM GOAL #3   Title  Pt will be able to see 2 patients at work consecutively without need to rest, document.    Status  Achieved    Target Date  08/16/19      PT LONG TERM GOAL #4   Title  Pt will be able to complete 30 min of cardio without undue fatigue    Baseline  20-25 min , walking mostly and response varies    Status  On-going    Target Date  08/16/19      PT LONG TERM GOAL #5   Title  Pt will be able to show 5/5 strength in core and LEs    Baseline  R hip abd 4/5, bilat. glute med 3+/5, bilateral glute max 4/5 , bilat. adductor 3-/5. L. hip abd 4+/5    Status  On-going    Target Date  08/16/19      Additional Long Term Goals   Additional Long Term Goals  Yes      PT LONG TERM GOAL #6   Title  Pt will be able to take 2 flights of stairs to access home,  work multiple floors without excessive fatigue    Baseline  takes slowly, rests, HR in 120's    Status  On-going    Target Date  08/16/19      PT LONG TERM GOAL #7   Title  Patient will be able to tolerate change in elevation with exercises (plank to standing) with no more than min difficulty.    Baseline  dizzy with each attenpt    Status  On-going    Target Date  08/16/19      PT LONG TERM GOAL #8   Title  Pt will be able to stand up and walk throughout clinic 300 feet, without BP elevation >10 mm Hg systolic to show    Baseline  30 mm Hg or more    Time  8    Period  Weeks    Status  New    Target Date  08/16/19            Plan - 08/02/19 1323    Clinical Impression Statement  Patient did well today, new medicine is  having a positive effect on her energy levels.  She will also have more recovery time as she is not working weekends any longer.  Worked on elevation changes , mod dizziness today with plank walk out. Balance is poor and often loses balance at work, leans posteriorly and has trouble correcting.    PT Treatment/Interventions  ADLs/Self Care Home Management;Therapeutic activities;Patient/family education;Therapeutic exercise;Other (comment)    PT Next Visit Plan  BAlance assessments? SLR for VMO.  pilates, lifting, squatting, cardio prior, standing, core. INTERVALS, EMOM with step ups. Monitor BP throughout    PT Home Exercise Plan  4 way SLR, calf raise, bridge, supine knee lift (core), bridge with clam,  march, bird dog. Supine 90/90 extension and dead bug for core. Tall kneeling (hinge, shoulder flex/ext) and seated ball/core    Consulted and Agree with Plan of Care  Patient       Patient will benefit from skilled therapeutic intervention in order to improve the following deficits and impairments:  Postural dysfunction, Decreased activity tolerance, Cardiopulmonary status limiting activity, Decreased mobility, Dizziness  Visit Diagnosis: No diagnosis  found.     Problem List Patient Active Problem List   Diagnosis Date Noted  . Lymphadenopathy 02/10/2011    Grete Bosko 08/02/2019, 3:54 PM  [facility] [address] [place], [place], [address] Phone: [phone]   Fax:  [phone]  Name: Roberta [name] MRN: [id number] Date of Birth: 26-Jul-1990  [name], PT 08/02/19 3:55 PM Phone: [phone] Fax: [phone]

## 2019-08-09 ENCOUNTER — Ambulatory Visit: Payer: Managed Care, Other (non HMO) | Admitting: Physical Therapy

## 2019-08-09 ENCOUNTER — Other Ambulatory Visit: Payer: Self-pay

## 2019-08-09 VITALS — BP 144/84 | HR 112

## 2019-08-09 DIAGNOSIS — R42 Dizziness and giddiness: Secondary | ICD-10-CM

## 2019-08-09 DIAGNOSIS — G90A Postural orthostatic tachycardia syndrome (POTS): Secondary | ICD-10-CM

## 2019-08-09 DIAGNOSIS — I498 Other specified cardiac arrhythmias: Secondary | ICD-10-CM

## 2019-08-09 DIAGNOSIS — M357 Hypermobility syndrome: Secondary | ICD-10-CM

## 2019-08-09 NOTE — Therapy (Signed)
[facility] Outpatient Rehabilitation [facility] [address] [place], [place], [address] Phone: [phone]   Fax:  [phone]  Physical Therapy Treatment/Renewal  Patient Details  Name: Roberta [name] MRN: [id number] Date of Birth: 1990/05/19 Referring Provider (PT): Dr. Merri Brunette    Encounter Date: 08/09/2019  PT End of Session - 08/09/19 1316    Visit Number  38    Number of Visits  46    Date for PT Re-Evaluation  10/04/19    PT Start Time  1312    PT Stop Time  1359    PT Time Calculation (min)  47 min    Activity Tolerance  Patient tolerated treatment well    Behavior During Therapy  [facility] for tasks assessed/performed       No past medical history on file.  No past surgical history on file.  Vitals:   08/09/19 1315 08/09/19 1357  BP: 130/67 (!) 144/84  Pulse: 89 (!) 112    Subjective Assessment - 08/09/19 1314    Subjective  No pain or complaints. Has not been exercising.  I have been so nauseous.         [facility] PT Assessment - 08/09/19 0001      Strength   Right Hip ABduction  4+/5    Right Hip ADduction  4/5    Left Hip Extension  5/5    Left Hip ABduction  4+/5    Left Hip ADduction  4/5    Right Knee Flexion  5/5    Right Knee Extension  5/5    Left Knee Flexion  4+/5    Left Knee Extension  4+/5         [facility] Adult PT Treatment/Exercise - 08/09/19 0001      Standardized Balance Assessment   Standardized Balance Assessment  Berg Balance Test      Berg Balance Test   Sit to Stand  Able to stand without using hands and stabilize independently    Standing Unsupported  Able to stand safely 2 minutes    Sitting with Back Unsupported but Feet Supported on Floor or Stool  Able to sit safely and securely 2 minutes    Stand to Sit  Sits safely with minimal use of hands    Transfers  Able to transfer safely, minor use of hands    Standing Unsupported with Eyes Closed  Able to stand 10 seconds safely   increased sway R/L and A/P    Standing Ubsupported with Feet Together  Able to place feet together independently and stand 1 minute safely    From Standing, Reach Forward with Outstretched Arm  Can reach confidently >25 cm (10")    From Standing Position, Pick up Object from Floor  Able to pick up shoe safely and easily    From Standing Position, Turn to Look Behind Over each Shoulder  Looks behind from both sides and weight shifts well    Turn 360 Degrees  Able to turn 360 degrees safely in 4 seconds or less    Standing Unsupported, Alternately Place Feet on Step/Stool  Able to stand independently and safely and complete 8 steps in 20 seconds    Standing Unsupported, One Foot in Front  Able to place foot tandem independently and hold 30 seconds    Standing on One Leg  Able to lift leg independently and hold > 10 seconds    Total Score  56      Lumbar Exercises: Aerobic  Nustep  L8 UE and LE for 8 min RPE 13 and 50-70 steps per minute       Lumbar Exercises: Seated   Other Seated Lumbar Exercises  seated core and UE see notes      Seated Physioball: x 10   Seated bicep curl and forward raise with 5 lbs   Seated lateral raise and reverse fly with 3 lbs  Toe/heel raise   Knee extension x10  Hip flexion x 10  Opposite arm/opposite leg x 10   Trunk rotation with 5 lbs dumbbell x 5 each side, cues for posture and core for all exercises.   Would benefit from visual  cueing for feedback at home.        PT Education - 08/09/19 1400    Education Details  progress, goals, posture with seated core work , Soil scientist) Educated  Patient    Methods  Explanation;Demonstration;Verbal cues    Comprehension  Verbalized understanding;Verbal cues required       PT Short Term Goals - 02/22/19 1235      PT SHORT TERM GOAL #1   Title  Pt will be consistent and I with HEP for core, large muscle group strengthening    Status  Achieved      PT SHORT TERM GOAL #2   Title  Pt will use RPE and HR monitor (Fitbit)  to monitor self while doing cardio    Status  Achieved      PT SHORT TERM GOAL #3   Title  Pt will be able to report feeling less fatigue post work, able to do simple core work after a few hours of rest.    Status  Achieved        PT Long Term Goals - 08/09/19 1317      PT LONG TERM GOAL #1   Title  Pt will be able to show Independence with HEP and consistent cardio routine    Baseline  does HEP 4 x per week, cardio 1 x week on days off    Target Date  10/04/19      PT LONG TERM GOAL #2   Title  Patient will be able to squat, lift up to 15 lbs x 10 with no pain, just min fatigue    Status  Achieved    Target Date  10/04/19      PT LONG TERM GOAL #3   Title  Pt will be able to see 2 patients at work consecutively without need to rest, document.    Status  Achieved    Target Date  10/04/19      PT LONG TERM GOAL #4   Title  Pt will be able to complete 30 min of cardio without undue fatigue    Baseline  can do this but very tired    Status  On-going    Target Date  10/04/19      PT LONG TERM GOAL #5   Title  Pt will be able to show 5/5 strength in core and LEs    Baseline  R/L  hip abd 4+/5,  , bilat. adductor 4-/5. , bilateral hip ext 5/5    Time  8    Period  Weeks    Status  On-going    Target Date  10/04/19      PT LONG TERM GOAL #6   Title  Pt will be able to take 2 flights of stairs to access home, work  multiple floors without excessive fatigue    Baseline  can do 1 flight with min fatigue    Status  On-going      PT LONG TERM GOAL #7   Title  Patient will be able to tolerate change in elevation with exercises (plank to standing) with no more than min difficulty.    Status  On-going    Target Date  10/04/19      PT LONG TERM GOAL #8   Title  Pt will be able to stand up and walk throughout clinic 300 feet, without BP elevation >10 mm Hg systolic to show improved conditioning    Baseline  30 mm Hg (130 systolic up to 161 with a quick 1.5 min walk (300 feet)     Status  On-going    Target Date  10/04/19            Plan - 08/09/19 1314    Clinical Impression Statement  Roberta [name] continues to do well, but is not able to tolerate more than her HEP on days she works.  She is stronger in her hips but continues to have core weakness and balance issues as fatigue increases.  She leans posteriorly in standing to compensate for increased HR (this is subconscious but her doctor told her he has seen this before).  She is more aware of her posture with ADLs.  RHR 90-100 bpm, up 122 with cardio.  MD would like to her to continue PT until he sees her in July.    Personal Factors and Comorbidities  Time since onset of injury/illness/exacerbation;Other    Examination-Activity Limitations  Stand;Locomotion Level;Other    Examination-Participation Restrictions  Community Activity    Stability/Clinical Decision Making  Evolving/Moderate complexity    Rehab Potential  Excellent    PT Frequency  1x / week    PT Duration  8 weeks    PT Treatment/Interventions  ADLs/Self Care Home Management;Therapeutic activities;Patient/family education;Therapeutic exercise;Other (comment)    PT Next Visit Plan  Dynamic Gait Index.  SLR for VMO.  pilates, lifting, squatting, cardio prior, standing, core. INTERVALS, EMOM with step ups. Monitor BP throughout    PT Home Exercise Plan  4 way SLR, calf raise, bridge, supine knee lift (core), bridge with clam, march, bird dog. Supine 90/90 extension and dead bug for core. Tall kneeling (hinge, shoulder flex/ext) and seated ball/core    Consulted and Agree with Plan of Care  Patient       Patient will benefit from skilled therapeutic intervention in order to improve the following deficits and impairments:  Postural dysfunction, Decreased activity tolerance, Cardiopulmonary status limiting activity, Decreased mobility, Dizziness  Visit Diagnosis: POTS (postural orthostatic tachycardia syndrome)  Dizziness and giddiness  Hypermobility  syndrome     Problem List Patient Active Problem List   Diagnosis Date Noted  . Lymphadenopathy 02/10/2011    Kennah Hehr 08/09/2019, 2:04 PM  [facility] Outpatient Rehabilitation [facility] [address] [place], [place], [address] Phone: [phone]   Fax:  [phone]  Name: Roberta [name] MRN: [id number] Date of Birth: 01/26/1991  Karie Mainland, PT 08/09/19 2:05 PM Phone: [phone] Fax: [phone]

## 2019-08-15 ENCOUNTER — Other Ambulatory Visit: Payer: Self-pay | Admitting: Gastroenterology

## 2019-08-15 DIAGNOSIS — R1319 Other dysphagia: Secondary | ICD-10-CM

## 2019-08-15 DIAGNOSIS — R110 Nausea: Secondary | ICD-10-CM

## 2019-08-16 ENCOUNTER — Ambulatory Visit: Payer: Managed Care, Other (non HMO) | Admitting: Physical Therapy

## 2019-08-16 ENCOUNTER — Other Ambulatory Visit: Payer: Self-pay

## 2019-08-16 ENCOUNTER — Encounter: Payer: Self-pay | Admitting: Physical Therapy

## 2019-08-16 VITALS — BP 158/94 | HR 91

## 2019-08-16 DIAGNOSIS — I498 Other specified cardiac arrhythmias: Secondary | ICD-10-CM

## 2019-08-16 DIAGNOSIS — R42 Dizziness and giddiness: Secondary | ICD-10-CM

## 2019-08-16 DIAGNOSIS — G90A Postural orthostatic tachycardia syndrome (POTS): Secondary | ICD-10-CM

## 2019-08-16 DIAGNOSIS — M357 Hypermobility syndrome: Secondary | ICD-10-CM

## 2019-08-16 NOTE — Therapy (Signed)
[place], [place], [address] Phone: [phone]   Fax:  [phone]  Physical Therapy Treatment  Patient Details  Name: Roberta [name] MRN: [id number] Date of Birth: [birth year] Referring Provider (PT): Dr. Deland Pretty    Encounter Date: 08/16/2019  PT End of Session - 08/16/19 1133    Visit Number  39    Number of Visits  46    Date for PT Re-Evaluation  10/04/19    PT Start Time  1134    PT Stop Time  1224    PT Time Calculation (min)  50 min    Activity Tolerance  Patient tolerated treatment well    Behavior During Therapy  [facility] for tasks assessed/performed       History reviewed. No pertinent past medical history.  History reviewed. No pertinent surgical history.  Vitals:   08/16/19 1140  BP: (!) 158/94  Pulse: 91  SpO2: 97%    Subjective Assessment - 08/16/19 1134    Subjective  "no pain or issues today"    Patient Stated Goals  Patient would like to have mroe energy and strength    Currently in Pain?  No/denies         [facility] PT Assessment - 08/16/19 0001      Assessment   Medical Diagnosis  POTS    Referring Provider (PT)  Dr. Deland Pretty                               PT Short Term Goals - 02/22/19 1235      PT SHORT TERM GOAL #1   Title  Pt will be consistent and I with HEP for core, large muscle group strengthening    Status  Achieved      PT SHORT TERM GOAL #2   Title  Pt will use RPE and HR monitor (Fitbit) to monitor self while doing cardio    Status  Achieved      PT SHORT TERM GOAL #3   Title  Pt will be able to report feeling less fatigue post work, able to do simple core work after a few hours of rest.    Status  Achieved        PT Long Term Goals - 08/09/19 1317      PT LONG TERM GOAL #1   Title  Pt will be able to show Independence with HEP and consistent cardio routine    Baseline  does HEP 4 x per week, cardio 1 x week on days off     Target Date  10/04/19      PT LONG TERM GOAL #2   Title  Patient will be able to squat, lift up to 15 lbs x 10 with no pain, just min fatigue    Status  Achieved    Target Date  10/04/19      PT LONG TERM GOAL #3   Title  Pt will be able to see 2 patients at work consecutively without need to rest, document.    Status  Achieved    Target Date  10/04/19      PT LONG TERM GOAL #4   Title  Pt will be able to complete 30 min of cardio without undue fatigue    Baseline  can do this but very tired    Status  On-going    Target Date  10/04/19  PT LONG TERM GOAL #5   Title  Pt will be able to show 5/5 strength in core and LEs    Baseline  R/L  hip abd 4+/5,  , bilat. adductor 4-/5. , bilateral hip ext 5/5    Time  8    Period  Weeks    Status  On-going    Target Date  10/04/19      PT LONG TERM GOAL #6   Title  Pt will be able to take 2 flights of stairs to access home, work multiple floors without excessive fatigue    Baseline  can do 1 flight with min fatigue    Status  On-going      PT LONG TERM GOAL #7   Title  Patient will be able to tolerate change in elevation with exercises (plank to standing) with no more than min difficulty.    Status  On-going    Target Date  10/04/19      PT LONG TERM GOAL #8   Title  Pt will be able to stand up and walk throughout clinic 300 feet, without BP elevation >10 mm Hg systolic to show improved conditioning    Baseline  30 mm Hg (130 systolic up to 161 with a quick 1.5 min walk (300 feet)    Status  On-going    Target Date  10/04/19         halfway through session 98 % 104 BPM 146/95  End of session 97% 91 BPM 114/91  Circuit training: 2 rounds Seated marching on green physioball (EMOM) Seated on green physioball 5# (EMOM) Bridges with arms crossed (EMOM) Supine marching (EMOM) 1 x 15 kettlebell deadlift with 10# Romberg position - black physioball ABC's     Plan - 08/16/19 1351    Clinical Impression Statement   Pt reports no issues coming into treatment. Utilized Hydrologist EMOM in multiple positions seated, standing and supine working UE/LE and core activation. She did well with exercises but did fatigue quickly. She reported RPE stayed around 14 throughout session. Updated HEP today to include seated marching on physioball.    PT Next Visit Plan  Dynamic Gait Index.  SLR for VMO.  pilates, lifting, squatting, cardio prior, standing, core. INTERVALS, EMOM with step ups. Monitor BP throughout    PT Home Exercise Plan  4 way SLR, calf raise, bridge, supine knee lift (core), bridge with clam, march, bird dog. Supine 90/90 extension and dead bug for core. Tall kneeling (hinge, shoulder flex/ext) and seated ball/core, seated core strengthening.    Consulted and Agree with Plan of Care  Patient       Patient will benefit from skilled therapeutic intervention in order to improve the following deficits and impairments:  Postural dysfunction, Decreased activity tolerance, Cardiopulmonary status limiting activity, Decreased mobility, Dizziness  Visit Diagnosis: POTS (postural orthostatic tachycardia syndrome)  Dizziness and giddiness  Hypermobility syndrome     Problem List Patient Active Problem List   Diagnosis Date Noted  . Lymphadenopathy 02/10/2011    Lulu Riding PT, DPT, LAT, ATC  08/16/19  1:54 PM      [facility] Outpatient Rehabilitation [facility] [address] [place], [place], [address] Phone: [phone]   Fax:  [phone]  Name: Roberta [name] MRN: [id number] Date of Birth: [birth year]

## 2019-08-23 ENCOUNTER — Encounter: Payer: Self-pay | Admitting: Physical Therapy

## 2019-08-23 ENCOUNTER — Ambulatory Visit: Payer: Managed Care, Other (non HMO) | Attending: Internal Medicine | Admitting: Physical Therapy

## 2019-08-23 ENCOUNTER — Other Ambulatory Visit: Payer: Self-pay

## 2019-08-23 ENCOUNTER — Other Ambulatory Visit: Payer: Self-pay | Admitting: Gastroenterology

## 2019-08-23 ENCOUNTER — Other Ambulatory Visit (HOSPITAL_COMMUNITY): Payer: Self-pay | Admitting: Gastroenterology

## 2019-08-23 VITALS — BP 145/90 | HR 105

## 2019-08-23 DIAGNOSIS — G90A Postural orthostatic tachycardia syndrome (POTS): Secondary | ICD-10-CM

## 2019-08-23 DIAGNOSIS — R42 Dizziness and giddiness: Secondary | ICD-10-CM | POA: Diagnosis present

## 2019-08-23 DIAGNOSIS — M357 Hypermobility syndrome: Secondary | ICD-10-CM | POA: Diagnosis present

## 2019-08-23 DIAGNOSIS — I498 Other specified cardiac arrhythmias: Secondary | ICD-10-CM | POA: Diagnosis present

## 2019-08-23 DIAGNOSIS — R110 Nausea: Secondary | ICD-10-CM

## 2019-08-23 NOTE — Therapy (Signed)
[facility] [address] [place], [place], [address] Phone: [phone]   Fax:  [phone]  Physical Therapy Treatment  Patient Details  Name: Roberta [name] MRN: [id number] Date of Birth: 1990/07/05 Referring Provider (PT): Dr. Merri Brunette    Encounter Date: 08/23/2019  PT End of Session - 08/23/19 0929    Visit Number  40    Number of Visits  46    Date for PT Re-Evaluation  10/04/19    PT Start Time  0930    PT Stop Time  1014    PT Time Calculation (min)  44 min    Activity Tolerance  Patient tolerated treatment well    Behavior During Therapy  [facility] for tasks assessed/performed       History reviewed. No pertinent past medical history.  History reviewed. No pertinent surgical history.  Vitals:   08/23/19 0933 08/23/19 1026  BP: (!) 135/96 (!) 145/90  Pulse: 77 (!) 105  SpO2: 93% 99%    Subjective Assessment - 08/23/19 0932    Subjective  "I was tired after the last session"    Patient Stated Goals  Patient would like to have mroe energy and strength    Currently in Pain?  No/denies                        [facility] Adult PT Treatment/Exercise - 08/23/19 0001      Lumbar Exercises: Aerobic   Nustep  L8 UE and LE for 8 min RPE 13 and 50-70 steps per minute , 97% 106BPM   cues to avoid letting LLE rotate inward while pressing down      Circuit training: 2 rounds each.  Reformer: Foot work with 2 reds 1 blue in each position (EMOM) UE circles in table top position with 2 reds (EMOM)  Sit to stand x 15 - intermittent verbal cues to avoid medial collapse of knees and posterior trunk lean Step-up/ down on 6 inch step (EMOM) - Cues/ demonstration for foot/ knee alignment due to RLE internally rotatinng/ medial collapse Rhomberg balance x 1 min on airex pad       PT Education - 08/23/19 1021    Education Details  reviewed step up biomechanics keeping knee in line with foot to reduce stress on  the knee. updated HEP for corner balance.    Person(s) Educated  Patient    Methods  Explanation;Verbal cues;Handout    Comprehension  Verbalized understanding;Verbal cues required       PT Short Term Goals - 02/22/19 1235      PT SHORT TERM GOAL #1   Title  Pt will be consistent and I with HEP for core, large muscle group strengthening    Status  Achieved      PT SHORT TERM GOAL #2   Title  Pt will use RPE and HR monitor (Fitbit) to monitor self while doing cardio    Status  Achieved      PT SHORT TERM GOAL #3   Title  Pt will be able to report feeling less fatigue post work, able to do simple core work after a few hours of rest.    Status  Achieved        PT Long Term Goals - 08/09/19 1317      PT LONG TERM GOAL #1   Title  Pt will be able to show Independence with HEP and consistent cardio routine    Baseline  does HEP 4 x per week, cardio 1 x week on days off    Target Date  10/04/19      PT LONG TERM GOAL #2   Title  Patient will be able to squat, lift up to 15 lbs x 10 with no pain, just min fatigue    Status  Achieved    Target Date  10/04/19      PT LONG TERM GOAL #3   Title  Pt will be able to see 2 patients at work consecutively without need to rest, document.    Status  Achieved    Target Date  10/04/19      PT LONG TERM GOAL #4   Title  Pt will be able to complete 30 min of cardio without undue fatigue    Baseline  can do this but very tired    Status  On-going    Target Date  10/04/19      PT LONG TERM GOAL #5   Title  Pt will be able to show 5/5 strength in core and LEs    Baseline  R/L  hip abd 4+/5,  , bilat. adductor 4-/5. , bilateral hip ext 5/5    Time  8    Period  Weeks    Status  On-going    Target Date  10/04/19      PT LONG TERM GOAL #6   Title  Pt will be able to take 2 flights of stairs to access home, work multiple floors without excessive fatigue    Baseline  can do 1 flight with min fatigue    Status  On-going      PT LONG TERM  GOAL #7   Title  Patient will be able to tolerate change in elevation with exercises (plank to standing) with no more than min difficulty.    Status  On-going    Target Date  10/04/19      PT LONG TERM GOAL #8   Title  Pt will be able to stand up and walk throughout clinic 300 feet, without BP elevation >10 mm Hg systolic to show improved conditioning    Baseline  30 mm Hg (130 systolic up to 161 with a quick 1.5 min walk (300 feet)    Status  On-going    Target Date  10/04/19            Plan - 08/23/19 1022    Clinical Impression Statement  pt noted feeling tired following the last session otherwise reported no pain. continued utilzing curcuit training combined with the reformer working both LE and UE while keeping core contracted, followed with sit to stand, step ups and rhomberg balance. intermittent cues needed to avoid posterior trunk lean and foot/ knee position with stepping up.  BP stayed around 145/90 taken manually today throughout session, and RPE stayed at 13/20   see note for exercise.   PT Next Visit Plan  Dynamic Gait Index.  SLR for VMO.  pilates, lifting, squatting, cardio prior, standing, core. INTERVALS, EMOM with step ups. Monitor BP throughout    PT Home Exercise Plan  4 way SLR, calf raise, bridge, supine knee lift (core), bridge with clam, march, bird dog. Supine 90/90 extension and dead bug for core. Tall kneeling (hinge, shoulder flex/ext) and seated ball/core, seated core strengthening. corner balance       Patient will benefit from skilled therapeutic intervention in order to improve the following deficits and impairments:  Postural dysfunction, Decreased  activity tolerance, Cardiopulmonary status limiting activity, Decreased mobility, Dizziness  Visit Diagnosis: POTS (postural orthostatic tachycardia syndrome)  Dizziness and giddiness  Hypermobility syndrome     Problem List Patient Active Problem List   Diagnosis Date Noted  . Lymphadenopathy  02/10/2011   Starr Lake PT, DPT, LAT, ATC  08/23/19  10:32 AM      [facility] [address] [place], [place], [address] Phone: [phone]   Fax:  [phone]  Name: Roberta [name] MRN: [id number] Date of Birth: 01/30/1991

## 2019-08-30 ENCOUNTER — Other Ambulatory Visit: Payer: Self-pay

## 2019-08-30 ENCOUNTER — Ambulatory Visit
Admission: RE | Admit: 2019-08-30 | Discharge: 2019-08-30 | Disposition: A | Discharge status: Home / Self Care | Payer: Managed Care, Other (non HMO) | Source: Ambulatory Visit | Attending: Gastroenterology | Admitting: Gastroenterology

## 2019-08-30 ENCOUNTER — Ambulatory Visit: Payer: Managed Care, Other (non HMO) | Admitting: Physical Therapy

## 2019-08-30 VITALS — BP 141/92

## 2019-08-30 DIAGNOSIS — M357 Hypermobility syndrome: Secondary | ICD-10-CM

## 2019-08-30 DIAGNOSIS — I498 Other specified cardiac arrhythmias: Secondary | ICD-10-CM | POA: Diagnosis not present

## 2019-08-30 DIAGNOSIS — R110 Nausea: Secondary | ICD-10-CM

## 2019-08-30 DIAGNOSIS — G90A Postural orthostatic tachycardia syndrome (POTS): Secondary | ICD-10-CM

## 2019-08-30 DIAGNOSIS — R42 Dizziness and giddiness: Secondary | ICD-10-CM

## 2019-08-30 DIAGNOSIS — R1319 Other dysphagia: Secondary | ICD-10-CM

## 2019-08-30 NOTE — Patient Instructions (Signed)
Access Code: [email or web address]: [email or web address]: 06/10/2021Prepared by: Victorino Dike PaaExercises  Tall Kneeling Posterior Pelvic Tilt - 1 x daily - 7 x weekly - 1 sets - 10 reps - 5 hold  Tall Kneeling Trunk Rotations with Weighted Ball (BKA) - 1 x daily - 3 x weekly - 2 sets - 10 reps - 5 hold  Tall Kneeling Chop with Medicine Ball - 1 x daily - 3 x weekly - 2 sets - 10 reps - 5 hold

## 2019-08-30 NOTE — Therapy (Signed)
[facility] Outpatient Rehabilitation [facility] [address] [place], [place], [address] Phone: [phone]   Fax:  [phone]  Physical Therapy Treatment  Patient Details  Name: Roberta [name] MRN: [id number] Date of Birth: December 14, 1990 Referring Provider (PT): Dr. Merri Brunette    Encounter Date: 08/30/2019   PT End of Session - 08/30/19 1355    Visit Number 41    Number of Visits 46    Date for PT Re-Evaluation 10/04/19    PT Start Time 1320    PT Stop Time 1403    PT Time Calculation (min) 43 min    Activity Tolerance Patient tolerated treatment well;Other (comment);Treatment limited secondary to medical complications (Comment)   nausea, high BP   Behavior During Therapy [facility] for tasks assessed/performed           No past medical history on file.  No past surgical history on file.  Vitals:   08/30/19 1354  BP: (!) 141/92     Subjective Assessment - 08/30/19 1358    Subjective I am all over the place at work.  I walk differently, I sway when I walk. I am so tired when I get home.    Patient Stated Goals Patient would like to have more energy and strength    Currently in Pain? No/denies             [facility] Adult PT Treatment/Exercise - 08/30/19 0001      Lumbar Exercises: Stretches   Lower Trunk Rotation Limitations knees crossed end of session x 5 each       Lumbar Exercises: Aerobic   Nustep LE only L 7, 8 min       Lumbar Exercises: Standing   Lifting Limitations hip hinge single leg with 1-2 UE support max cues for maintaining stability     Other Standing Lumbar Exercises sit to stand with green band 2 x 10 added heel raise     Other Standing Lumbar Exercises tall kneeling single arm overhead press,  hinge back with 4 lbs wgt and then shoulder press 3 way x 10 (all with 4 lbs ) with rest breaks       Lumbar Exercises: Supine   Clam Limitations  x 20 green  band unilateral       Lumbar Exercises: Quadruped   Other Quadruped Lumbar Exercises  tall kneeling chop x 10 eachc side 5 lbs     Other Quadruped Lumbar Exercises child pose                   PT Education - 08/30/19 1400    Education Details tall kneeling for core, sway back correction    Person(s) Educated Patient    Methods Explanation;Demonstration;Verbal cues    Comprehension Verbalized understanding;Returned demonstration            PT Short Term Goals - 02/22/19 1235      PT SHORT TERM GOAL #1   Title Pt will be consistent and I with HEP for core, large muscle group strengthening    Status Achieved      PT SHORT TERM GOAL #2   Title Pt will use RPE and HR monitor (Fitbit) to monitor self while doing cardio    Status Achieved      PT SHORT TERM GOAL #3   Title Pt will be able to report feeling less fatigue post work, able to do simple core work after a few hours of rest.    Status Achieved  PT Long Term Goals - 08/09/19 1317      PT LONG TERM GOAL #1   Title Pt will be able to show Independence with HEP and consistent cardio routine    Baseline does HEP 4 x per week, cardio 1 x week on days off    Target Date 10/04/19      PT LONG TERM GOAL #2   Title Patient will be able to squat, lift up to 15 lbs x 10 with no pain, just min fatigue    Status Achieved    Target Date 10/04/19      PT LONG TERM GOAL #3   Title Pt will be able to see 2 patients at work consecutively without need to rest, document.    Status Achieved    Target Date 10/04/19      PT LONG TERM GOAL #4   Title Pt will be able to complete 30 min of cardio without undue fatigue    Baseline can do this but very tired    Status On-going    Target Date 10/04/19      PT LONG TERM GOAL #5   Title Pt will be able to show 5/5 strength in core and LEs    Baseline R/L  hip abd 4+/5,  , bilat. adductor 4-/5. , bilateral hip ext 5/5    Time 8    Period Weeks    Status On-going    Target Date 10/04/19      PT LONG TERM GOAL #6   Title Pt will be able to take 2  flights of stairs to access home, work multiple floors without excessive fatigue    Baseline can do 1 flight with min fatigue    Status On-going      PT LONG TERM GOAL #7   Title Patient will be able to tolerate change in elevation with exercises (plank to standing) with no more than min difficulty.    Status On-going    Target Date 10/04/19      PT LONG TERM GOAL #8   Title Pt will be able to stand up and walk throughout clinic 300 feet, without BP elevation >10 mm Hg systolic to show improved conditioning    Baseline 30 mm Hg (130 systolic up to 161 with a quick 1.5 min walk (300 feet)    Status On-going    Target Date 10/04/19                 Plan - 08/30/19 1407    Clinical Impression Statement Patient with nausea today throughout session , having GI issues vs POTS flare up.  Worked on core stability and balance throughout session, LE alignment.  Maintained BP throughout session 130-140/86-90.    PT Treatment/Interventions ADLs/Self Care Home Management;Therapeutic activities;Patient/family education;Therapeutic exercise;Other (comment)    PT Next Visit Plan Dynamic Gait Index.  SLR for VMO.  pilates, lifting, squatting, cardio prior, standing, core. INTERVALS, EMOM with step ups. Monitor BP throughout    PT Home Exercise Plan 4 way SLR, calf raise, bridge, supine knee lift (core), bridge with clam, march, bird dog. Supine 90/90 extension and dead bug for core. Tall kneeling (hinge, shoulder flex/ext) and seated ball/core, seated core strengthening. corner balance and tall kneeling           Patient will benefit from skilled therapeutic intervention in order to improve the following deficits and impairments:  Postural dysfunction, Decreased activity tolerance, Cardiopulmonary status limiting activity, Decreased mobility, Dizziness  Visit  Diagnosis: POTS (postural orthostatic tachycardia syndrome)  Dizziness and giddiness  Hypermobility syndrome     Problem  List Patient Active Problem List   Diagnosis Date Noted  . Lymphadenopathy 02/10/2011    Hasana Alcorta 08/30/2019, 2:14 PM  [facility] [address] [place], [place], [address] Phone: [phone]   Fax:  [phone]  Name: ZHOE CATANIA MRN: [id number] Date of Birth: May 09, 1990  [name], PT 08/30/19 2:14 PM Phone: [phone] Fax: [phone]

## 2019-09-06 ENCOUNTER — Ambulatory Visit: Payer: Managed Care, Other (non HMO) | Admitting: Physical Therapy

## 2019-09-13 ENCOUNTER — Encounter: Payer: Self-pay | Admitting: Physical Therapy

## 2019-09-13 ENCOUNTER — Other Ambulatory Visit: Payer: Self-pay

## 2019-09-13 ENCOUNTER — Encounter (HOSPITAL_COMMUNITY)
Admission: RE | Admit: 2019-09-13 | Discharge: 2019-09-13 | Disposition: A | Discharge status: Home / Self Care | Payer: Managed Care, Other (non HMO) | Source: Ambulatory Visit | Attending: Gastroenterology | Admitting: Gastroenterology

## 2019-09-13 ENCOUNTER — Ambulatory Visit: Payer: Managed Care, Other (non HMO) | Admitting: Physical Therapy

## 2019-09-13 VITALS — BP 142/79 | HR 98

## 2019-09-13 DIAGNOSIS — R110 Nausea: Secondary | ICD-10-CM | POA: Insufficient documentation

## 2019-09-13 DIAGNOSIS — I498 Other specified cardiac arrhythmias: Secondary | ICD-10-CM | POA: Diagnosis not present

## 2019-09-13 DIAGNOSIS — M357 Hypermobility syndrome: Secondary | ICD-10-CM

## 2019-09-13 DIAGNOSIS — R42 Dizziness and giddiness: Secondary | ICD-10-CM

## 2019-09-13 DIAGNOSIS — G90A Postural orthostatic tachycardia syndrome (POTS): Secondary | ICD-10-CM

## 2019-09-13 MED ORDER — TECHNETIUM TC 99M SULFUR COLLOID
2.1000 | Freq: Once | INTRAVENOUS | Status: AC | PRN
  Administered 2019-09-13: 2.1 via INTRAVENOUS

## 2019-09-13 NOTE — Therapy (Signed)
[facility] Outpatient Rehabilitation [facility] [address] [place], [place], [address] Phone: [phone]   Fax:  [phone]  Physical Therapy Treatment  Patient Details  Name: Roberta [name] MRN: [id number] Date of Birth: 03/27/1990 Referring Provider (PT): Dr. Merri Brunette    Encounter Date: 09/13/2019   PT End of Session - 09/13/19 1328    Visit Number 42    Number of Visits 46    Date for PT Re-Evaluation 10/04/19    PT Start Time 1318    PT Stop Time 1405    PT Time Calculation (min) 47 min    Activity Tolerance Patient tolerated treatment well;Other (comment);Treatment limited secondary to medical complications (Comment)    Behavior During Therapy [facility] for tasks assessed/performed           History reviewed. No pertinent past medical history.  History reviewed. No pertinent surgical history.  Vitals:   09/13/19 1327 09/13/19 1342  BP: 140/90 (!) 142/79  Pulse: 98   SpO2: 98%      Subjective Assessment - 09/13/19 1323    Subjective Now I can recognize I am swaying back.  I used to not know.  Had gastric emptying this AM.    Currently in Pain? No/denies                  [facility] Adult PT Treatment/Exercise - 09/13/19 0001      Lumbar Exercises: Aerobic   Recumbent Bike 6 min L1, BP 186/83 post bike.  RPE 5.       Lumbar Exercises: Seated   Sit to Stand 15 reps    Sit to Stand Limitations with ball cues to avoid sway , then squats same cues       Lumbar Exercises: Supine   Bridge with Ball Squeeze Limitations x 15 then 10 sec hold     Basic Lumbar Stabilization 10 reps    Advanced Lumbar Stabilization Limitations bent knee raise, SLR and clam with ball under hips     Straight Leg Raise 10 reps    Other Supine Lumbar Exercises 90/90 abs hold x 30 sec then toe taps       Lumbar Exercises: Quadruped   Opposite Arm/Leg Raise Right arm/Left leg;Left arm/Right leg;10 reps    Opposite Arm/Leg Raise Limitations knee to elbow x 10 each leg                    PT Education - 09/13/19 1435    Education Details support group for POTS/contact    Person(s) Educated Patient    Methods Explanation    Comprehension Verbalized understanding            PT Short Term Goals - 02/22/19 1235      PT SHORT TERM GOAL #1   Title Pt will be consistent and I with HEP for core, large muscle group strengthening    Status Achieved      PT SHORT TERM GOAL #2   Title Pt will use RPE and HR monitor (Fitbit) to monitor self while doing cardio    Status Achieved      PT SHORT TERM GOAL #3   Title Pt will be able to report feeling less fatigue post work, able to do simple core work after a few hours of rest.    Status Achieved             PT Long Term Goals - 08/09/19 1317      PT LONG  TERM GOAL #1   Title Pt will be able to show Independence with HEP and consistent cardio routine    Baseline does HEP 4 x per week, cardio 1 x week on days off    Target Date 10/04/19      PT LONG TERM GOAL #2   Title Patient will be able to squat, lift up to 15 lbs x 10 with no pain, just min fatigue    Status Achieved    Target Date 10/04/19      PT LONG TERM GOAL #3   Title Pt will be able to see 2 patients at work consecutively without need to rest, document.    Status Achieved    Target Date 10/04/19      PT LONG TERM GOAL #4   Title Pt will be able to complete 30 min of cardio without undue fatigue    Baseline can do this but very tired    Status On-going    Target Date 10/04/19      PT LONG TERM GOAL #5   Title Pt will be able to show 5/5 strength in core and LEs    Baseline R/L  hip abd 4+/5,  , bilat. adductor 4-/5. , bilateral hip ext 5/5    Time 8    Period Weeks    Status On-going    Target Date 10/04/19      PT LONG TERM GOAL #6   Title Pt will be able to take 2 flights of stairs to access home, work multiple floors without excessive fatigue    Baseline can do 1 flight with min fatigue    Status On-going      PT  LONG TERM GOAL #7   Title Patient will be able to tolerate change in elevation with exercises (plank to standing) with no more than min difficulty.    Status On-going    Target Date 10/04/19      PT LONG TERM GOAL #8   Title Pt will be able to stand up and walk throughout clinic 300 feet, without BP elevation >10 mm Hg systolic to show improved conditioning    Baseline 30 mm Hg (130 systolic up to 161 with a quick 1.5 min walk (300 feet)    Status On-going    Target Date 10/04/19                 Plan - 09/13/19 1438    Clinical Impression Statement Patient with elevated BP today, was unable to take her meds due to her gastric emptying test.  Worked mostly on core in supine to improve core stability, hoping it will translate to less sway back in standing at work. Sees MD in 2 weeks.  Patient also expressed some psychosocial difficulties she is having related to her impairments, wants to know what she could have done differently to prevent this.  I encouraged her to ask her doctor but also that this is not related to a choice or something she has done in the past.    PT Next Visit Plan Dynamic Gait Index.  SLR for VMO.  pilates, lifting, squatting, cardio prior, standing, core. INTERVALS, EMOM with step ups. Monitor BP throughout    PT Home Exercise Plan 4 way SLR, calf raise, bridge, supine knee lift (core), bridge with clam, march, bird dog. Supine 90/90 extension and dead bug for core. Tall kneeling (hinge, shoulder flex/ext) and seated ball/core, seated core strengthening. corner balance and tall kneeling  Consulted and Agree with Plan of Care Patient           Patient will benefit from skilled therapeutic intervention in order to improve the following deficits and impairments:  Postural dysfunction, Decreased activity tolerance, Cardiopulmonary status limiting activity, Decreased mobility, Dizziness  Visit Diagnosis: POTS (postural orthostatic tachycardia syndrome)  Dizziness  and giddiness  Hypermobility syndrome     Problem List Patient Active Problem List   Diagnosis Date Noted  . Lymphadenopathy 02/10/2011    Shaquon Gropp 09/13/2019, 2:46 PM  [facility] [address] [place], [place], [address] Phone: [phone]   Fax:  [phone]  Name: KATRYN PLUMMER MRN: [id number] Date of Birth: [birth year]   [name], PT 09/13/19 2:47 PM Phone: [phone] Fax: [phone]

## 2019-09-20 ENCOUNTER — Other Ambulatory Visit: Payer: Self-pay

## 2019-09-20 ENCOUNTER — Ambulatory Visit: Payer: Managed Care, Other (non HMO) | Attending: Internal Medicine | Admitting: Physical Therapy

## 2019-09-20 VITALS — BP 123/93 | HR 80

## 2019-09-20 DIAGNOSIS — I498 Other specified cardiac arrhythmias: Secondary | ICD-10-CM | POA: Diagnosis present

## 2019-09-20 DIAGNOSIS — R42 Dizziness and giddiness: Secondary | ICD-10-CM

## 2019-09-20 DIAGNOSIS — G90A Postural orthostatic tachycardia syndrome (POTS): Secondary | ICD-10-CM

## 2019-09-20 DIAGNOSIS — M357 Hypermobility syndrome: Secondary | ICD-10-CM | POA: Diagnosis present

## 2019-09-20 NOTE — Therapy (Signed)
[facility] Outpatient Rehabilitation [facility] [address] [place], [place], [address] Phone: [phone]   Fax:  [phone]  Physical Therapy Treatment  Patient Details  Name: Roberta [name] MRN: [id number] Date of Birth: June 09, 1990 Referring Provider (PT): Dr. Merri Brunette    Encounter Date: 09/20/2019   PT End of Session - 09/20/19 1709    Visit Number 43    Number of Visits 46    Date for PT Re-Evaluation 10/04/19    PT Start Time 1700    PT Stop Time 1747    PT Time Calculation (min) 47 min    Activity Tolerance Patient tolerated treatment well    Behavior During Therapy [facility] for tasks assessed/performed           No past medical history on file.  No past surgical history on file.  Vitals:   09/20/19 1709 09/20/19 1710 09/20/19 1735 09/20/19 1736  BP: (!) 142/90 128/83 (!) 157/128 (!) 123/93  Pulse: 80        Subjective Assessment - 09/20/19 1705    Subjective Worked today.  Ankles and knee joints pop alot.  I am seeing [facility] Tuesday.  Tired but no pain .    Currently in Pain? No/denies             [facility] Adult PT Treatment/Exercise - 09/20/19 0001      Lumbar Exercises: Aerobic   Nustep L5 RPE 5 UE and LE       Lumbar Exercises: Standing   Other Standing Lumbar Exercises SLS with ball raises       Lumbar Exercises: Seated   Long Arc Quad on [place] Strengthening;Both;1 set;10 reps    Other Seated Lumbar Exercises physioball: Rotation, marching, dead bug       Lumbar Exercises: Prone   Other Prone Lumbar Exercises modified burpees/plank x 8 on sie of mat table       Knee/Hip Exercises: Standing   Forward Lunges Both;1 set;10 reps    Forward Lunges Limitations back lunge x 10 1 set     Functional Squat 2 sets;10 reps    Functional Squat Limitations add overhead press x 10     Other Standing Knee Exercises triceps 5 lbs x 15                     PT Short Term Goals - 02/22/19 1235      PT SHORT TERM GOAL #1    Title Pt will be consistent and I with HEP for core, large muscle group strengthening    Status Achieved      PT SHORT TERM GOAL #2   Title Pt will use RPE and HR monitor (Fitbit) to monitor self while doing cardio    Status Achieved      PT SHORT TERM GOAL #3   Title Pt will be able to report feeling less fatigue post work, able to do simple core work after a few hours of rest.    Status Achieved             PT Long Term Goals - 08/09/19 1317      PT LONG TERM GOAL #1   Title Pt will be able to show Independence with HEP and consistent cardio routine    Baseline does HEP 4 x per week, cardio 1 x week on days off    Target Date 10/04/19      PT LONG TERM GOAL #2  Title Patient will be able to squat, lift up to 15 lbs x 10 with no pain, just min fatigue    Status Achieved    Target Date 10/04/19      PT LONG TERM GOAL #3   Title Pt will be able to see 2 patients at work consecutively without need to rest, document.    Status Achieved    Target Date 10/04/19      PT LONG TERM GOAL #4   Title Pt will be able to complete 30 min of cardio without undue fatigue    Baseline can do this but very tired    Status On-going    Target Date 10/04/19      PT LONG TERM GOAL #5   Title Pt will be able to show 5/5 strength in core and LEs    Baseline R/L  hip abd 4+/5,  , bilat. adductor 4-/5. , bilateral hip ext 5/5    Time 8    Period Weeks    Status On-going    Target Date 10/04/19      PT LONG TERM GOAL #6   Title Pt will be able to take 2 flights of stairs to access home, work multiple floors without excessive fatigue    Baseline can do 1 flight with min fatigue    Status On-going      PT LONG TERM GOAL #7   Title Patient will be able to tolerate change in elevation with exercises (plank to standing) with no more than min difficulty.    Status On-going    Target Date 10/04/19      PT LONG TERM GOAL #8   Title Pt will be able to stand up and walk throughout clinic 300  feet, without BP elevation >10 mm Hg systolic to show improved conditioning    Baseline 30 mm Hg (130 systolic up to 161 with a quick 1.5 min walk (300 feet)    Status On-going    Target Date 10/04/19                 Plan - 09/20/19 1710    Clinical Impression Statement [place]noor is working on tolerance for standing with exercises and height changes/transitions with both resistance from bands, dumbbells as well as body weight.  She has difficulty balancing with tandem and SLS activities.  She has improved but still decreased joint proprioception, leans posterior into a swayback with fatigue.BP monitored thorughout session as well as RPE, increased today but recovers quickly with sit or supine.    PT Treatment/Interventions ADLs/Self Care Home Management;Therapeutic activities;Patient/family education;Therapeutic exercise;Other (comment)           Patient will benefit from skilled therapeutic intervention in order to improve the following deficits and impairments:  Postural dysfunction, Decreased activity tolerance, Cardiopulmonary status limiting activity, Decreased mobility, Dizziness  Visit Diagnosis: Dizziness and giddiness  Hypermobility syndrome  POTS (postural orthostatic tachycardia syndrome)     Problem List Patient Active Problem List   Diagnosis Date Noted  . Lymphadenopathy 02/10/2011    Amamda Curbow 09/20/2019, 5:49 PM  [facility] Outpatient Rehabilitation [facility] [address] [place], [place], [address] Phone: [phone]   Fax:  [phone]  Name: CHARLINE HOSKINSON MRN: [id number] Date of Birth: 05-13-1990  Karie Mainland, PT 09/20/19 5:49 PM Phone: [phone] Fax: [phone]

## 2019-10-04 ENCOUNTER — Encounter: Payer: Self-pay | Admitting: Physical Therapy

## 2019-10-04 ENCOUNTER — Ambulatory Visit: Payer: Managed Care, Other (non HMO) | Admitting: Physical Therapy

## 2019-10-04 ENCOUNTER — Other Ambulatory Visit: Payer: Self-pay

## 2019-10-04 VITALS — BP 129/95 | HR 70

## 2019-10-04 DIAGNOSIS — H6122 Impacted cerumen, left ear: Secondary | ICD-10-CM | POA: Insufficient documentation

## 2019-10-04 DIAGNOSIS — R42 Dizziness and giddiness: Secondary | ICD-10-CM

## 2019-10-04 DIAGNOSIS — I498 Other specified cardiac arrhythmias: Secondary | ICD-10-CM

## 2019-10-04 DIAGNOSIS — G90A Postural orthostatic tachycardia syndrome (POTS): Secondary | ICD-10-CM

## 2019-10-04 DIAGNOSIS — M357 Hypermobility syndrome: Secondary | ICD-10-CM

## 2019-10-04 NOTE — Therapy (Signed)
[facility] Outpatient Rehabilitation [facility] [address] [place], [place], [address] Phone: [phone]   Fax:  [phone]  Physical Therapy Treatment  Patient Details  Name: Roberta [name] MRN: [id number] Date of Birth: [birth year] Referring Provider (PT): Dr. Merri Brunette , Dr. Christie Beckers [facility])    Encounter Date: 10/04/2019   PT End of Session - 10/04/19 1434    Visit Number 44    Number of Visits 52    Date for PT Re-Evaluation 11/29/19    PT Start Time 1400    PT Stop Time 1445    PT Time Calculation (min) 45 min    Activity Tolerance Patient tolerated treatment well    Behavior During Therapy [facility] for tasks assessed/performed           History reviewed. No pertinent past medical history.  History reviewed. No pertinent surgical history.  Vitals:   10/04/19 1408 10/04/19 1445  BP: 129/70 (!) 129/95  Pulse: 70      Subjective Assessment - 10/04/19 1432    Subjective Medication changes by Cardiologist.  He wants me to work on proprioception.  No pain.    Currently in Pain? No/denies              [facility] PT Assessment - 10/04/19 0001      Assessment   Medical Diagnosis EDS/POTS     Referring Provider (PT) Dr. Merri Brunette , Dr. Christie Beckers [facility])     Prior Therapy ongoing       Precautions   Precautions Other (comment)    Precaution Comments monitor BP, HR       Observation/Other Assessments   Observations Beighton scale 7/9 (0 for lumbar and 0 for L knee)       Sensation   Proprioception Impaired by gross assessment    Proprioception Impaired Details Impaired RUE;Impaired LUE;Impaired RLE;Impaired LLE    Additional Comments patient unaware of sway, neutral spine, joint position (hyperextended elbows, head and neck position) throughout sessions       Sit to Stand   Comments 11 x in 30 sec       Posture/Postural Control   Posture/Postural Control Postural limitations    Postural Limitations Rounded Shoulders;Forward  head;Increased lumbar lordosis    Posture Comments sway back       Strength   Right Hand Grip (lbs) 34    Left Hand Grip (lbs) 32    Right Hip Flexion 4+/5    Right Hip ABduction 4+/5    Right Hip ADduction 4/5    Left Hip Flexion 4+/5    Left Hip Extension 5/5    Left Hip ABduction 4+/5    Left Hip ADduction 4/5    Right Knee Flexion 5/5    Right Knee Extension 5/5    Left Knee Flexion 5/5    Left Knee Extension 5/5              [facility] Adult PT Treatment/Exercise - 10/04/19 0001      Lumbar Exercises: Aerobic   Tread Mill 2.6 mph for 6 min end of session, cues to maintain forward gaze for balance and safety       Lumbar Exercises: Standing   Other Standing Lumbar Exercises Suitcase carry 10 lb KB 500 feet x each arm.  Cues for shrugging and maintaining lats engaged     Other Standing Lumbar Exercises tall kneeling: post tilt x 10 , hinge with 5 lbs wgt,  chop and antirotation diagonals x 10-15  each       Knee/Hip Exercises: Standing   Functional Squat 2 sets;15 reps    Functional Squat Limitations 10 lbs KB     Other Standing Knee Exercises deadlift 10 lbs 2 x 10 cues                   PT Education - 10/04/19 2030    Education Details plan of care , ongoing proprioception    Person(s) Educated Patient    Methods Explanation    Comprehension Verbalized understanding            PT Short Term Goals - 02/22/19 1235      PT SHORT TERM GOAL #1   Title Pt will be consistent and I with HEP for core, large muscle group strengthening    Status Achieved      PT SHORT TERM GOAL #2   Title Pt will use RPE and HR monitor (Fitbit) to monitor self while doing cardio    Status Achieved      PT SHORT TERM GOAL #3   Title Pt will be able to report feeling less fatigue post work, able to do simple core work after a few hours of rest.    Status Achieved             PT Long Term Goals - 10/04/19 1440      PT LONG TERM GOAL #1   Title Pt will be able to show  Independence with HEP and consistent cardio routine    Baseline does HEP 4 x per week, cardio 1 x week on days off    Time 8    Period Weeks    Status On-going    Target Date 11/29/19      PT LONG TERM GOAL #2   Title Patient will be able to squat, lift up to 25 lbs x 10 with no pain, just min fatigue    Status Achieved      PT LONG TERM GOAL #3   Title Pt will be able to see 2 patients at work consecutively without need to rest, document.    Status Achieved      PT LONG TERM GOAL #4   Title Pt will be able to complete 30 min of cardio without undue fatigue    Status On-going      PT LONG TERM GOAL #5   Title Pt will be able to show 5/5 strength in core and LEs    Baseline R/L  hip abd 4+/5,  , bilat. adductor 4/5. , bilateral hip ext 5/5    Time 8    Period Weeks    Status On-going      Additional Long Term Goals   Additional Long Term Goals Yes      PT LONG TERM GOAL #6   Title Pt will be able to take 2 flights of stairs to access home, work multiple floors without excessive fatigue    Baseline can do 1 flight with much  fatigue    Status On-going      PT LONG TERM GOAL #7   Title Patient will be able to tolerate change in elevation with exercises (plank to standing) with no more than min difficulty.    Baseline less dizzy but still an issue    Status On-going      PT LONG TERM GOAL #8   Title Pt will be able to stand up and walk throughout clinic 300 feet, without  BP elevation >10 mm Hg systolic to show improved conditioning    Baseline not done today    Status On-going      PT LONG TERM GOAL  #9   TITLE Pt will be abel to maintain a static posture with a procedure at work and show less sway as told to her by co-workers and improved proprioception.    Time 8    Period Weeks    Status New    Target Date 11/29/19      PT LONG TERM GOAL  #10   TITLE Pt will be able to stand on each leg in SLS without trunk compensations for min dynamic activity and no LOB    Time  8    Period Weeks    Status New    Target Date 11/29/19                 Plan - 10/04/19 2034    Clinical Impression Statement Roberta [name] continues to benefit from PT to address cardiovascular resilence, core strength and joint proprioception.  Renewed for 8 weeks as her medications are being adjusted.    PT Frequency 1x / week    PT Duration 8 weeks    PT Treatment/Interventions ADLs/Self Care Home Management;Therapeutic activities;Patient/family education;Therapeutic exercise;Other (comment)    PT Next Visit Plan Dynamic Gait Index.  SLR for VMO.  pilates, lifting, squatting, cardio prior, standing, core. INTERVALS, EMOM with step ups. Monitor BP throughout    PT Home Exercise Plan 4 way SLR, calf raise, bridge, supine knee lift (core), bridge with clam, march, bird dog. Supine 90/90 extension and dead bug for core. Tall kneeling (hinge, shoulder flex/ext) and seated ball/core, seated core strengthening. corner balance and tall kneeling    Consulted and Agree with Plan of Care Patient           Patient will benefit from skilled therapeutic intervention in order to improve the following deficits and impairments:  Postural dysfunction, Decreased activity tolerance, Cardiopulmonary status limiting activity, Decreased mobility, Dizziness  Visit Diagnosis: Dizziness and giddiness  Hypermobility syndrome  POTS (postural orthostatic tachycardia syndrome)     Problem List Patient Active Problem List   Diagnosis Date Noted  . Lymphadenopathy 02/10/2011    Roberta [name] 10/04/2019, 8:46 PM  [facility] Outpatient Rehabilitation [facility] [address] [place], [place], [address] Phone: [phone]   Fax:  [phone]  Name: Roberta [name] MRN: [id number] Date of Birth: [birth year]  Karie Mainland, PT 10/04/19 8:46 PM Phone: [phone] Fax: [phone]

## 2019-10-25 ENCOUNTER — Other Ambulatory Visit: Payer: Self-pay

## 2019-10-25 ENCOUNTER — Ambulatory Visit: Payer: Managed Care, Other (non HMO) | Admitting: Physical Therapy

## 2019-10-25 ENCOUNTER — Encounter: Payer: Self-pay | Admitting: Physical Therapy

## 2019-10-25 ENCOUNTER — Ambulatory Visit: Payer: Managed Care, Other (non HMO) | Attending: Internal Medicine | Admitting: Physical Therapy

## 2019-10-25 VITALS — BP 132/57

## 2019-10-25 DIAGNOSIS — M357 Hypermobility syndrome: Secondary | ICD-10-CM | POA: Diagnosis present

## 2019-10-25 DIAGNOSIS — R42 Dizziness and giddiness: Secondary | ICD-10-CM

## 2019-10-25 DIAGNOSIS — I498 Other specified cardiac arrhythmias: Secondary | ICD-10-CM | POA: Diagnosis present

## 2019-10-25 DIAGNOSIS — G90A Postural orthostatic tachycardia syndrome (POTS): Secondary | ICD-10-CM

## 2019-10-25 NOTE — Therapy (Signed)
[facility] Outpatient Rehabilitation [facility] [address] [place], [place], [address] Phone: [phone]   Fax:  [phone]  Physical Therapy Treatment  Patient Details  Name: Roberta [name] MRN: [id number] Date of Birth: 12/09/1990 Referring Provider (PT): Dr. Merri Brunette , Dr. Christie Beckers [facility])    Encounter Date: 10/25/2019   PT End of Session - 10/25/19 1322    Visit Number 45    Number of Visits 52    Date for PT Re-Evaluation 11/29/19    PT Start Time 1315    PT Stop Time 1400    PT Time Calculation (min) 45 min    Activity Tolerance Patient tolerated treatment well    Behavior During Therapy [facility] for tasks assessed/performed           History reviewed. No pertinent past medical history.  History reviewed. No pertinent surgical history.  Vitals:   10/25/19 1325 10/25/19 1401  BP: (!) 128/99 (!) 132/57     Subjective Assessment - 10/25/19 1320    Subjective BP is better.  No pain . I had an upper endoscopy and they found a quarter sized mass.  Waiting for pathology.    Currently in Pain? No/denies            [facility] Adult PT Treatment/Exercise - 10/25/19 0001      Neuro Re-ed    Neuro Re-ed Details  Rebounder exercises: stand on foam narrow and tandem, catch /toss.  SLS with green ball, stand on floor..       Lumbar Exercises: Standing   Row Limitations Freemotion: 3 plates , 2 sets, double then allternating     Other Standing Lumbar Exercises Push and pull 2 plates freemotion    each side      Lumbar Exercises: Supine   Bridge with [employer] 10 reps    Bridge with [employer] Limitations 3 sets cues to avoid hyperext       Knee/Hip Exercises: Aerobic   Elliptical 6 min L 10 ramp, L. 4 resistance       Shoulder Exercises: Supine   Other Supine Exercises chest press 5 lbs each UE x 15, skull crusher 5 lbs x 10       Shoulder Exercises: ROM/Strengthening   Cybex Row Limitations 1/2 quadruped row x8 lbs x 15                    PT Education - 10/25/19 1508    Education Details emotional support for depression and anxiety related to condition    Person(s) Educated Patient    Methods Explanation    Comprehension Verbalized understanding            PT Short Term Goals - 02/22/19 1235      PT SHORT TERM GOAL #1   Title Pt will be consistent and I with HEP for core, large muscle group strengthening    Status Achieved      PT SHORT TERM GOAL #2   Title Pt will use RPE and HR monitor (Fitbit) to monitor self while doing cardio    Status Achieved      PT SHORT TERM GOAL #3   Title Pt will be able to report feeling less fatigue post work, able to do simple core work after a few hours of rest.    Status Achieved             PT Long Term Goals - 10/04/19 1440  PT LONG TERM GOAL #1   Title Pt will be able to show Independence with HEP and consistent cardio routine    Baseline does HEP 4 x per week, cardio 1 x week on days off    Time 8    Period Weeks    Status On-going    Target Date 11/29/19      PT LONG TERM GOAL #2   Title Patient will be able to squat, lift up to 25 lbs x 10 with no pain, just min fatigue    Status Achieved      PT LONG TERM GOAL #3   Title Pt will be able to see 2 patients at work consecutively without need to rest, document.    Status Achieved      PT LONG TERM GOAL #4   Title Pt will be able to complete 30 min of cardio without undue fatigue    Status On-going      PT LONG TERM GOAL #5   Title Pt will be able to show 5/5 strength in core and LEs    Baseline R/L  hip abd 4+/5,  , bilat. adductor 4/5. , bilateral hip ext 5/5    Time 8    Period Weeks    Status On-going      Additional Long Term Goals   Additional Long Term Goals Yes      PT LONG TERM GOAL #6   Title Pt will be able to take 2 flights of stairs to access home, work multiple floors without excessive fatigue    Baseline can do 1 flight with much  fatigue    Status On-going       PT LONG TERM GOAL #7   Title Patient will be able to tolerate change in elevation with exercises (plank to standing) with no more than min difficulty.    Baseline less dizzy but still an issue    Status On-going      PT LONG TERM GOAL #8   Title Pt will be able to stand up and walk throughout clinic 300 feet, without BP elevation >10 mm Hg systolic to show improved conditioning    Baseline not done today    Status On-going      PT LONG TERM GOAL  #9   TITLE Pt will be abel to maintain a static posture with a procedure at work and show less sway as told to her by co-workers and improved proprioception.    Time 8    Period Weeks    Status New    Target Date 11/29/19      PT LONG TERM GOAL  #10   TITLE Pt will be able to stand on each leg in SLS without trunk compensations for min dynamic activity and no LOB    Time 8    Period Weeks    Status New    Target Date 11/29/19                 Plan - 10/25/19 1508    Clinical Impression Statement BP better controlled, DBP 99 post elliptical but did reduce to 57 with continued exercise for strength, supine. Cont to  need verbal and tactile cues for stability in hips, coordination and shoulder position. Awating path results from endoscopy.    PT Treatment/Interventions ADLs/Self Care Home Management;Therapeutic activities;Patient/family education;Therapeutic exercise;Other (comment)    PT Next Visit Plan Dynamic Gait Index.  SLR for VMO.  pilates, lifting, squatting, cardio prior, standing,  core.Elevation change, (mod. burpee, use wall or mirror for feeback)  INTERVALS, EMOM with step ups. Monitor BP throughout    PT Home Exercise Plan 4 way SLR, calf raise, bridge, supine knee lift (core), bridge with clam, march, bird dog. Supine 90/90 extension and dead bug for core. Tall kneeling (hinge, shoulder flex/ext) and seated ball/core, seated core strengthening. corner balance and tall kneeling    Consulted and Agree with Plan of Care Patient            Patient will benefit from skilled therapeutic intervention in order to improve the following deficits and impairments:  Postural dysfunction, Decreased activity tolerance, Cardiopulmonary status limiting activity, Decreased mobility, Dizziness  Visit Diagnosis: Dizziness and giddiness  Hypermobility syndrome  POTS (postural orthostatic tachycardia syndrome)     Problem List Patient Active Problem List   Diagnosis Date Noted  . Lymphadenopathy 02/10/2011    Alyanah Elliott 10/25/2019, 3:12 PM  [facility] [address] [place], [place], [address] Phone: [phone]   Fax:  [phone]  Name: Roberta [name] MRN: [id number] Date of Birth: Aug 15, 1990  Karie Mainland, PT 10/25/19 3:13 PM Phone: [phone] Fax: [phone]

## 2019-11-01 ENCOUNTER — Ambulatory Visit: Payer: Managed Care, Other (non HMO) | Admitting: Physical Therapy

## 2019-11-01 ENCOUNTER — Encounter: Payer: Managed Care, Other (non HMO) | Admitting: Physical Therapy

## 2019-11-01 ENCOUNTER — Other Ambulatory Visit: Payer: Self-pay

## 2019-11-01 ENCOUNTER — Encounter: Payer: Self-pay | Admitting: Physical Therapy

## 2019-11-01 DIAGNOSIS — M357 Hypermobility syndrome: Secondary | ICD-10-CM

## 2019-11-01 DIAGNOSIS — G90A Postural orthostatic tachycardia syndrome (POTS): Secondary | ICD-10-CM

## 2019-11-01 DIAGNOSIS — R42 Dizziness and giddiness: Secondary | ICD-10-CM | POA: Diagnosis not present

## 2019-11-01 DIAGNOSIS — I498 Other specified cardiac arrhythmias: Secondary | ICD-10-CM

## 2019-11-01 NOTE — Therapy (Signed)
[facility] Outpatient Rehabilitation [facility] [address] [place], [place], [address] Phone: [phone]   Fax:  [phone]  Physical Therapy Treatment  Patient Details  Name: Roberta [name] MRN: [id number] Date of Birth: Aug 11, 1990 Referring Provider (PT): Dr. Merri Brunette , Dr. Christie Beckers [facility])    Encounter Date: 11/01/2019   PT End of Session - 11/01/19 1321    Visit Number 46    Number of Visits 52    Date for PT Re-Evaluation 11/29/19    PT Start Time 1315    PT Stop Time 1358    PT Time Calculation (min) 43 min    Activity Tolerance Patient tolerated treatment well    Behavior During Therapy [facility] for tasks assessed/performed           History reviewed. No pertinent past medical history.  History reviewed. No pertinent surgical history.  There were no vitals filed for this visit.   Subjective Assessment - 11/01/19 1317    Subjective I had blood work done but no one has called me.  [place] dis[facility]ility [facility].  Everything was off.  No results from biopsy.  Feels hot, dizzy, nauseous today.  No pain.    Currently in Pain? No/denies              Pilates Tower for LE/Core strength, postural strength, lumbopelvic disassociation and core control.  Exercises included:  Supine Leg [place] yellow springs   Single leg arcs x 10, then double leg scissor x 10 each   Single leg circles x 10 each leg , each direction  Arm [place] slastix   Legs in table top Arm Arcs  X 15   Hooklying arcs with ball squeeze x 15   Arm arcs with bridge, ball  Squeeze x 10   Sidelying legs hip add/abd x 10, circles x 10 each direction for glute med and core   Roll down x 10  Chest expansion/thigh stretch x 10 , cues for sway back and neutral spine       PT Short Term Goals - 02/22/19 1235      PT SHORT TERM GOAL #1   Title Pt will be consistent and I with HEP for core, large muscle group strengthening    Status Achieved      PT SHORT TERM GOAL #2   Title  Pt will use RPE and HR monitor (Fitbit) to monitor self while doing cardio    Status Achieved      PT SHORT TERM GOAL #3   Title Pt will be able to report feeling less fatigue post work, able to do simple core work after a few hours of rest.    Status Achieved             PT Long Term Goals - 10/04/19 1440      PT LONG TERM GOAL #1   Title Pt will be able to show Independence with HEP and consistent cardio routine    Baseline does HEP 4 x per week, cardio 1 x week on days off    Time 8    Period Weeks    Status On-going    Target Date 11/29/19      PT LONG TERM GOAL #2   Title Patient will be able to squat, lift up to 25 lbs x 10 with no pain, just min fatigue    Status Achieved      PT LONG TERM GOAL #3   Title Pt will be  able to see 2 patients at work consecutively without need to rest, document.    Status Achieved      PT LONG TERM GOAL #4   Title Pt will be able to complete 30 min of cardio without undue fatigue    Status On-going      PT LONG TERM GOAL #5   Title Pt will be able to show 5/5 strength in core and LEs    Baseline R/L  hip abd 4+/5,  , bilat. adductor 4/5. , bilateral hip ext 5/5    Time 8    Period Weeks    Status On-going      Additional Long Term Goals   Additional Long Term Goals Yes      PT LONG TERM GOAL #6   Title Pt will be able to take 2 flights of stairs to access home, work multiple floors without excessive fatigue    Baseline can do 1 flight with much  fatigue    Status On-going      PT LONG TERM GOAL #7   Title Patient will be able to tolerate change in elevation with exercises (plank to standing) with no more than min difficulty.    Baseline less dizzy but still an issue    Status On-going      PT LONG TERM GOAL #8   Title Pt will be able to stand up and walk throughout clinic 300 feet, without BP elevation >10 mm Hg systolic to show improved conditioning    Baseline not done today    Status On-going      PT LONG TERM GOAL  #9    TITLE Pt will be abel to maintain a static posture with a procedure at work and show less sway as told to her by co-workers and improved proprioception.    Time 8    Period Weeks    Status New    Target Date 11/29/19      PT LONG TERM GOAL  #10   TITLE Pt will be able to stand on each leg in SLS without trunk compensations for min dynamic activity and no LOB    Time 8    Period Weeks    Status New    Target Date 11/29/19                 Plan - 11/01/19 1321    Clinical Impression Statement Patient not feeling well today BP was abnormal (166/40, 155/35 then up to 144/90 with supine legs elevated).  HR at rest 79.  She continues to benefit from monitored exercise to manage condition.  Supine exercise for majority of session to control BP.  She felt good as she left, encouraged her to sip water, BP stable.    PT Treatment/Interventions ADLs/Self Care Home Management;Therapeutic activities;Patient/family education;Therapeutic exercise;Other (comment)    PT Next Visit Plan Dynamic Gait Index.  SLR for VMO.  pilates, lifting, squatting, cardio prior, standing, core.Elevation change, (mod. burpee, use wall or mirror for feeback)  INTERVALS, EMOM with step ups. Monitor BP throughout    PT Home Exercise Plan 4 way SLR, calf raise, bridge, supine knee lift (core), bridge with clam, march, bird dog. Supine 90/90 extension and dead bug for core. Tall kneeling (hinge, shoulder flex/ext) and seated ball/core, seated core strengthening. corner balance and tall kneeling    Consulted and Agree with Plan of Care Patient           Patient will benefit from skilled therapeutic  intervention in order to improve the following deficits and impairments:  Postural dysfunction, Decreased activity tolerance, Cardiopulmonary status limiting activity, Decreased mobility, Dizziness  Visit Diagnosis: Dizziness and giddiness  Hypermobility syndrome  POTS (postural orthostatic tachycardia  syndrome)     Problem List Patient Active Problem List   Diagnosis Date Noted  . Lymphadenopathy 02/10/2011    Stephanos Fan 11/01/2019, 2:13 PM  [facility] [address] [place], [place], [address] Phone: [phone]   Fax:  [phone]  Name: Roberta [name] MRN: [id number] Date of Birth: [birth year]  Karie Mainland, PT 11/01/19 2:13 PM Phone: [phone] Fax: [phone]

## 2019-11-08 ENCOUNTER — Other Ambulatory Visit: Payer: Self-pay

## 2019-11-08 ENCOUNTER — Ambulatory Visit: Payer: Managed Care, Other (non HMO) | Admitting: Physical Therapy

## 2019-11-08 VITALS — BP 127/69 | HR 76

## 2019-11-08 DIAGNOSIS — M357 Hypermobility syndrome: Secondary | ICD-10-CM

## 2019-11-08 DIAGNOSIS — R42 Dizziness and giddiness: Secondary | ICD-10-CM | POA: Diagnosis not present

## 2019-11-08 DIAGNOSIS — I498 Other specified cardiac arrhythmias: Secondary | ICD-10-CM

## 2019-11-08 DIAGNOSIS — G90A Postural orthostatic tachycardia syndrome (POTS): Secondary | ICD-10-CM

## 2019-11-08 NOTE — Therapy (Signed)
[facility] Outpatient Rehabilitation [facility] [address] [place], [place], [address] Phone: [phone]   Fax:  [phone]  Physical Therapy Treatment  Patient Details  Name: Roberta [name] MRN: [id number] Date of Birth: [birth year] Referring Provider (PT): Dr. Merri Brunette , Dr. Christie Beckers [facility])    Encounter Date: 11/08/2019   PT End of Session - 11/08/19 1323    Visit Number 47    Number of Visits 52    Date for PT Re-Evaluation 11/29/19    PT Start Time 1317    PT Stop Time 1400    PT Time Calculation (min) 43 min    Activity Tolerance Patient tolerated treatment well    Behavior During Therapy [facility] for tasks assessed/performed           No past medical history on file.  No past surgical history on file.  Vitals:   11/08/19 1320 11/08/19 1345  BP: 131/69 127/69  Pulse: 76   SpO2: 99%      Subjective Assessment - 11/08/19 1321    Subjective My standing tolerance is really bad. I can fix my sway back now. Gets short of breath ,legs tired even after 10 min.               [facility] Adult PT Treatment/Exercise - 11/08/19 0001      Lumbar Exercises: Machines for Strengthening   Cybex Knee Extension 15lbs 2 x 20 reps     Other Lumbar Machine Exercise row 20 lbs x 20 reps 2 sets       Lumbar Exercises: Standing   Functional Squats 10 reps    Functional Squats Limitations 4 sec up and 4 sec down , 10 lbs     Forward Lunge 10 reps    Forward Lunge Limitations 10 lbs alt.     Wall Slides Limitations 30 sec hold 1/3 squat x 3       Knee/Hip Exercises: Stretches   Active Hamstring Stretch Both;2 reps;30 seconds    ITB Stretch Both;2 reps;30 seconds      Knee/Hip Exercises: Supine   Bridges Limitations legs on ball , 10 sec hold x 10 , added UE chest press/hold 5 lbs     Other Supine Knee/Hip Exercises bridge hold with overhead arm lift  5lbs       Shoulder Exercises: ROM/Strengthening   Wall Pushups 10 reps    Wall Pushups Limitations 5 sec  hold     Other ROM/Strengthening Exercises wall slides x 10 with 8 lbs weight push                   PT Education - 11/08/19 1724    Education Details headspace app for mental health    Person(s) Educated Patient    Methods Explanation;Demonstration    Comprehension Verbalized understanding;Returned demonstration            PT Short Term Goals - 02/22/19 1235      PT SHORT TERM GOAL #1   Title Pt will be consistent and I with HEP for core, large muscle group strengthening    Status Achieved      PT SHORT TERM GOAL #2   Title Pt will use RPE and HR monitor (Fitbit) to monitor self while doing cardio    Status Achieved      PT SHORT TERM GOAL #3   Title Pt will be able to report feeling less fatigue post work, able to do simple core work after a  few hours of rest.    Status Achieved             PT Long Term Goals - 11/08/19 1725      PT LONG TERM GOAL #1   Title Pt will be able to show Independence with HEP and consistent cardio routine    Status On-going      PT LONG TERM GOAL #2   Title Patient will be able to squat, lift up to 25 lbs x 10 with no pain, just min fatigue    Status Achieved      PT LONG TERM GOAL #3   Title Pt will be able to see 2 patients at work consecutively without need to rest, document.    Status Achieved      PT LONG TERM GOAL #4   Title Pt will be able to complete 30 min of cardio without undue fatigue    Status On-going      PT LONG TERM GOAL #5   Title Pt will be able to show 5/5 strength in core and LEs    Baseline R/L  hip abd 4+/5,  , bilat. adductor 4/5. , bilateral hip ext 5/5, poor endurance    Status On-going      PT LONG TERM GOAL #6   Title Pt will be able to take 2 flights of stairs to access home, work multiple floors without excessive fatigue    Baseline can do 1 flight with mod fatigue    Status On-going      PT LONG TERM GOAL #7   Title Patient will be able to tolerate change in elevation with exercises  (plank to standing) with no more than min difficulty.    Baseline has not done lately , fatigued    Status On-going      PT LONG TERM GOAL #8   Title Pt will be able to stand up and walk throughout clinic 300 feet, without BP elevation >10 mm Hg systolic to show improved conditioning    Status On-going      PT LONG TERM GOAL  #9   TITLE Pt will be abel to maintain a static posture with a procedure at work and show less sway as told to her by co-workers and improved proprioception.    Status On-going      PT LONG TERM GOAL  #10   TITLE Pt will be able to stand on each leg in SLS without trunk compensations for min dynamic activity and no LOB    Status On-going                 Plan - 11/08/19 1727    Clinical Impression Statement BP response good today.  Worked on muscle endurance , increaesed hold times in order to improve her ability to stand longer periods of time at work.  She has difficulty maintaining a muscle contraction especially in quads with wall sit.    PT Treatment/Interventions ADLs/Self Care Home Management;Therapeutic activities;Patient/family education;Therapeutic exercise;Other (comment)    PT Next Visit Plan Dynamic Gait Index.  SLR for VMO.  pilates, lifting, squatting, cardio prior, standing, core.Elevation change, (mod. burpee, use wall or mirror for feeback)  INTERVALS, EMOM with step ups. Monitor BP throughout    PT Home Exercise Plan 4 way SLR, calf raise, bridge, supine knee lift (core), bridge with clam, march, bird dog. Supine 90/90 extension and dead bug for core. Tall kneeling (hinge, shoulder flex/ext) and seated ball/core, seated core strengthening. corner  balance and tall kneeling    Consulted and Agree with Plan of Care Patient           Patient will benefit from skilled therapeutic intervention in order to improve the following deficits and impairments:  Postural dysfunction, Decreased activity tolerance, Cardiopulmonary status limiting activity,  Decreased mobility, Dizziness  Visit Diagnosis: Dizziness and giddiness  Hypermobility syndrome  POTS (postural orthostatic tachycardia syndrome)     Problem List Patient Active Problem List   Diagnosis Date Noted  . Lymphadenopathy 02/10/2011    Nickalos Petersen 11/08/2019, 5:32 PM  [facility] Outpatient Rehabilitation [facility] [address] [place], [place], [address] Phone: [phone]   Fax:  [phone]  Name: JAKERRA FLOYD MRN: [id number] Date of Birth: [birth year]  Karie Mainland, PT 11/08/19 5:34 PM Phone: [phone] Fax: [phone]

## 2019-11-15 ENCOUNTER — Ambulatory Visit: Payer: Managed Care, Other (non HMO) | Admitting: Physical Therapy

## 2019-11-15 ENCOUNTER — Other Ambulatory Visit: Payer: Self-pay

## 2019-11-15 VITALS — BP 136/113

## 2019-11-15 DIAGNOSIS — R42 Dizziness and giddiness: Secondary | ICD-10-CM | POA: Diagnosis not present

## 2019-11-15 DIAGNOSIS — M357 Hypermobility syndrome: Secondary | ICD-10-CM

## 2019-11-15 DIAGNOSIS — G90A Postural orthostatic tachycardia syndrome (POTS): Secondary | ICD-10-CM

## 2019-11-15 DIAGNOSIS — I498 Other specified cardiac arrhythmias: Secondary | ICD-10-CM

## 2019-11-15 NOTE — Therapy (Signed)
[facility] Outpatient Rehabilitation [facility] [address] [place], [place], [address] Phone: [phone]   Fax:  [phone]  Physical Therapy Treatment  Patient Details  Name: Roberta [name] MRN: [id number] Date of Birth: 1990-12-16 Referring Provider (PT): Dr. Merri [name] , Dr. Christie [name] [facility])    Encounter Date: 11/15/2019   PT End of Session - 11/15/19 1325    Visit Number 48    Number of Visits 52    Date for PT Re-Evaluation 11/29/19    PT Start Time 1315    PT Stop Time 1400    PT Time Calculation (min) 45 min    Activity Tolerance Patient tolerated treatment well    Behavior During Therapy [facility] for tasks assessed/performed           No past medical history on file.  No past surgical history on file.  Vitals:   11/15/19 1321 11/15/19 1331 11/15/19 1400  BP: (!) 141/90 (!) 171/88 (!) 136/113     Subjective Assessment - 11/15/19 1323    Subjective They cut my diuretic in half. My bloodwork was bad and they think it was because I was/am dehydrated.    Currently in Pain? No/denies             [facility] Adult PT Treatment/Exercise - 11/15/19 0001      Lumbar Exercises: Aerobic   Recumbent Bike L2 hills RPE 7      Lumbar Exercises: Standing   Wall Slides 10 reps    Wall Slides Limitations then 3 level hold 10 sec isometric       Knee/Hip Exercises: Standing   Abduction Limitations wall slides Rt/Lt. hip abduction/extension holding pole    cues to square hips, done without pole well for balance    Wall Squat Limitations --    Other Standing Knee Exercises 1/2 kneeling punch x 10  each arm, leg.  Facing sideways for Upper trunk rot for oblique core  1-2 plates on [employer]     Other Standing Knee Exercises tall kneeling spine flexion for core (2 plates ) x 10                   PT Education - 11/15/19 2016    Education Details EDS MD in [facility]) Educated Patient    Methods Explanation    Comprehension Verbalized  understanding            PT Short Term Goals - 02/22/19 1235      PT SHORT TERM GOAL #1   Title Pt will be consistent and I with HEP for core, large muscle group strengthening    Status Achieved      PT SHORT TERM GOAL #2   Title Pt will use RPE and HR monitor (Fitbit) to monitor self while doing cardio    Status Achieved      PT SHORT TERM GOAL #3   Title Pt will be able to report feeling less fatigue post work, able to do simple core work after a few hours of rest.    Status Achieved             PT Long Term Goals - 11/08/19 1725      PT LONG TERM GOAL #1   Title Pt will be able to show Independence with HEP and consistent cardio routine    Status On-going      PT LONG TERM GOAL #2   Title Patient will be able to  squat, lift up to 25 lbs x 10 with no pain, just min fatigue    Status Achieved      PT LONG TERM GOAL #3   Title Pt will be able to see 2 patients at work consecutively without need to rest, document.    Status Achieved      PT LONG TERM GOAL #4   Title Pt will be able to complete 30 min of cardio without undue fatigue    Status On-going      PT LONG TERM GOAL #5   Title Pt will be able to show 5/5 strength in core and LEs    Baseline R/L  hip abd 4+/5,  , bilat. adductor 4/5. , bilateral hip ext 5/5, poor endurance    Status On-going      PT LONG TERM GOAL #6   Title Pt will be able to take 2 flights of stairs to access home, work multiple floors without excessive fatigue    Baseline can do 1 flight with mod fatigue    Status On-going      PT LONG TERM GOAL #7   Title Patient will be able to tolerate change in elevation with exercises (plank to standing) with no more than min difficulty.    Baseline has not done lately , fatigued    Status On-going      PT LONG TERM GOAL #8   Title Pt will be able to stand up and walk throughout clinic 300 feet, without BP elevation >10 mm Hg systolic to show improved conditioning    Status On-going      PT  LONG TERM GOAL  #9   TITLE Pt will be abel to maintain a static posture with a procedure at work and show less sway as told to her by co-workers and improved proprioception.    Status On-going      PT LONG TERM GOAL  #10   TITLE Pt will be able to stand on each leg in SLS without trunk compensations for min dynamic activity and no LOB    Status On-going                 Plan - 11/15/19 2016    Clinical Impression Statement Patient with BP in ranges of 135-170 systolic and up to 90-100's  in diastolic.  Left clinic 136/76 after supine . Asymptomatic, generally. I encouraged her to drink more water but she does get nauseous due to motility issues. Has difficulty maintaining pelvic stability in standing due to poor proprioception.  Not safe for her to exercise without BP monitoring. Seeing Dr.  Jordan [name] next week for a baseline EDS consultation.    PT Treatment/Interventions ADLs/Self Care Home Management;Therapeutic activities;Patient/family education;Therapeutic exercise;Other (comment)    PT Next Visit Plan Dynamic Gait Index.  SLR for VMO.  pilates, lifting, squatting, cardio prior, standing, core.Elevation change, (mod. burpee, use wall or mirror for feeback)  INTERVALS, EMOM with step ups. Monitor BP throughout    PT Home Exercise Plan 4 way SLR, calf raise, bridge, supine knee lift (core), bridge with clam, march, bird dog. Supine 90/90 extension and dead bug for core. Tall kneeling (hinge, shoulder flex/ext) and seated ball/core, seated core strengthening. corner balance and tall kneeling    Consulted and Agree with Plan of Care Patient           Patient will benefit from skilled therapeutic intervention in order to improve the following deficits and impairments:  Postural dysfunction, Decreased activity  tolerance, Cardiopulmonary status limiting activity, Decreased mobility, Dizziness  Visit Diagnosis: Dizziness and giddiness  Hypermobility syndrome  POTS (postural orthostatic  tachycardia syndrome)     Problem List Patient Active Problem List   Diagnosis Date Noted  . Lymphadenopathy 02/10/2011    Roberta [name] 11/15/2019, 8:23 PM  [facility] Outpatient Rehabilitation [facility] [address] [place], [place], [address] Phone: [phone]   Fax:  [phone]  Name: Roberta [name] MRN: [id number] Date of Birth: February 24, 1991  Karie Mainland, PT 11/15/19 8:23 PM Phone: [phone] Fax: [phone]

## 2019-11-22 ENCOUNTER — Encounter: Payer: Self-pay | Admitting: Family Medicine

## 2019-11-22 ENCOUNTER — Ambulatory Visit (HOSPITAL_BASED_OUTPATIENT_CLINIC_OR_DEPARTMENT_OTHER)
Admission: RE | Admit: 2019-11-22 | Discharge: 2019-11-22 | Disposition: A | Discharge status: Home / Self Care | Payer: Managed Care, Other (non HMO) | Source: Ambulatory Visit | Attending: Family Medicine | Admitting: Family Medicine

## 2019-11-22 ENCOUNTER — Other Ambulatory Visit: Payer: Self-pay

## 2019-11-22 ENCOUNTER — Ambulatory Visit: Payer: Managed Care, Other (non HMO) | Admitting: Family Medicine

## 2019-11-22 VITALS — BP 144/95 | HR 90 | Ht 65.0 in | Wt 170.0 lb

## 2019-11-22 DIAGNOSIS — Q796 Ehlers-Danlos syndrome, unspecified: Secondary | ICD-10-CM | POA: Insufficient documentation

## 2019-11-22 NOTE — Patient Instructions (Signed)
Nice to meet you You can check out [email or web address]  on twitter  I will call with the results from today   Please send me a message in MyChart with any questions or updates.  Please see me back in 2-3 months or as needed.   --Dr. 

## 2019-11-22 NOTE — Assessment & Plan Note (Signed)
Has a confirmation of Ehlers-Danlos with genetics test.  Has been going through physical therapy.  Denies any pain in her chest currently with the popping that she experiences -Counseled on supportive care. -Chest x-ray.

## 2019-11-22 NOTE — Progress Notes (Signed)
  KASIYA [name] - 29 y.o. female MRN [id number]  Date of birth: 12-Nov-1990  SUBJECTIVE:  Including CC & ROS.  Chief Complaint  Patient presents with  . Joint Pain    Roberta [name] is a 29 y.o. female that is presenting with palpation of her chest.  This occurs when she is turning with no pain.  She has a history of Ehlers-Danlos that was confirmed with dynamic status.  Denies any prior surgeries.  No shortness of breath.  Does have a history of POTS.   Review of Systems See HPI   HISTORY: Past Medical, Surgical, Social, and Family History Reviewed & Updated per EMR.   Pertinent Historical Findings include:  No past medical history on file.  No past surgical history on file.  Family History  Problem Relation Age of Onset  . Lymphoma Paternal Grandfather     Social History   Socioeconomic History  . Marital status: Single    Spouse name: Not on file  . Number of children: Not on file  . Years of education: Not on file  . Highest education level: Not on file  Occupational History  . Not on file  Tobacco Use  . Smoking status: Never Smoker  . Smokeless tobacco: Never Used  Substance and Sexual Activity  . Alcohol use: Not on file  . Drug use: Not on file  . Sexual activity: Not on file  Other Topics Concern  . Not on file  Social History Narrative  . Not on file   Social Determinants of Health   Financial Resource Strain:   . Difficulty of Paying Living Expenses: Not on file  Food Insecurity:   . Worried About Programme researcher, broadcasting/film/video in the Last Year: Not on file  . Ran Out of Food in the Last Year: Not on file  Transportation Needs:   . Lack of Transportation (Medical): Not on file  . Lack of Transportation (Non-Medical): Not on file  Physical Activity:   . Days of Exercise per Week: Not on file  . Minutes of Exercise per Session: Not on file  Stress:   . Feeling of Stress : Not on file  Social Connections:   . Frequency of Communication with Friends and Family:  Not on file  . Frequency of Social Gatherings with Friends and Family: Not on file  . Attends Religious Services: Not on file  . Active Member of Clubs or Organizations: Not on file  . Attends Banker Meetings: Not on file  . Marital Status: Not on file  Intimate Partner Violence:   . Fear of Current or Ex-Partner: Not on file  . Emotionally Abused: Not on file  . Physically Abused: Not on file  . Sexually Abused: Not on file     PHYSICAL EXAM:  VS: BP (!) 144/95   Pulse 90   Ht 5\' 5"  (1.651 m)   Wt 170 lb (77.1 kg)   BMI 28.29 kg/m  Physical Exam Gen: NAD, alert, cooperative with exam, well-appearing MSK:  Can take thumbs to forearm. Right hand hyperextended no effusion. Can hyperextend elbows and knees. Able to place hands flat on floor. Neurovascularly intact     ASSESSMENT & PLAN:   Ehlers-Danlos syndrome Has a confirmation of Ehlers-Danlos with genetics test.  Has been going through physical therapy.  Denies any pain in her chest currently with the popping that she experiences -Counseled on supportive care. -Chest x-ray.

## 2019-11-27 ENCOUNTER — Telehealth: Payer: Self-pay | Admitting: Family Medicine

## 2019-11-27 NOTE — Telephone Encounter (Signed)
Left VM for patient. If she calls back please have her speak with a nurse/CMA and that her chest xray didn't show any abnormalities.   If any questions then please take the best time and phone number to call and I will try to call her back.   Myra Rude, MD [facility] 11/27/2019, 8:23 AM

## 2019-11-28 ENCOUNTER — Telehealth: Payer: Self-pay | Admitting: Family Medicine

## 2019-11-28 NOTE — Telephone Encounter (Signed)
Spoke to patient and gave result information as provided by the physician. 

## 2019-11-28 NOTE — Telephone Encounter (Signed)
Patient called for lab--see Provider notes below  Left VM for patient. If she calls back please have her speak with a nurse/CMA and that her chest xray didn't show any abnormalities.   If any questions then please take the best time and phone number to call and I will try to call her back.   Myra Rude, MD [facility] 11/27/2019, 8:23 AM  --Fausto Skillern

## 2019-11-29 ENCOUNTER — Encounter: Payer: Self-pay | Admitting: Physical Therapy

## 2019-11-29 ENCOUNTER — Ambulatory Visit: Payer: Managed Care, Other (non HMO) | Attending: Internal Medicine | Admitting: Physical Therapy

## 2019-11-29 ENCOUNTER — Other Ambulatory Visit: Payer: Self-pay

## 2019-11-29 VITALS — BP 159/98 | HR 97

## 2019-11-29 DIAGNOSIS — R42 Dizziness and giddiness: Secondary | ICD-10-CM | POA: Diagnosis present

## 2019-11-29 DIAGNOSIS — M357 Hypermobility syndrome: Secondary | ICD-10-CM | POA: Diagnosis present

## 2019-11-29 DIAGNOSIS — I498 Other specified cardiac arrhythmias: Secondary | ICD-10-CM | POA: Diagnosis present

## 2019-11-29 DIAGNOSIS — G90A Postural orthostatic tachycardia syndrome (POTS): Secondary | ICD-10-CM

## 2019-11-29 NOTE — Therapy (Signed)
[facility] [address] [place], [place], [address] Phone: [phone]   Fax:  [phone]  Physical Therapy Treatment  Patient Details  Name: Roberta [name] MRN: [id number] Date of Birth: Jan 25, 1991 Referring Provider (PT): Dr. Merri Brunette , Dr. Christie Beckers [facility])    Encounter Date: 11/29/2019   PT End of Session - 11/29/19 1504    Visit Number 49    Number of Visits 52    Date for PT Re-Evaluation 11/29/19    PT Start Time 1502    PT Stop Time 1544    PT Time Calculation (min) 42 min    Activity Tolerance Patient tolerated treatment well    Behavior During Therapy [facility] for tasks assessed/performed           History reviewed. No pertinent past medical history.  History reviewed. No pertinent surgical history.  Vitals:   11/29/19 1519 11/29/19 1524 11/29/19 1531 11/29/19 1543  BP: (!) 163/98 (!) 151/90 (!) 149/94 (!) 159/98  Pulse:      SpO2:         Subjective Assessment - 11/29/19 1505    Subjective " they changed my diuretic and gave me a new medication. my BP has been all over the place."    Pertinent History POTS, hyperadrenergic type.    Patient Stated Goals Patient would like to have more energy and strength    Currently in Pain? No/denies              [facility] PT Assessment - 11/29/19 0001      Assessment   Medical Diagnosis EDS/POTS     Referring Provider (PT) Dr. Merri Brunette , Dr. Christie Beckers [facility])                          [facility] Adult PT Treatment/Exercise - 11/29/19 0001      Lumbar Exercises: Aerobic   Recumbent Bike L3 x 8 min starting RPE 9, ended10 (started due to elevated systolic)   163/98 following bike     Lumbar Exercises: Supine   Other Supine Lumbar Exercises supine marching laying on foam roll with arms hoovering above table 2 x 15   RPE 13     Knee/Hip Exercises: Seated   Other Seated Knee/Hip Exercises 2 x 10 bil 1/2 kneeling on bosu ball rows using red theraband    RPE 13     Shoulder Exercises: ROM/Strengthening   UBE (Upper Arm Bike) L1 x 4 min (fwd/bwd 2 min ea. at end of session due to lower diastolic BP   following 159/98, RPE 15/20   Other ROM/Strengthening Exercises Rows on machine 2 x 10 bil UE 20# Lat pull down bil UE 2 x 10 20#   RPE 13/20                   PT Short Term Goals - 02/22/19 1235      PT SHORT TERM GOAL #1   Title Pt will be consistent and I with HEP for core, large muscle group strengthening    Status Achieved      PT SHORT TERM GOAL #2   Title Pt will use RPE and HR monitor (Fitbit) to monitor self while doing cardio    Status Achieved      PT SHORT TERM GOAL #3   Title Pt will be able to report feeling less fatigue post work, able to do simple core work after a  few hours of rest.    Status Achieved             PT Long Term Goals - 11/08/19 1725      PT LONG TERM GOAL #1   Title Pt will be able to show Independence with HEP and consistent cardio routine    Status On-going      PT LONG TERM GOAL #2   Title Patient will be able to squat, lift up to 25 lbs x 10 with no pain, just min fatigue    Status Achieved      PT LONG TERM GOAL #3   Title Pt will be able to see 2 patients at work consecutively without need to rest, document.    Status Achieved      PT LONG TERM GOAL #4   Title Pt will be able to complete 30 min of cardio without undue fatigue    Status On-going      PT LONG TERM GOAL #5   Title Pt will be able to show 5/5 strength in core and LEs    Baseline R/L  hip abd 4+/5,  , bilat. adductor 4/5. , bilateral hip ext 5/5, poor endurance    Status On-going      PT LONG TERM GOAL #6   Title Pt will be able to take 2 flights of stairs to access home, work multiple floors without excessive fatigue    Baseline can do 1 flight with mod fatigue    Status On-going      PT LONG TERM GOAL #7   Title Patient will be able to tolerate change in elevation with exercises (plank to standing) with  no more than min difficulty.    Baseline has not done lately , fatigued    Status On-going      PT LONG TERM GOAL #8   Title Pt will be able to stand up and walk throughout clinic 300 feet, without BP elevation >10 mm Hg systolic to show improved conditioning    Status On-going      PT LONG TERM GOAL  #9   TITLE Pt will be abel to maintain a static posture with a procedure at work and show less sway as told to her by co-workers and improved proprioception.    Status On-going      PT LONG TERM GOAL  #10   TITLE Pt will be able to stand on each leg in SLS without trunk compensations for min dynamic activity and no LOB    Status On-going                 Plan - 11/29/19 1610    Clinical Impression Statement pt reported starting RPEat  9/20 and initall BP 148/101. systolic BP stayed between 148 and 163 and diastolic between 40-981 Started with recumbent bike due to elevated diastolic over 100, she was able to perform supine core strengthening progressing to machine rows/ lat pull downs and 1/2 kneeling uniltateral strengtheing. progress to the arm bike due to her diastolic BP staying in the 90's for the remainder of the session, RPE increased to  15/20 which occurred following arm bike which she reported feeling slightly dizzy after which BP was assessed at 159/98. dizziness resolved quickly once sitting/ resting for ~2 min and pt report no other symptoms at end of session.    Examination-Activity Limitations Stand;Locomotion Level;Other    PT Treatment/Interventions ADLs/Self Care Home Management;Therapeutic activities;Patient/family education;Therapeutic exercise;Other (comment)    PT  Next Visit Plan ERO, Dynamic Gait Index.  SLR for VMO.  pilates, lifting, squatting, cardio prior, standing, core.Elevation change, (mod. burpee, use wall or mirror for feeback)  INTERVALS, EMOM with step ups. Monitor BP throughout    PT Home Exercise Plan 4 way SLR, calf raise, bridge, supine knee lift  (core), bridge with clam, march, bird dog. Supine 90/90 extension and dead bug for core. Tall kneeling (hinge, shoulder flex/ext) and seated ball/core, seated core strengthening. corner balance and tall kneeling           Patient will benefit from skilled therapeutic intervention in order to improve the following deficits and impairments:  Postural dysfunction, Decreased activity tolerance, Cardiopulmonary status limiting activity, Decreased mobility, Dizziness  Visit Diagnosis: Dizziness and giddiness  Hypermobility syndrome  POTS (postural orthostatic tachycardia syndrome)     Problem List Patient Active Problem List   Diagnosis Date Noted  . Ehlers-Danlos syndrome 11/22/2019  . Lymphadenopathy 02/10/2011   Lulu Riding PT, DPT, LAT, ATC  11/29/19  4:33 PM      [facility] [address] [place], [place], [address] Phone: [phone]   Fax:  [phone]  Name: COLLEEN KOTLARZ MRN: [id number] Date of Birth: 1990/10/21

## 2019-12-13 ENCOUNTER — Encounter: Payer: Self-pay | Admitting: Physical Therapy

## 2019-12-13 ENCOUNTER — Ambulatory Visit: Payer: Managed Care, Other (non HMO) | Admitting: Physical Therapy

## 2019-12-13 ENCOUNTER — Other Ambulatory Visit: Payer: Self-pay

## 2019-12-13 VITALS — BP 135/83

## 2019-12-13 DIAGNOSIS — I498 Other specified cardiac arrhythmias: Secondary | ICD-10-CM

## 2019-12-13 DIAGNOSIS — M357 Hypermobility syndrome: Secondary | ICD-10-CM

## 2019-12-13 DIAGNOSIS — R42 Dizziness and giddiness: Secondary | ICD-10-CM

## 2019-12-13 DIAGNOSIS — G90A Postural orthostatic tachycardia syndrome (POTS): Secondary | ICD-10-CM

## 2019-12-13 NOTE — Therapy (Signed)
[place], [place], [address] Phone: [phone]   Fax:  [phone]  Physical Therapy Treatment  Patient Details  Name: Roberta [name] MRN: [id number] Date of Birth: 06/06/1990 Referring Provider (PT): Dr. Deland Pretty , Dr. Margarito Courser [facility])    Encounter Date: 12/13/2019   PT End of Session - 12/13/19 0751    Visit Number 50    Number of Visits 58    Date for PT Re-Evaluation 01/31/20    PT Start Time 0745    PT Stop Time 0829    PT Time Calculation (min) 44 min    Activity Tolerance Patient tolerated treatment well    Behavior During Therapy [facility] for tasks assessed/performed           History reviewed. No pertinent past medical history.  History reviewed. No pertinent surgical history.  There were no vitals filed for this visit.   Subjective Assessment - 12/13/19 1154    Subjective my meds are all different.  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  Does this 2 days per week.    Currently in Pain? No/denies              [facility] PT Assessment - 12/13/19 0001      Assessment   Medical Diagnosis EDS/POTS     Referring Provider (PT) Dr. Deland Pretty , Dr. Margarito Courser [facility])       Strength   Right Hip Flexion 4/5    Right Hip Extension 4+/5    Right Hip ABduction 4+/5    Right Hip ADduction 4/5    Left Hip Flexion 4/5    Left Hip Extension 4+/5    Left Hip ABduction 4+/5    Left Hip ADduction 4/5    Right Knee Flexion 5/5    Right Knee Extension 5/5    Left Knee Flexion 5/5    Left Knee Extension 5/5              [facility] Adult PT Treatment/Exercise - 12/13/19 0001      Lumbar Exercises: Supine   Bridge 10 reps    Bridge Limitations 10 sec hold with ball     Single Leg Bridge 5 reps    Bridge with [employer] Limitations figure 4, small ROM     Other Supine Lumbar Exercises isometric hip abd x 30 sec x 3 with Pilates circle       Lumbar Exercises:  Quadruped   Plank modified       Knee/Hip Exercises: Aerobic   Elliptical 6 min L 10 ramp , L 1 resist.                 PT Short Term Goals - 02/22/19 1235      PT SHORT TERM GOAL #1   Title Pt will be consistent and I with HEP for core, large muscle group strengthening    Status Achieved      PT SHORT TERM GOAL #2   Title Pt will use RPE and HR monitor (Fitbit) to monitor self while doing cardio    Status Achieved      PT SHORT TERM GOAL #3   Title Pt will be able to report feeling less fatigue post work, able to do simple core work after a few hours of rest.    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    Time 8    Status Achieved    Target Date 01/31/20      PT LONG TERM GOAL #4   Title Pt will be able to complete 30 min of cardio without undue fatigue    Baseline unable to do    Time 8    Period Weeks    Status On-going    Target Date 01/31/20      PT LONG TERM GOAL #5   Title Pt will be able to show 5/5 strength in core and LEs    Baseline R/L  hip abd 4+/5,  , bilat. adductor 4/5. , bilateral hip ext 5/5, poor endurance, see flowsheets    Time 8    Period Weeks    Status On-going    Target Date 01/31/20      PT LONG TERM GOAL #6   Title Pt will be able to take 2 flights of stairs to  access home, work multiple floors without excessive fatigue    Baseline can do 2 flights but very tired    Time 8    Period Weeks    Status On-going    Target Date 01/31/20      PT LONG TERM GOAL #7   Title Patient will be able to tolerate change in elevation with exercises (plank to standing) with no more than min difficulty.    Baseline can do with moderate difficulty, used to be unable    Time 8    Period Weeks    Status On-going    Target Date 01/31/20      PT LONG TERM GOAL #8   Title Pt will be able to stand up and walk throughout clinic 300 feet, without BP elevation >23 mm Hg systolic to show improved conditioning    Baseline NT    Time 8    Status On-going    Target Date 01/31/20      PT LONG TERM GOAL  #9   TITLE Pt will be abel to maintain a static posture with a procedure at work and show less sway as told to her by co-workers and improved proprioception.    Baseline I can correct it but i get tired    Time 8    Period Weeks    Status On-going    Target Date 01/31/20      PT LONG TERM GOAL  #10   TITLE Pt will be able to stand on each leg in SLS without trunk compensations for min dynamic activity and no LOB  Baseline increased sway    Time 8    Period Weeks    Status On-going    Target Date 01/31/20                 Plan - 12/13/19 1200    Clinical Impression Statement BP well controlled today.  Patient showed increased weakness with core exercises, poor endurance in core and LE muscles.  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  She was asymptomatic but focused on small range , isometric holds.  Will cont to provide monitoring for exercise including cardio and core/PIlates to improve tolerance for work.    Personal Factors and Comorbidities Time since onset of injury/illness/exacerbation;Other    Examination-Activity Limitations Stand;Locomotion  Level;Other    Examination-Participation Restrictions Community Activity    Stability/Clinical Decision Making Evolving/Moderate complexity    Rehab Potential Excellent    PT Frequency 1x / week    PT Duration 8 weeks    PT Treatment/Interventions ADLs/Self Care Home Management;Therapeutic activities;Patient/family education;Therapeutic exercise;Other (comment)    PT Next Visit Plan ERO, Dynamic Gait Index.  SLR for VMO.  pilates, lifting, squatting, cardio prior, standing, core.Elevation change, (mod. burpee, use wall or mirror for feeback)  INTERVALS, EMOM with step ups. Monitor BP throughout    PT Home Exercise Plan 4 way SLR, calf raise, bridge, supine knee lift (core), bridge with clam, march, bird dog. Supine 90/90 extension and dead bug for core. Tall kneeling (hinge, shoulder flex/ext) and seated ball/core, seated core strengthening. corner balance and tall kneeling    Consulted and Agree with Plan of Care Patient           Patient will benefit from skilled therapeutic intervention in order to improve the following deficits and impairments:  Postural dysfunction, Decreased activity tolerance, Cardiopulmonary status limiting activity, Decreased mobility, Dizziness  Visit Diagnosis: Dizziness and giddiness  Hypermobility syndrome  POTS (postural orthostatic tachycardia syndrome)     Problem List Patient Active Problem List   Diagnosis Date Noted  . Ehlers-Danlos syndrome 11/22/2019  . Lymphadenopathy 02/10/2011    Roney Youtz 12/13/2019, 12:09 PM  [place] [facility] [address] [place], [place], [address] Phone: [phone]   Fax:  [phone]  Name: Roberta [name] MRN: [id number] Date of Birth: Feb 22, 1991

## 2019-12-18 ENCOUNTER — Ambulatory Visit: Payer: Managed Care, Other (non HMO) | Admitting: Physical Therapy

## 2019-12-18 ENCOUNTER — Other Ambulatory Visit: Payer: Self-pay

## 2019-12-18 ENCOUNTER — Encounter: Payer: Self-pay | Admitting: Physical Therapy

## 2019-12-18 VITALS — BP 135/78

## 2019-12-18 DIAGNOSIS — M357 Hypermobility syndrome: Secondary | ICD-10-CM

## 2019-12-18 DIAGNOSIS — G90A Postural orthostatic tachycardia syndrome (POTS): Secondary | ICD-10-CM

## 2019-12-18 DIAGNOSIS — R42 Dizziness and giddiness: Secondary | ICD-10-CM | POA: Diagnosis not present

## 2019-12-18 DIAGNOSIS — I498 Other specified cardiac arrhythmias: Secondary | ICD-10-CM

## 2019-12-18 NOTE — Therapy (Signed)
[place], [place], [address] Phone: [phone]   Fax:  [phone]  Physical Therapy Treatment  Patient Details  Name: Roberta [name] MRN: [id number] Date of Birth: 06/18/1990 Referring Provider (PT): Dr. Deland Pretty , Dr. Margarito Courser [facility])    Encounter Date: 12/18/2019   PT End of Session - 12/18/19 0811    Visit Number 51    Number of Visits 58    Date for PT Re-Evaluation 01/31/20    PT Start Time 0747    PT Stop Time 0834    PT Time Calculation (min) 47 min    Activity Tolerance Patient tolerated treatment well    Behavior During Therapy [facility] for tasks assessed/performed           History reviewed. No pertinent past medical history.  History reviewed. No pertinent surgical history.  Vitals:   12/18/19 0750 12/18/19 0842  BP: 134/76 135/78     Subjective Assessment - 12/18/19 0806    Subjective I have an abdominal binder the cardiologist for work and it helps.  Is that ok? I dont have to lean as much.  Nauseous no pain.    Pertinent History POTS, hyperadrenergic type. Gastric tachycardia.    Currently in Pain? No/denies                   [facility] Adult PT Treatment/Exercise - 12/18/19 0001      Lumbar Exercises: Standing   Functional Squats Limitations Freemotion 3 plates x 15, added overhead lift 1 plate x 15    HR 614 to 120    Other Standing Lumbar Exercises 1/2 kneeling side facing trunk rotation x 10 each side, tall kneeling shoulder extension in hip hinge 2 x 10 alt arms and then thigh stretch (eccentric quads ) all Freemotion 1-2 plates.       Lumbar Exercises: Quadruped   Plank countertop 60 sec.  Added UE lift, LE lift for 1 min then combo for 1 min       Knee/Hip Exercises: Aerobic   Nustep L7 UE and LE for 5 min RPE 12       Shoulder Exercises: Standing   Extension Strengthening;Both;20 reps    Extension Weight (lbs) Freemotion x 2 plates    Row [id number];50  reps    Row Weight (lbs) Freemotion x 3 plates     Diagonals Weight (lbs) tall kneeling see lumbar                     PT Short Term Goals - 02/22/19 1235      PT SHORT TERM GOAL #1   Title Pt will be consistent and I with HEP for core, large muscle group strengthening    Status Achieved      PT SHORT TERM GOAL #2   Title Pt will use RPE and HR monitor (Fitbit) to monitor self while doing cardio    Status Achieved      PT SHORT TERM GOAL #3   Title Pt will be able to report feeling less fatigue post work, able to do simple core work after a few hours of rest.    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    Time 8    Status Achieved    Target Date 01/31/20      PT LONG TERM GOAL #4   Title Pt will be able to complete 30 min of cardio without undue fatigue    Baseline unable to do    Time 8    Period Weeks    Status On-going    Target Date 01/31/20      PT LONG TERM GOAL #5   Title Pt will be able to show 5/5 strength in core and LEs    Baseline R/L  hip abd 4+/5,  , bilat. adductor 4/5. , bilateral hip ext 5/5, poor endurance, see flowsheets    Time 8    Period Weeks    Status On-going    Target Date 01/31/20      PT LONG TERM GOAL #6   Title Pt will be able to take 2 flights of stairs  to access home, work multiple floors without excessive fatigue    Baseline can do 2 flights but very tired    Time 8    Period Weeks    Status On-going    Target Date 01/31/20      PT LONG TERM GOAL #7   Title Patient will be able to tolerate change in elevation with exercises (plank to standing) with no more than min difficulty.    Baseline can do with moderate difficulty, used to be unable    Time 8    Period Weeks    Status On-going    Target Date 01/31/20      PT LONG TERM GOAL #8   Title Pt will be able to stand up and walk throughout clinic 300 feet, without BP elevation >85 mm Hg systolic to show improved conditioning    Baseline NT    Time 8    Status On-going    Target Date 01/31/20      PT LONG TERM GOAL  #9   TITLE Pt will be abel to maintain a static posture with a procedure at work and show less sway as told to her by co-workers and improved proprioception.    Baseline I can correct it but i get tired    Time 8    Period Weeks    Status On-going    Target Date 01/31/20      PT LONG TERM GOAL  #10   TITLE Pt will be able to stand on each leg in SLS without trunk compensations for min dynamic activity and no LOB    Baseline increased sway    Time 8    Period Weeks    Status On-going    Target Date 01/31/20  Plan - 12/18/19 0842    Clinical Impression Statement Able to increase workload today with compound exercises with attention to core, compensations and stability.  BP WFL throughout.  Needed 2-3 seated rest breaks.  Modified plank showed good form and control with min dynamic limb movements.    PT Treatment/Interventions ADLs/Self Care Home Management;Therapeutic activities;Patient/family education;Therapeutic exercise;Other (comment)    PT Next Visit Plan SLR for VMO.  pilates, lifting, squatting, cardio prior, standing, core.Elevation change, (mod. burpee, use wall or mirror for feeback)  INTERVALS, EMOM with step ups. Monitor BP  throughout    PT Home Exercise Plan 4 way SLR, calf raise, bridge, supine knee lift (core), bridge with clam, march, bird dog. Supine 90/90 extension and dead bug for core. Tall kneeling (hinge, shoulder flex/ext) and seated ball/core, seated core strengthening. corner balance and tall kneeling    Consulted and Agree with Plan of Care Patient           Patient will benefit from skilled therapeutic intervention in order to improve the following deficits and impairments:  Postural dysfunction, Decreased activity tolerance, Cardiopulmonary status limiting activity, Decreased mobility, Dizziness  Visit Diagnosis: Dizziness and giddiness  Hypermobility syndrome  POTS (postural orthostatic tachycardia syndrome)     Problem List Patient Active Problem List   Diagnosis Date Noted  . Ehlers-Danlos syndrome 11/22/2019  . Lymphadenopathy 02/10/2011    Sherlonda Flater 12/18/2019, 8:44 AM  [facility] [address] [place], [place], [address] Phone: [phone]   Fax:  [phone]  Name: Roberta [name] MRN: [id number] Date of Birth: 1990-07-17  [name], PT 12/18/19 8:45 AM Phone: [phone] Fax: [phone]

## 2019-12-20 ENCOUNTER — Encounter: Payer: Managed Care, Other (non HMO) | Admitting: Physical Therapy

## 2019-12-27 ENCOUNTER — Ambulatory Visit: Payer: Managed Care, Other (non HMO) | Attending: Internal Medicine | Admitting: Physical Therapy

## 2019-12-27 ENCOUNTER — Other Ambulatory Visit: Payer: Self-pay

## 2019-12-27 VITALS — BP 121/81

## 2019-12-27 DIAGNOSIS — G90A Postural orthostatic tachycardia syndrome (POTS): Secondary | ICD-10-CM

## 2019-12-27 DIAGNOSIS — M357 Hypermobility syndrome: Secondary | ICD-10-CM | POA: Diagnosis present

## 2019-12-27 DIAGNOSIS — I498 Other specified cardiac arrhythmias: Secondary | ICD-10-CM | POA: Diagnosis present

## 2019-12-27 DIAGNOSIS — R42 Dizziness and giddiness: Secondary | ICD-10-CM | POA: Diagnosis present

## 2019-12-27 NOTE — Therapy (Signed)
[facility] [address] [place], [place], [address] Phone: [phone]   Fax:  [phone]  Physical Therapy Treatment  Patient Details  Name: Roberta [name] MRN: [id number] Date of Birth: 10/06/1990 Referring Provider (PT): Dr. Walter Pharr , Dr. Sameh Mobarek ([facility])    Encounter Date: 12/27/2019   PT End of Session - 12/27/19 1049    Visit Number 52    Number of Visits 58    Date for PT Re-Evaluation 01/31/20    PT Start Time 0748    PT Stop Time 0832    PT Time Calculation (min) 44 min           No past medical history on file.  No past surgical history on file.  Vitals:   12/27/19 0752 12/27/19 0809 12/27/19 0829  BP: 126/74 (!) 134/97 121/81     Subjective Assessment - 12/27/19 0753    Subjective I may have SIBO.  Next week I have a breath test in [place] . She reports she is bloated and throws up after eating.    Currently in Pain? No/denies                             [facility] Adult PT Treatment/Exercise - 12/27/19 0001      Lumbar Exercises: Aerobic   Nustep 8 min L5 UE and LE RPE 11       Lumbar Exercises: Machines for Strengthening   Cybex Knee Extension 20 lbs x 2 x 15     Cybex Knee Flexion 30 lbs 2 x 15     Other Lumbar Machine Exercise Chest press 20 lbs 2 x 15 reps       Knee/Hip Exercises: Standing   Lunge Walking - Round Trips lunge knee taps on BOSU x 10     Other Standing Knee Exercises tall kneeling on BOSU: shoulder ext and sidefacing rotation     Other Standing Knee Exercises lunge with theraband on BOSU- chest press, cirlces unilateral for core                   PT Education - 12/27/19 1049    Education Details resources for SIBO, GI issues    Person(s) Educated Patient    Methods Explanation    Comprehension Verbalized understanding            PT Short Term Goals - 02/22/19 1235      PT SHORT TERM GOAL #1   Title Pt will be consistent and I with  HEP for core, large muscle group strengthening    Status Achieved      PT SHORT TERM GOAL #2   Title Pt will use RPE and HR monitor (Fitbit) to monitor self while doing cardio    Status Achieved      PT SHORT TERM GOAL #3   Title Pt will be able to report feeling less fatigue post work, able to do simple core work after a few hours of rest.    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    Time 8    Status Achieved    Target Date 01/31/20      PT LONG TERM GOAL #4   Title Pt will be able to complete 30 min of cardio without undue fatigue    Baseline unable to do    Time 8    Period Weeks    Status On-going    Target Date 01/31/20      PT LONG TERM GOAL #5   Title Pt will be able to show 5/5 strength in core and LEs    Baseline R/L  hip abd 4+/5,  , bilat. adductor 4/5. , bilateral hip ext 5/5, poor endurance, see flowsheets    Time 8    Period Weeks    Status On-going    Target Date 01/31/20      PT LONG TERM GOAL #6   Title Pt will be able to take 2 flights of stairs to access home, work multiple floors without excessive fatigue    Baseline can do 2 flights but very tired    Time 8    Period Weeks    Status On-going    Target Date 01/31/20      PT LONG TERM GOAL #7   Title Patient will be able to  tolerate change in elevation with exercises (plank to standing) with no more than min difficulty.    Baseline can do with moderate difficulty, used to be unable    Time 8    Period Weeks    Status On-going    Target Date 01/31/20      PT LONG TERM GOAL #8   Title Pt will be able to stand up and walk throughout clinic 300 feet, without BP elevation >10 mm Hg systolic to show improved conditioning    Baseline NT    Time 8    Status On-going    Target Date 01/31/20      PT LONG TERM GOAL  #9   TITLE Pt will be abel to maintain a static posture with a procedure at work and show less sway as told to her by co-workers and improved proprioception.    Baseline I can correct it but i get tired    Time 8    Period Weeks    Status On-going    Target Date 01/31/20      PT LONG TERM GOAL  #10   TITLE Pt will be able to stand on each leg in SLS without trunk compensations for min dynamic activity and no LOB    Baseline increased sway    Time 8    Period Weeks    Status On-going    Target Date 01/31/20                 Plan - 12/27/19 1050    Clinical Impression Statement Patient   does not feel well in general due to GI issues but she was able to exercise.  BP  monitored thorughout and was stable.  She is dehydrated but drinking water makes her nauseous, sipped during session. Cont POC to address endurance, core stability and full body strength.    PT Treatment/Interventions ADLs/Self Care Home Management;Therapeutic activities;Patient/family education;Therapeutic exercise;Other (comment)    PT Next Visit Plan SLR for VMO.  pilates, lifting, squatting, cardio prior, standing, core.Elevation change, (mod. burpee, use wall or mirror for feeback)  INTERVALS, EMOM with step ups. Monitor BP throughout    PT Home Exercise Plan 4 way SLR, calf raise, bridge, supine knee lift (core), bridge with clam, march, bird dog. Supine 90/90 extension and dead bug for core. Tall kneeling (hinge, shoulder  flex/ext) and seated ball/core, seated core strengthening. corner balance and tall kneeling    Consulted and Agree with Plan of Care Patient           Patient will benefit from skilled therapeutic intervention in order to improve the following deficits and impairments:  Postural dysfunction, Decreased activity tolerance, Cardiopulmonary status limiting activity, Decreased mobility, Dizziness  Visit Diagnosis: Dizziness and giddiness  Hypermobility syndrome  POTS (postural orthostatic tachycardia syndrome)     Problem List Patient Active Problem List   Diagnosis Date Noted  . Ehlers-Danlos syndrome 11/22/2019  . Lymphadenopathy 02/10/2011    PAA,JENNIFER 12/27/2019, 10:52 AM  [facility] [address] [place], [place], [address] Phone: [phone]   Fax:  [phone]  Name: Roberta [name] MRN: [id number] Date of Birth: 05/21/1990  Jennifer Paa, PT 12/27/19 10:52 AM Phone: [phone] Fax: [phone]   

## 2020-01-03 ENCOUNTER — Ambulatory Visit: Payer: Managed Care, Other (non HMO) | Admitting: Physical Therapy

## 2020-01-03 ENCOUNTER — Encounter: Payer: Self-pay | Admitting: Physical Therapy

## 2020-01-03 ENCOUNTER — Other Ambulatory Visit: Payer: Self-pay

## 2020-01-03 VITALS — BP 138/81 | HR 98

## 2020-01-03 DIAGNOSIS — I498 Other specified cardiac arrhythmias: Secondary | ICD-10-CM

## 2020-01-03 DIAGNOSIS — R42 Dizziness and giddiness: Secondary | ICD-10-CM

## 2020-01-03 DIAGNOSIS — M357 Hypermobility syndrome: Secondary | ICD-10-CM

## 2020-01-03 DIAGNOSIS — G90A Postural orthostatic tachycardia syndrome (POTS): Secondary | ICD-10-CM

## 2020-01-03 NOTE — Therapy (Signed)
[place], [place], [address] Phone: [phone]   Fax:  [phone]  Physical Therapy Treatment  Patient Details  Name: Roberta [name] MRN: [id number] Date of Birth: 10/16/1990 Referring Provider (PT): Dr. Deland Pretty , Dr. Margarito Courser [facility])    Encounter Date: 01/03/2020   PT End of Session - 01/03/20 1502    Visit Number 53    Number of Visits 58    Date for PT Re-Evaluation 01/31/20    PT Start Time 1502    PT Stop Time 1545    PT Time Calculation (min) 43 min    Activity Tolerance Patient tolerated treatment well    Behavior During Therapy [facility] for tasks assessed/performed           History reviewed. No pertinent past medical history.  History reviewed. No pertinent surgical history.  Vitals:   01/03/20 1504 01/03/20 1542  BP: 131/64 138/81  Pulse: 81 98  SpO2: 99% 91%     Subjective Assessment - 01/03/20 1503    Subjective "Things going good, BP under control"    Currently in Pain? No/denies              [facility] PT Assessment - 01/03/20 0001      Assessment   Medical Diagnosis EDS/POTS     Referring Provider (PT) Dr. Deland Pretty , Dr. Margarito Courser [facility])                          [facility] Adult PT Treatment/Exercise - 01/03/20 0001      Lumbar Exercises: Aerobic   Nustep L6 x 8 min UE/LE  RPE 13      Lumbar Exercises: Standing   Functional Squats Limitations Freemotion rows 2 x 10 with 2 plates,    feet in tandem position to work on balance alt lead leg     Lumbar Exercises: Seated   Other Seated Lumbar Exercises seated V sit 2 x 10 sec on with feet on the floor, 3 x 15 second leaning back with hip flexion      Knee/Hip Exercises: Standing   Functional Squat 2 sets;10 reps   with bil HHA for free motion   Functional Squat Limitations cues to keep knees in neutral position.    Lunge Walking - Round Trips lunge knee taps on BOSU x 10                 Balance Exercises - 01/03/20 0001      Balance Exercises: Standing   Standing Eyes Opened Narrow base of support (BOS);Foam/compliant surface;2 reps;30 secs    Standing Eyes Closed Narrow base of support (BOS);Foam/compliant surface;2 reps;30 secs    Marching Foam/compliant surface;10 reps   x 2 sets   Heel Raises 20 reps;Both   standing on airx pad x 2 sets              PT Short Term Goals - 02/22/19 1235      PT SHORT TERM GOAL #1   Title Pt will be consistent and I with HEP for core, large muscle group strengthening    Status Achieved      PT SHORT TERM GOAL #2   Title Pt will use RPE and HR monitor (Fitbit) to monitor self while doing cardio    Status Achieved      PT SHORT TERM GOAL #3   Title Pt will be able to report feeling  less fatigue post work, able to do simple core work after a few hours of rest.    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    Time 8    Status Achieved    Target Date 01/31/20      PT LONG TERM GOAL #4   Title Pt will be able to complete 30 min of cardio without undue fatigue    Baseline unable to do    Time 8    Period Weeks    Status On-going    Target Date 01/31/20      PT LONG TERM GOAL #5   Title Pt will be able to show 5/5 strength in core and LEs    Baseline  R/L  hip abd 4+/5,  , bilat. adductor 4/5. , bilateral hip ext 5/5, poor endurance, see flowsheets    Time 8    Period Weeks    Status On-going    Target Date 01/31/20      PT LONG TERM GOAL #6   Title Pt will be able to take 2 flights of stairs to access home, work multiple floors without excessive fatigue    Baseline can do 2 flights but very tired    Time 8    Period Weeks    Status On-going    Target Date 01/31/20      PT LONG TERM GOAL #7   Title Patient will be able to tolerate change in elevation with exercises (plank to standing) with no more than min difficulty.    Baseline can do with moderate difficulty, used to be unable    Time 8    Period Weeks    Status On-going    Target Date 01/31/20      PT LONG TERM GOAL #8   Title Pt will be able to stand up and walk throughout clinic 300 feet, without BP elevation >23 mm Hg systolic to show improved conditioning    Baseline NT    Time 8    Status On-going    Target Date 01/31/20      PT LONG TERM GOAL  #9   TITLE Pt will be abel to maintain a static posture with a procedure at work and show less sway as told to her by co-workers and improved proprioception.    Baseline I can correct it but i get tired    Time 8    Period Weeks    Status On-going    Target  Date 01/31/20      PT LONG TERM GOAL  #10   TITLE Pt will be able to stand on each leg in SLS without trunk compensations for min dynamic activity and no LOB    Baseline increased sway    Time 8    Period Weeks    Status On-going    Target Date 01/31/20                 Plan - 01/03/20 1550    Clinical Impression Statement pt reports no issues today. pt requested to work more on standing balance today so focused on combination of shoulder strengtheing incorporating balance and dynamic balance activitieis which she did well but fatigued quickly. BP remained between 131/64  and 138/81 throughout session. finished with seated V- up progressing difficulty, she  did wel lwith all exercises reporting no issues end of session.    PT Treatment/Interventions ADLs/Self Care Home Management;Therapeutic activities;Patient/family education;Therapeutic exercise;Other (comment)    PT Next Visit Plan SLR for VMO.  pilates, lifting, squatting, cardio prior, standing, core.Elevation change, (mod. burpee, use wall or mirror for feeback)  INTERVALS, EMOM with step ups. Monitor BP throughout    PT Home Exercise Plan 4 way SLR, calf raise, bridge, supine knee lift (core), bridge with clam, march, bird dog. Supine 90/90 extension and dead bug for core. Tall kneeling (hinge, shoulder flex/ext) and seated ball/core, seated core strengthening. corner balance and tall kneeling    Consulted and Agree with Plan of Care Patient           Patient will benefit from skilled therapeutic intervention in order to improve the following deficits and impairments:  Postural dysfunction, Decreased activity tolerance, Cardiopulmonary status limiting activity, Decreased mobility, Dizziness  Visit Diagnosis: Dizziness and giddiness  Hypermobility syndrome  POTS (postural orthostatic tachycardia syndrome)     Problem List Patient Active Problem List   Diagnosis Date Noted  . Ehlers-Danlos syndrome 11/22/2019  . Lymphadenopathy 02/10/2011    Starr Lake PT, DPT, LAT, ATC  01/03/20  4:01 PM      [place] [facility] [address] [place], [place], [address] Phone: [phone]   Fax:  [phone]  Name: Roberta [name] MRN: [id number] Date of Birth: 1990-09-29

## 2020-01-10 ENCOUNTER — Encounter: Payer: Self-pay | Admitting: Physical Therapy

## 2020-01-10 ENCOUNTER — Other Ambulatory Visit: Payer: Self-pay

## 2020-01-10 ENCOUNTER — Ambulatory Visit: Payer: Managed Care, Other (non HMO) | Admitting: Physical Therapy

## 2020-01-10 VITALS — BP 144/90

## 2020-01-10 DIAGNOSIS — R42 Dizziness and giddiness: Secondary | ICD-10-CM | POA: Diagnosis not present

## 2020-01-10 DIAGNOSIS — G90A Postural orthostatic tachycardia syndrome (POTS): Secondary | ICD-10-CM

## 2020-01-10 DIAGNOSIS — M357 Hypermobility syndrome: Secondary | ICD-10-CM

## 2020-01-10 DIAGNOSIS — I498 Other specified cardiac arrhythmias: Secondary | ICD-10-CM

## 2020-01-10 NOTE — Therapy (Signed)
[place], [place], [address] Phone: [phone]   Fax:  [phone]  Physical Therapy Treatment  Patient Details  Name: Roberta [name] MRN: [id number] Date of Birth: [birth year] Referring Provider (PT): Dr. Deland Pretty , Dr. Margarito Courser [facility])    Encounter Date: 01/10/2020   PT End of Session - 01/10/20 0746    Visit Number 54    Number of Visits 58    Date for PT Re-Evaluation 01/31/20    PT Start Time 0747    PT Stop Time 0831    PT Time Calculation (min) 44 min    Activity Tolerance Patient tolerated treatment well    Behavior During Therapy [facility] for tasks assessed/performed           History reviewed. No pertinent past medical history.  History reviewed. No pertinent surgical history.  Vitals:   01/10/20 0810  BP: (!) 144/90     Subjective Assessment - 01/10/20 0749    Subjective I have to get an infusion today, 1 L x 2 /week for 3 mos.  Still having trouble drinking or /and eating.    Currently in Pain? No/denies             [facility] Adult PT Treatment/Exercise - 01/10/20 0001      Lumbar Exercises: Aerobic   Elliptical 5 min L6 ramp and L1 resistance       Lumbar Exercises: Standing   Functional Squats Limitations multiple sets: 15 lbs KB , added upright row (15 lbs)  and overhead lift (red looped band)     Lifting Limitations single leg hip hinge with red looped band x 15 each     Other Standing Lumbar Exercises looped red band in mini squat with small ROM horiz abd and ER x 10 each     Other Standing Lumbar Exercises overhead lift x 10 then semi-circles       Shoulder Exercises: ROM/Strengthening   Other ROM/Strengthening Exercises suitcase carry 15 lbs 2 laps                     PT Short Term Goals - 02/22/19 1235      PT SHORT TERM GOAL #1   Title Pt will be consistent and I with HEP for core, large muscle group strengthening    Status Achieved      PT SHORT  TERM GOAL #2   Title Pt will use RPE and HR monitor (Fitbit) to monitor self while doing cardio    Status Achieved      PT SHORT TERM GOAL #3   Title Pt will be able to report feeling less fatigue post work, able to do simple core work after a few hours of rest.    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    Time 8    Status Achieved    Target Date 01/31/20      PT LONG TERM GOAL #4   Title Pt will be able to complete 30 min of cardio without undue fatigue    Baseline unable to do    Time 8    Period Weeks    Status On-going    Target Date 01/31/20      PT LONG TERM GOAL #5   Title Pt will be able to show 5/5 strength in core and LEs    Baseline R/L  hip abd 4+/5,  , bilat. adductor 4/5. , bilateral hip ext 5/5, poor endurance, see flowsheets    Time 8    Period Weeks    Status On-going    Target Date 01/31/20      PT LONG TERM GOAL #6   Title Pt will be able to take 2 flights of stairs to access home, work multiple floors without excessive fatigue    Baseline can do 2 flights but very tired    Time 8    Period Weeks    Status On-going    Target Date 01/31/20      PT LONG TERM GOAL #7   Title Patient will be able to tolerate change in elevation with exercises (plank to standing) with no more than  min difficulty.    Baseline can do with moderate difficulty, used to be unable    Time 8    Period Weeks    Status On-going    Target Date 01/31/20      PT LONG TERM GOAL #8   Title Pt will be able to stand up and walk throughout clinic 300 feet, without BP elevation >10 mm Hg systolic to show improved conditioning    Baseline NT    Time 8    Status On-going    Target Date 01/31/20      PT LONG TERM GOAL  #9   TITLE Pt will be abel to maintain a static posture with a procedure at work and show less sway as told to her by co-workers and improved proprioception.    Baseline I can correct it but i get tired    Time 8    Period Weeks    Status On-going    Target Date 01/31/20      PT LONG TERM GOAL  #10   TITLE Pt will be able to stand on each leg in SLS without trunk compensations for min dynamic activity and no LOB    Baseline increased sway    Time 8    Period Weeks    Status On-going    Target Date 01/31/20                 Plan - 01/10/20 0807    Clinical Impression Statement BP on exit 137/80, remained in the range of 127 to 144/79-90 diastolic.  She continues to have dyspnea , even with walking in hallways at work.  Continues to benefit from PT for conditioning and monitored exercise.  Interviewing for a new job today. Also getting IV fluids to  see if that can make a difference for her nausea, inabiltiy to eat and drink.    PT Treatment/Interventions ADLs/Self Care Home Management;Therapeutic activities;Patient/family education;Therapeutic exercise;Other (comment)    PT Next Visit Plan SLR for VMO.  pilates, lifting, squatting, cardio prior, standing, core.Elevation change, (mod. burpee, use wall or mirror for feeback)  INTERVALS, EMOM with step ups. Monitor BP throughout    PT Home Exercise Plan 4 way SLR, calf raise, bridge, supine knee lift (core), bridge with clam, march, bird dog. Supine 90/90 extension and dead bug for core. Tall kneeling (hinge, shoulder flex/ext)  and seated ball/core, seated core strengthening. corner balance and tall kneeling    Consulted and Agree with Plan of Care Patient           Patient will benefit from skilled therapeutic intervention in order to improve the following deficits and impairments:  Postural dysfunction, Decreased activity tolerance, Cardiopulmonary status limiting activity, Decreased mobility, Dizziness  Visit Diagnosis: Dizziness and giddiness  Hypermobility syndrome  POTS (postural orthostatic tachycardia syndrome)     Problem List Patient Active Problem List   Diagnosis Date Noted  . Ehlers-Danlos syndrome 11/22/2019  . Lymphadenopathy 02/10/2011    Roberta [name] 01/10/2020, 1:32 PM  [facility] [address] [place], [place], [address] Phone: [phone]   Fax:  [phone]  Name: Roberta [name] MRN: [id number] Date of Birth: [birth year]  [name], PT 01/10/20 1:33 PM Phone: [phone] Fax: [phone]

## 2020-01-17 ENCOUNTER — Ambulatory Visit: Payer: Managed Care, Other (non HMO) | Admitting: Physical Therapy

## 2020-02-01 ENCOUNTER — Ambulatory Visit: Payer: Managed Care, Other (non HMO) | Admitting: Physical Therapy

## 2020-02-07 ENCOUNTER — Other Ambulatory Visit: Payer: Self-pay

## 2020-02-07 ENCOUNTER — Ambulatory Visit: Payer: Managed Care, Other (non HMO) | Attending: Internal Medicine | Admitting: Physical Therapy

## 2020-02-07 ENCOUNTER — Encounter: Payer: Self-pay | Admitting: Physical Therapy

## 2020-02-07 VITALS — BP 129/94 | HR 83

## 2020-02-07 DIAGNOSIS — R42 Dizziness and giddiness: Secondary | ICD-10-CM | POA: Insufficient documentation

## 2020-02-07 DIAGNOSIS — I498 Other specified cardiac arrhythmias: Secondary | ICD-10-CM | POA: Diagnosis present

## 2020-02-07 DIAGNOSIS — M357 Hypermobility syndrome: Secondary | ICD-10-CM | POA: Diagnosis present

## 2020-02-07 DIAGNOSIS — G90A Postural orthostatic tachycardia syndrome (POTS): Secondary | ICD-10-CM

## 2020-02-07 NOTE — Therapy (Signed)
[place], [place], [address] Phone: [phone]   Fax:  [phone]  Physical Therapy Treatment  Patient Details  Name: Roberta [name] MRN: [id number] Date of Birth: 1990/05/07 Referring Provider (PT): Dr. Deland Pretty , Dr. Margarito Courser [facility])    Encounter Date: 02/07/2020   PT End of Session - 02/07/20 0823    Visit Number 55    Number of Visits 63    Date for PT Re-Evaluation 04/03/20    PT Start Time 0745    PT Stop Time 0833    PT Time Calculation (min) 48 min    Activity Tolerance Patient tolerated treatment well    Behavior During Therapy [facility] for tasks assessed/performed           History reviewed. No pertinent past medical history.  History reviewed. No pertinent surgical history.  Vitals:   02/07/20 0753 02/07/20 0800 02/07/20 0820 02/07/20 0830  BP: (!) 148/94 (!) 143/91 (!) 139/104 (!) 129/94  Pulse: 83     SpO2: 99%        Subjective Assessment - 02/07/20 0753    Subjective Pt not here for 3-4 weeks.  Began to have worsening POTS symptoms.  Now goes to POTS clinic weekly for monitoring and assessment.  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  Back hurts from dry heaving? Feeling bad overall, headache.    Pertinent History POTS, hyperadrenergic type. Gastric tachycardia, nodule on esophagus, EDS-III  hypermobilty    Limitations Standing    Currently in Pain? 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 Deland Pretty , Dr. 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, family       Cognition   Overall Cognitive Status Within Functional Limits for tasks assessed      Sensation   Light Touch Appears Intact      Single Leg Stance   Comments increased effort on Rt LE, sway back but can maintain for 30 sec       Other:   Other/ Comments [place] 8/9       Posture/Postural Control   Postural  Limitations Rounded Shoulders;Forward head;Increased lumbar lordosis    Posture Comments knees hyperextend       Strength   Right Hip Extension 4+/5    Right Hip ABduction 4+/5    Right Hip ADduction 4/5    Left Hip Extension 4+/5    Left Hip ABduction 4+/5    Left Hip ADduction 4/5    Right Knee Flexion 4/5    Right Knee Extension 5/5    Left Knee Flexion 5/5    Left Knee Extension 5/5             [facility] Adult PT Treatment/Exercise - 02/07/20 0001      Lumbar Exercises: Stretches   Single Knee to Chest Stretch Limitations x 3 , 30 sec     Lower Trunk Rotation Limitations x 10       Lumbar Exercises: Supine   Pelvic Tilt 10 reps    Clam 15 reps    Clam Limitations unilateral and bilateral     Bent Knee Raise Limitations knee ext and flexion  10 each side     Bridge with [employer] 10 reps    Bridge with [employer] Limitations 2 sets     Bridge with clamshell 10 reps    Bridge with [employer] Limitations green band x 2 set                    PT Short Term Goals - 02/22/19  1235      PT SHORT TERM GOAL #1   Title Pt will be consistent and I with HEP for core, large muscle group strengthening    Status Achieved      PT SHORT TERM GOAL #2   Title Pt will use RPE and HR monitor (Fitbit) to monitor self while doing cardio    Status Achieved      PT SHORT TERM GOAL #3   Title Pt will be able to report feeling less fatigue post work, able to do simple core work after a few hours of rest.    Status Achieved             PT Long Term Goals - 02/07/20 0818      PT LONG TERM GOAL #1   Title Pt will be able to show Independence with HEP and consistent cardio routine    Baseline 5000 steps, has limited energy after work and on off days.  Elevated BP limits    Time 8    Period Weeks    Status On-going    Target Date 04/03/20      PT LONG TERM GOAL #2   Title Patient will be able to squat, lift up to 25 lbs x 10 with no pain, just min fatigue    Baseline fatigue, has not had to lift now that job has been modified    Time 8    Period Weeks    Status Partially Met    Target Date 04/03/20      PT LONG TERM GOAL #3   Title Pt will be able to see 2 patients at work consecutively without need to rest, document.    Baseline met, but now in NICU, hard to stand 30 min    Status Achieved    Target Date 04/03/20      PT LONG TERM GOAL #4   Title Pt will be able to complete 30 min of cardio without undue fatigue    Baseline unable to do  Time 8    Period Weeks    Status On-going    Target Date 04/03/20      PT LONG TERM GOAL #5   Title Pt will be able to show 5/5 strength in core and LEs    Baseline R/L  hip abd 4+/5,  , bilat. adductor 4/5. , bilateral hip ext 5/5, poor endurance, see flowsheets    Time 8    Period Weeks    Status On-going    Target Date 04/03/20      PT LONG TERM GOAL #6   Title Pt will be able to take 2 flights of stairs to access home, work multiple floors without excessive fatigue    Baseline takes the elevator and her job has  changed    Time 8    Period Weeks    Status On-going    Target Date 04/03/20      PT LONG TERM GOAL #7   Title Patient will be able to tolerate change in elevation with exercises (plank to standing) with no more than min difficulty.    Baseline can do with moderate difficulty, used to be unable    Time 8    Period Weeks    Status On-going    Target Date 04/03/20      PT LONG TERM GOAL #8   Title Pt will be able to stand up and walk throughout clinic 300 feet, without BP elevation >62 mm Hg systolic to show improved conditioning    Baseline NT, DBP in 90's today    Status On-going    Target Date 04/03/20      PT LONG TERM GOAL  #9   TITLE Pt will be able to maintain a static posture with a procedure at work and show less sway as told to her by co-workers and improved proprioception.    Baseline unable to do this > 20-30 min (FEES)    Time 8    Status On-going    Target Date 04/03/20      PT LONG TERM GOAL  #10   TITLE Pt will be able to stand on each leg in SLS without trunk compensations for min dynamic activity and no LOB    Baseline increased sway, can do with compensations. Rt LE weaker than Lt    Time 8    Period Weeks    Status On-going    Target Date 04/03/20                 Plan - 02/07/20 1656    Clinical Impression Statement Narcissus has had to miss a couple of appts due to a POTS flare up with symptomatic HTN and GI distress.  She has been more fatigued, short of breath than previous.  She is hoping to regain some function as she is treated with IV infusion and undergoing more testing with Dr. Margrett Rud. She is not safe to exercise on her own due to uncontrolled BP but does need continued core and endurance training. She was renewed today for 8 more weeks of PT.    Personal Factors and Comorbidities Time since onset of injury/illness/exacerbation;Other;Comorbidity 3+    Comorbidities EDS, POTS, GI, anxiety    Examination-Activity Limitations Stand;Locomotion  Level;Other    Examination-Participation Restrictions Community Activity;Occupation    Stability/Clinical Decision Making Unstable/Unpredictable    Clinical Decision Making High    Rehab Potential Excellent    PT Frequency 1x / week    PT Duration 8 weeks  PT Treatment/Interventions ADLs/Self Care Home Management;Therapeutic activities;Patient/family education;Therapeutic exercise;Other (comment)    PT Next Visit Plan back pain? Progress cardio  if BP stable. SLR for VMO.  pilates, lifting, squatting, cardio prior, standing, core.Elevation change, (mod. burpee, use wall or mirror for feeback)  INTERVALS, EMOM with step ups. Monitor BP throughout    PT Home Exercise Plan 4 way SLR, calf raise, bridge, supine knee lift (core), bridge with clam, march, bird dog. Supine 90/90 extension and dead bug for core. Tall kneeling (hinge, shoulder flex/ext) and seated ball/core, seated core strengthening. corner balance and tall kneeling    Consulted and Agree with Plan of Care Patient           Patient will benefit from skilled therapeutic intervention in order to improve the following deficits and impairments:  Postural dysfunction, Decreased activity tolerance, Cardiopulmonary status limiting activity, Decreased mobility, Dizziness, Pain  Visit Diagnosis: Dizziness and giddiness  Hypermobility syndrome  POTS (postural orthostatic tachycardia syndrome)     Problem List Patient Active Problem List   Diagnosis Date Noted  . Ehlers-Danlos syndrome 11/22/2019  . Lymphadenopathy 02/10/2011    Roberta [name] 02/07/2020, 5:03 PM  [facility] [address] [place], [place], [address] Phone: [phone]   Fax:  [phone]  Name: Roberta [name] MRN: [id number] Date of Birth: 03-25-1990  [name], PT 02/07/20 5:03 PM Phone: [phone] Fax: [phone]

## 2020-02-07 NOTE — Patient Instructions (Signed)

## 2020-02-07 NOTE — Addendum Note (Signed)
Addended by: Karie Mainland L on: 02/07/2020 05:06 PM   Modules accepted: Orders

## 2020-02-21 ENCOUNTER — Other Ambulatory Visit: Payer: Self-pay

## 2020-02-21 ENCOUNTER — Ambulatory Visit: Payer: Managed Care, Other (non HMO) | Attending: Internal Medicine | Admitting: Physical Therapy

## 2020-02-21 DIAGNOSIS — R42 Dizziness and giddiness: Secondary | ICD-10-CM | POA: Insufficient documentation

## 2020-02-21 DIAGNOSIS — I498 Other specified cardiac arrhythmias: Secondary | ICD-10-CM | POA: Insufficient documentation

## 2020-02-21 DIAGNOSIS — M357 Hypermobility syndrome: Secondary | ICD-10-CM | POA: Diagnosis present

## 2020-02-21 DIAGNOSIS — G90A Postural orthostatic tachycardia syndrome (POTS): Secondary | ICD-10-CM

## 2020-02-21 NOTE — Therapy (Signed)
[place], [place], [address] Phone: [phone]   Fax:  [phone]  Physical Therapy Treatment  Patient Details  Name: Roberta [name] MRN: [id number] Date of Birth: 1990-12-27 Referring Provider (PT): Dr. Deland Pretty , Dr. Margarito Courser [facility])    Encounter Date: 02/21/2020   PT End of Session - 02/21/20 0805    Visit Number 56    Number of Visits 63    Date for PT Re-Evaluation 04/03/20    PT Start Time 0745    PT Stop Time 0834    PT Time Calculation (min) 49 min    Activity Tolerance Patient tolerated treatment well    Behavior During Therapy [facility] for tasks assessed/performed           No past medical history on file.  No past surgical history on file.  There were no vitals filed for this visit.   Subjective Assessment - 02/21/20 0801    Subjective Was allergic to a nausea medicine but it really helped me.  They doubled my medicine and depending on how today goes.           Vital signs:  101/71 6 min walk HR  107  112  115  118 (taken every 90 sec or so)  Sa O2 97-99%  135/86 after walk   During standing exercises  122/76, 129/80   BP end of session 117/79      [facility] Adult PT Treatment/Exercise - 02/21/20 0001      Ambulation/Gait   Gait Comments 6 min walk test : 1640 feet, opened door with L hand each lap, see note for vitals       Lumbar Exercises: Aerobic   Other Aerobic Exercise walk test for warm up       Lumbar Exercises: Standing   Heel Raises Limitations with sit to stand x 15     Functional Squats Limitations 3 sets x 15 , added blue bands and 15 lbs KB     Lifting Weights (lbs) suitcase march 15 lbs KB x 2 x 15    had to modify due to instability in core and ankle    Forward Lunge Limitations 1/2 kneeling blue band arrow pull x 10 each (T-rotation)     Wall Slides 15 reps    Wall Slides Limitations 15 lbs , add arm press out 6 lbs     Other Standing Lumbar  Exercises 1/2 kneeling: 5 lbs shoulder press and chop x 15 each       Lumbar Exercises: Seated   Other Seated Lumbar Exercises balance with overhead lifting (on toes)                     PT Short Term Goals - 02/22/19 1235      PT SHORT TERM GOAL #1   Title Pt will be consistent and I with HEP for core, large muscle group strengthening    Status Achieved      PT SHORT TERM GOAL #2   Title Pt will use RPE and HR monitor (Fitbit) to monitor self while doing cardio    Status Achieved      PT SHORT TERM GOAL #3   Title Pt will be able to report feeling less fatigue post work, able to do simple core work after a few hours of rest.    Status Achieved             PT Long  Term Goals - 02/07/20 0818      PT LONG TERM GOAL #1   Title Pt will be able to show Independence with HEP and consistent cardio routine    Baseline 5000 steps, has limited energy after work and on off days.  Elevated BP limits    Time 8    Period Weeks    Status On-going    Target Date 04/03/20      PT LONG TERM GOAL #2   Title Patient will be able to squat, lift up to 25 lbs x 10 with no pain, just min fatigue    Baseline fatigue, has not had to lift now that job has been modified    Time 8    Period Weeks    Status Partially Met    Target Date 04/03/20      PT LONG TERM GOAL #3   Title Pt will be able to see 2 patients at work consecutively without need to rest, document.    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 adductor 4/5. , bilateral hip ext 5/5, poor endurance, see flowsheets    Time 8    Period Weeks     Status On-going    Target Date 04/03/20      PT LONG TERM GOAL #6   Title Pt will be able to take 2 flights of stairs to access home, work multiple floors without excessive fatigue    Baseline takes the elevator and her job has changed    Time 8    Period Weeks    Status On-going    Target Date 04/03/20      PT LONG TERM GOAL #7   Title Patient will be able to tolerate change in elevation with exercises (plank to standing) with no more than min difficulty.    Baseline can do with moderate difficulty, used to be unable    Time 8    Period Weeks    Status On-going    Target Date 04/03/20      PT LONG TERM GOAL #8   Title Pt will be able to stand up and walk throughout clinic 300 feet, without BP elevation >66 mm Hg systolic to show improved conditioning    Baseline NT, DBP in 90's today    Status On-going    Target Date 04/03/20      PT LONG TERM GOAL  #9   TITLE Pt will be able to maintain a static posture with a procedure at work and show less sway as told to her by co-workers and improved proprioception.    Baseline unable to do this > 20-30 min (FEES)    Time 8    Status On-going    Target Date 04/03/20      PT LONG TERM GOAL  #10   TITLE Pt will be able to stand on each leg  in SLS without trunk compensations for min dynamic activity and no LOB    Baseline increased sway, can do with compensations. Rt LE weaker than Lt    Time 8    Period Weeks    Status On-going    Target Date 04/03/20                 Plan - 02/21/20 1548    Clinical Impression Statement Vital signs more stable today with standing exercises.  Focused on stability in ankle, core, hips.  Use of the wall for maintaining trunk neutral is helpful.    PT Treatment/Interventions ADLs/Self Care Home Management;Therapeutic activities;Patient/family education;Therapeutic exercise;Other (comment)    PT Next Visit Plan back pain? Progress cardio  if BP stable. SLR for VMO.  pilates, lifting, squatting,  cardio prior, standing, core.Elevation change, (mod. burpee, use wall or mirror for feeback)  INTERVALS, EMOM with step ups. Monitor BP throughout    PT Home Exercise Plan 4 way SLR, calf raise, bridge, supine knee lift (core), bridge with clam, march, bird dog. Supine 90/90 extension and dead bug for core. Tall kneeling (hinge, shoulder flex/ext) and seated ball/core, seated core strengthening. corner balance and tall kneeling           Patient will benefit from skilled therapeutic intervention in order to improve the following deficits and impairments:  Postural dysfunction, Decreased activity tolerance, Cardiopulmonary status limiting activity, Decreased mobility, Dizziness, Pain  Visit Diagnosis: Dizziness and giddiness  Hypermobility syndrome  POTS (postural orthostatic tachycardia syndrome)     Problem List Patient Active Problem List   Diagnosis Date Noted  . Ehlers-Danlos syndrome 11/22/2019  . Lymphadenopathy 02/10/2011    Nike Southwell 02/21/2020, 9:01 AM  [facility] [address] [place], [place], [address] Phone: [phone]   Fax:  [phone]  Name: Roberta [name] MRN: [id number] Date of Birth: 02-15-1991  [name], PT 02/21/20 9:01 AM Phone: [phone] Fax: [phone]

## 2020-02-28 ENCOUNTER — Ambulatory Visit: Payer: Managed Care, Other (non HMO) | Admitting: Physical Therapy

## 2020-02-28 ENCOUNTER — Other Ambulatory Visit: Payer: Self-pay

## 2020-02-28 ENCOUNTER — Encounter: Payer: Self-pay | Admitting: Physical Therapy

## 2020-02-28 VITALS — BP 137/73

## 2020-02-28 DIAGNOSIS — I498 Other specified cardiac arrhythmias: Secondary | ICD-10-CM

## 2020-02-28 DIAGNOSIS — M357 Hypermobility syndrome: Secondary | ICD-10-CM

## 2020-02-28 DIAGNOSIS — R42 Dizziness and giddiness: Secondary | ICD-10-CM | POA: Diagnosis not present

## 2020-02-28 DIAGNOSIS — G90A Postural orthostatic tachycardia syndrome (POTS): Secondary | ICD-10-CM

## 2020-02-28 NOTE — Therapy (Signed)
[place], [place], [address] Phone: [phone]   Fax:  [phone]  Physical Therapy Treatment  Patient Details  Name: Roberta [name] MRN: [id number] Date of Birth: 1990-09-10 Referring Provider (PT): Dr. Deland Pretty , Dr. Margarito Courser [facility])    Encounter Date: 02/28/2020   PT End of Session - 02/28/20 0757    Visit Number 42    Number of Visits 63    Date for PT Re-Evaluation 04/03/20    PT Start Time 0745    PT Stop Time 0832    PT Time Calculation (min) 47 min    Activity Tolerance Patient tolerated treatment well    Behavior During Therapy [facility] for tasks assessed/performed           History reviewed. No pertinent past medical history.  History reviewed. No pertinent surgical history.  Vitals:   02/28/20 0755  BP: 137/73     Subjective Assessment - 02/28/20 0753    Subjective No complaints today.  She is going for infusion today.  Takes 12 weeks for this protocol.    Currently in Pain? No/denies          BP taken during exercise: 138/90 132/80 136/80 post exercise  HR 80-100 bpm  Sao2 98%   [facility] Adult PT Treatment/Exercise - 02/28/20 0001      Lumbar Exercises: Quadruped   Plank modified burpee edge of mat 10 reps x 2      Knee/Hip Exercises: Aerobic   Stationary Bike 8 min L2 hills      Knee/Hip Exercises: Standing   Hip Flexion Limitations march on BOSU x 10    Forward Lunges Limitations on BOSU x 10 each LE    Functional Squat Limitations on BOSU x 10 small ROM    Other Standing Knee Exercises added overhead reach, upper trunk rotation (used dowel) and then tall kneeling see below    Other Standing Knee Exercises tall kneeling BOSU bilateral row extension x 15 each, Palloff press and rotate blue band x 15 each sdie                    PT Short Term Goals - 02/22/19 1235      PT SHORT TERM GOAL #1   Title Pt will be consistent and I with HEP for core, large  muscle group strengthening    Status Achieved      PT SHORT TERM GOAL #2   Title Pt will use RPE and HR monitor (Fitbit) to monitor self while doing cardio    Status Achieved      PT SHORT TERM GOAL #3   Title Pt will be able to report feeling less fatigue post work, able to do simple core work after a few hours of rest.    Status Achieved             PT Long Term Goals - 02/07/20 0818      PT LONG TERM GOAL #1   Title Pt will be able to show Independence with HEP and consistent cardio routine    Baseline 5000 steps, has limited energy after work and on off days.  Elevated BP limits    Time 8    Period Weeks    Status On-going    Target Date 04/03/20      PT LONG TERM GOAL #2   Title Patient will be able to squat, lift up to 25 lbs x 10 with  no pain, just min fatigue    Baseline fatigue, has not had to lift now that job has been modified    Time 8    Period Weeks    Status Partially Met    Target Date 04/03/20      PT LONG TERM GOAL #3   Title Pt will be able to see 2 patients at work consecutively without need to rest, document.    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 adductor 4/5. , bilateral hip ext 5/5, poor endurance, see flowsheets    Time 8    Period Weeks    Status On-going    Target Date 04/03/20      PT LONG TERM GOAL #6   Title Pt will be able to take 2 flights of stairs to access home, work multiple floors without excessive fatigue    Baseline takes the elevator and her job has changed    Time 8    Period Weeks    Status On-going    Target Date 04/03/20      PT LONG  TERM GOAL #7   Title Patient will be able to tolerate change in elevation with exercises (plank to standing) with no more than min difficulty.    Baseline can do with moderate difficulty, used to be unable    Time 8    Period Weeks    Status On-going    Target Date 04/03/20      PT LONG TERM GOAL #8   Title Pt will be able to stand up and walk throughout clinic 300 feet, without BP elevation >51 mm Hg systolic to show improved conditioning    Baseline NT, DBP in 90's today    Status On-going    Target Date 04/03/20      PT LONG TERM GOAL  #9   TITLE Pt will be able to maintain a static posture with a procedure at work and show less sway as told to her by co-workers and improved proprioception.    Baseline unable to do this > 20-30 min (FEES)    Time 8    Status On-going    Target Date 04/03/20      PT LONG TERM GOAL  #10   TITLE Pt will be able to stand on each leg in SLS without trunk compensations for min dynamic activity and no LOB    Baseline increased sway, can do with compensations. Rt LE weaker than Lt    Time 8    Period Weeks    Status On-going    Target Date 04/03/20                 Plan - 02/28/20 1132    Clinical Impression Statement BP today was 130's /80's for most of session.  Focus on corrective exercises  for trunk control, core in standing.  No increased  pain  but needed several sitting rest breaks.  Began to do modified burpees for training elevation change tolerance.    PT Treatment/Interventions ADLs/Self Care Home Management;Therapeutic activities;Patient/family education;Therapeutic exercise;Other (comment)    PT Next Visit Plan Progress cardio  if BP stable. SLR for VMO.  pilates, lifting, squatting, cardio prior, standing, core.Elevation change, (mod. burpee, use wall or mirror for feeback)  INTERVALS, EMOM with step ups. Monitor BP throughout    PT Home Exercise Plan 4 way SLR, calf raise, bridge, supine knee lift (core), bridge with clam, march,  bird dog. Supine 90/90 extension and dead bug for core. Tall kneeling (hinge, shoulder flex/ext) and seated ball/core, seated core strengthening. corner balance and tall kneeling    Consulted and Agree with Plan of Care Patient           Patient will benefit from skilled therapeutic intervention in order to improve the following deficits and impairments:  Postural dysfunction,Decreased activity tolerance,Cardiopulmonary status limiting activity,Decreased mobility,Dizziness,Pain  Visit Diagnosis: Dizziness and giddiness  Hypermobility syndrome  POTS (postural orthostatic tachycardia syndrome)     Problem List Patient Active Problem List   Diagnosis Date Noted  . Ehlers-Danlos syndrome 11/22/2019  . Lymphadenopathy 02/10/2011    Lori Liew 02/28/2020, 11:34 AM  [facility] [address] [place], [place], [address] Phone: [phone]   Fax:  [phone]  Name: ANAYAH ARVANITIS MRN: [id number] Date of Birth: June 12, 1990  [name], PT 02/28/20 11:35 AM Phone: [phone] Fax: [phone]

## 2020-03-06 ENCOUNTER — Ambulatory Visit: Payer: Managed Care, Other (non HMO) | Admitting: Physical Therapy

## 2020-03-12 ENCOUNTER — Ambulatory Visit: Payer: Managed Care, Other (non HMO) | Admitting: Physical Therapy

## 2020-03-12 ENCOUNTER — Other Ambulatory Visit: Payer: Self-pay

## 2020-03-12 VITALS — BP 155/93

## 2020-03-12 DIAGNOSIS — G90A Postural orthostatic tachycardia syndrome (POTS): Secondary | ICD-10-CM

## 2020-03-12 DIAGNOSIS — R42 Dizziness and giddiness: Secondary | ICD-10-CM

## 2020-03-12 DIAGNOSIS — I498 Other specified cardiac arrhythmias: Secondary | ICD-10-CM

## 2020-03-12 DIAGNOSIS — M357 Hypermobility syndrome: Secondary | ICD-10-CM

## 2020-03-12 NOTE — Therapy (Addendum)
[place], [place], [address] Phone: [phone]   Fax:  [phone]  Physical Therapy Treatment/Discharge  Patient Details  Name: Roberta [name] MRN: [id number] Date of Birth: [birth year] Referring Provider (PT): Dr. Deland Pretty , Dr. Margarito Courser [facility])    Encounter Date: 03/12/2020   PT End of Session - 03/12/20 1550    Visit Number 46    Number of Visits 63    Date for PT Re-Evaluation 04/03/20    PT Start Time 5366    PT Stop Time 1615    PT Time Calculation (min) 45 min    Activity Tolerance Patient tolerated treatment well    Behavior During Therapy [facility] for tasks assessed/performed           No past medical history on file.  No past surgical history on file.  Vitals:   03/12/20 1545  BP: (!) 155/93     Subjective Assessment - 03/12/20 1550    Subjective Resting BP 128/80.  I want to do the ball exercises.               [place] Adult PT Treatment/Exercise - 03/12/20 0001      Lumbar Exercises: Stretches   Active Hamstring Stretch 2 reps;30 seconds    Hip Flexor Stretch 2 reps;30 seconds    ITB Stretch 2 reps;30 seconds      Lumbar Exercises: Seated   Long Arc Quad on [address] Strengthening;Both;1 set;20 reps    Hip Flexion on Ball 15 reps    Other Seated Lumbar Exercises single and double arm press with 3 lbs alternating, abduction single arm x 10 each, trunk rotation x 10 each side      Lumbar Exercises: Supine   Other Supine Lumbar Exercises ball seated rollout to shoulder bridge x 10      Knee/Hip Exercises: Aerobic   Elliptical 5 min L 10 ramp 1                    PT Short Term Goals - 02/22/19 1235      PT SHORT TERM GOAL #1   Title Pt will be consistent and I with HEP for core, large muscle group strengthening    Status Achieved      PT SHORT TERM GOAL #2   Title Pt will use RPE and HR monitor (Fitbit) to monitor self while doing cardio    Status Achieved       PT SHORT TERM GOAL #3   Title Pt will be able to report feeling less fatigue post work, able to do simple core work after a few hours of rest.    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 adductor 4/5. , bilateral hip ext 5/5, poor endurance, see flowsheets    Status Unable to assess      PT LONG TERM GOAL #6   Title Pt will be able to take 2 flights of stairs to access home, work multiple floors without excessive fatigue    Status On-going      PT LONG TERM GOAL #7   Title Patient will be able to tolerate change in elevation with exercises (plank to standing) with no more than min difficulty.    Baseline can do with moderate difficulty, used to be unable    Status On-going      PT LONG TERM GOAL #8   Title Pt will be able to stand up and walk throughout clinic 300 feet, without BP elevation >84 mm Hg systolic to show improved conditioning    Baseline unable to do today due to diastolic 98 mmHg      PT LONG TERM GOAL  #9   TITLE Pt will be able to maintain a static posture with a procedure at work and show less sway as told to her by co-workers and improved proprioception.    Status On-going      PT LONG TERM GOAL  #10   TITLE Pt will be able to  stand on each leg in SLS without trunk compensations for min dynamic activity and no LOB    Status On-going                 Plan - 03/12/20 1614    Clinical Impression Statement Pt elevated during session.  Diastolic in 53'M , pt with mild headache.  Worked on seated core and anti-rotation to minimze BP elevation.  Discussed possiblity of discharge in the coming weeks as her progress has slowed and she is dealing with multiple GI tests.    PT Treatment/Interventions ADLs/Self Care Home Management;Therapeutic activities;Patient/family education;Therapeutic exercise;Other (comment)    PT Next Visit Plan [place] BP, goals for treatment plan    PT Home Exercise Plan 4 way SLR, calf raise, bridge, supine knee lift (core), bridge with clam, march, bird dog. Supine 90/90 extension and dead bug for core. Tall kneeling (hinge, shoulder flex/ext) and seated ball/core, seated core strengthening. corner balance and tall kneeling    Consulted and Agree with Plan of Care Patient           Patient will benefit from skilled therapeutic intervention in order to improve the following deficits and impairments:  Postural dysfunction,Decreased activity tolerance,Cardiopulmonary status limiting activity,Decreased mobility,Dizziness,Pain  Visit Diagnosis: Dizziness and giddiness  Hypermobility syndrome  POTS (postural orthostatic tachycardia syndrome)     Problem List Patient Active Problem List   Diagnosis Date Noted  . Ehlers-Danlos syndrome 11/22/2019  . Lymphadenopathy 02/10/2011    Roberta [name] 03/12/2020, 4:47 PM  [facility] [address] [place], [place], [address] Phone: [phone]   Fax:  [phone]  Name: Roberta [name] MRN: [id number] Date of Birth: Mar 15, 1991  Roberta [name], PT 03/12/20 4:48 PM Phone: [phone] Fax: [phone]  PHYSICAL THERAPY DISCHARGE SUMMARY  Visits from Start of Care: 76  Current  functional  level related to goals / functional outcomes: See above    Remaining deficits: Cont with variable tachycardia, hypertension.  Joint instability, lack of core control and stability .     Education / Equipment: Extensive on Pilates, core, lifting, posture, body mechanics  Plan: Patient agrees to discharge.  Patient goals were partially met. Patient is being discharged due to a change in medical status.  ?????     Roberta [name], PT 05/27/20 9:14 AM Phone: [phone] Fax: [phone]

## 2020-03-19 ENCOUNTER — Ambulatory Visit: Payer: Managed Care, Other (non HMO) | Admitting: Physical Therapy

## 2020-03-28 ENCOUNTER — Ambulatory Visit (INDEPENDENT_AMBULATORY_CARE_PROVIDER_SITE_OTHER): Payer: 59 | Admitting: Adult Health

## 2020-03-28 ENCOUNTER — Other Ambulatory Visit: Payer: Self-pay

## 2020-03-28 ENCOUNTER — Encounter: Payer: Self-pay | Admitting: Adult Health

## 2020-03-28 VITALS — BP 133/84 | HR 65 | Ht 66.0 in | Wt 187.0 lb

## 2020-03-28 DIAGNOSIS — K31A14 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, involving the cardia: Secondary | ICD-10-CM | POA: Insufficient documentation

## 2020-03-28 DIAGNOSIS — F411 Generalized anxiety disorder: Secondary | ICD-10-CM | POA: Diagnosis not present

## 2020-03-28 DIAGNOSIS — R1110 Vomiting, unspecified: Secondary | ICD-10-CM | POA: Insufficient documentation

## 2020-03-28 DIAGNOSIS — R110 Nausea: Secondary | ICD-10-CM | POA: Insufficient documentation

## 2020-03-28 DIAGNOSIS — F331 Major depressive disorder, recurrent, moderate: Secondary | ICD-10-CM

## 2020-03-28 DIAGNOSIS — R112 Nausea with vomiting, unspecified: Secondary | ICD-10-CM | POA: Insufficient documentation

## 2020-03-28 MED ORDER — SERTRALINE HCL 100 MG PO TABS: ORAL_TABLET | ORAL | 5 refills | Status: DC

## 2020-03-28 NOTE — Progress Notes (Signed)
[facility] MD/PA/NP Initial Note  03/28/2020 10:48 AM Roberta [name]  MRN:  [id number]  Chief Complaint:   HPI:   Describes mood today as "not the best". Pleasant. Tearful at times. Mood symptoms - reports depression, anxiety, and irritability. Mood fluctuates. Mood has improved with addition of Zoloft in June 2021 - currently taking 100mg  daily. Feels medication is helpful. Stable interest and motivation. Taking medications as prescribed.  Energy levels lower. Active, does not have a regular exercise routine.  Enjoys some usual interests and activities. Single. Lives alone. Family local. Spending time with family. Appetite adequate. Weight stable - 187 pounds.. Sleeps well most nights. Averages 8 to 9 hours. Focus and concentration stable. Completing tasks. Managing aspects of household. Works full-time. Works as a at Human resources officer. Denies SI or HI.  Denies AH or VH.  Previous medication trials: Denies  Visit Diagnosis:    ICD-10-CM   1. Generalized anxiety disorder  F41.1 sertraline (ZOLOFT) 100 MG tablet  2. Major depressive disorder, recurrent episode, moderate (HCC)  F33.1     Past Psychiatric History: Denies psychiatric hospitalization.  Past Medical History: No past medical history on file. No past surgical history on file.  Family Psychiatric History: Denies any family history of mental illness.  Family History:  Family History  Problem Relation Age of Onset  . Lymphoma Paternal Grandfather     Social History:  Social History   Socioeconomic History  . Marital status: Single    Spouse name: Not on file  . Number of children: Not on file  . Years of education: Not on file  . Highest education level: Not on file  Occupational History  . Not on file  Tobacco Use  . Smoking status: Never Smoker  . Smokeless tobacco: Never Used  Substance and Sexual Activity  . Alcohol use: Not on file  . Drug use: Not on file  . Sexual activity: Not on  file  Other Topics Concern  . Not on file  Social History Narrative  . Not on file   Social Determinants of Health   Financial Resource Strain: Not on file  Food Insecurity: Not on file  Transportation Needs: Not on file  Physical Activity: Not on file  Stress: Not on file  Social Connections: Not on file    Allergies:  Allergies  Allergen Reactions  . Shellfish-Derived Products     Other reaction(s): hives, Unknown    Metabolic Disorder Labs: No results found for: HGBA1C, MPG No results found for: PROLACTIN No results found for: CHOL, TRIG, HDL, CHOLHDL, VLDL, LDLCALC No results found for: TSH  Therapeutic Level Labs: No results found for: LITHIUM No results found for: VALPROATE No components found for:  CBMZ  Current Medications: Current Outpatient Medications  Medication Sig Dispense Refill  . EPINEPHrine 0.3 mg/0.3 mL IJ SOAJ injection as needed.    [employer] atenolol (TENORMIN) 50 MG tablet Take 25 mg by mouth daily.     . cloNIDine (CATAPRES) 0.1 MG tablet Take 5 mg by mouth daily.     . ivabradine (CORLANOR) 5 MG TABS tablet Take 5 mg by mouth 2 (two) times daily with a meal.    . Norethindrone-Ethinyl Estradiol-Fe Biphas (LO LOESTRIN FE) 1 MG-10 MCG / 10 MCG tablet 1 tablet    . promethazine (PHENERGAN) 12.5 MG tablet Take by mouth.    . sertraline (ZOLOFT) 100 MG tablet Take two tablets daily. 60 tablet 5   No current facility-administered medications for this  visit.    Medication Side Effects: none  Orders placed this visit:  No orders of the defined types were placed in this encounter.   Psychiatric Specialty Exam:  Review of Systems  Musculoskeletal: Negative for gait problem.  Neurological: Negative for tremors.  Psychiatric/Behavioral:       Please refer to HPI    Blood pressure 133/84, pulse 65, height 5\' 6"  (1.676 m), weight 187 lb (84.8 kg).Body mass index is 30.18 kg/m.  General Appearance: Casual, Neat and Well Groomed  Eye Contact:  Good   Speech:  Clear and Coherent and Normal Rate  Volume:  Normal  Mood:  Anxious and Depressed  Affect:  Appropriate  Thought Process:  Coherent and Descriptions of Associations: Intact  Orientation:  Full (Time, Place, and Person)  Thought Content: Logical   Suicidal Thoughts:  No  Homicidal Thoughts:  No  Memory:  WNL  Judgement:  Good  Insight:  Good  Psychomotor Activity:  Normal  Concentration:  Concentration: Good  Recall:  Good  Fund of Knowledge: Good  Language: Good  Assets:  Communication Skills Desire for Improvement Financial Resources/Insurance Housing Intimacy Leisure Time Physical Health Resilience Social Support Talents/Skills Transportation Vocational/Educational  ADL's:  Intact  Cognition: WNL  Prognosis:  Good   Screenings:  MDQ Receiving Psychotherapy: Yes   Treatment Plan/Recommendations:   Plan:  PDMP reviewed  1. Increase Zoloft 100mg  to 150mg  daily - may increase to 200mg  daily.  RTC 4 weeks  Patient advised to contact office with any questions, adverse effects, or acute worsening in signs and symptoms.      , NP

## 2020-04-24 ENCOUNTER — Ambulatory Visit: Payer: 59 | Admitting: Adult Health

## 2020-05-29 ENCOUNTER — Ambulatory Visit: Payer: 59 | Admitting: Adult Health

## 2020-06-12 ENCOUNTER — Telehealth: Payer: 59 | Admitting: Adult Health

## 2020-07-02 DIAGNOSIS — N390 Urinary tract infection, site not specified: Secondary | ICD-10-CM | POA: Insufficient documentation

## 2020-07-02 DIAGNOSIS — F411 Generalized anxiety disorder: Secondary | ICD-10-CM | POA: Insufficient documentation

## 2020-07-02 DIAGNOSIS — F418 Other specified anxiety disorders: Secondary | ICD-10-CM | POA: Insufficient documentation

## 2020-07-11 IMAGING — RF DG ESOPHAGUS
5 series · 14 of 17 positions shown · non-contrast
Comparison: None.

CLINICAL DATA: Esophageal dysphagia and nausea.

EXAM:
ESOPHOGRAM / BARIUM SWALLOW / BARIUM TABLET STUDY
TECHNIQUE: Combined double contrast and single contrast examination performed
using effervescent crystals, thick barium liquid, and thin barium
liquid. The patient was observed with fluoroscopy swallowing a 13 mm
barium sulphate tablet.
FLUOROSCOPY TIME:  Fluoroscopy Time:  1 minutes 6 seconds
Radiation Exposure Index (if provided by the fluoroscopic device):
75 mGy
Number of Acquired Spot Images: 0

[Series 1: one shot · 3 of 4 slices shown (1 of 2)]
[im 1/4]
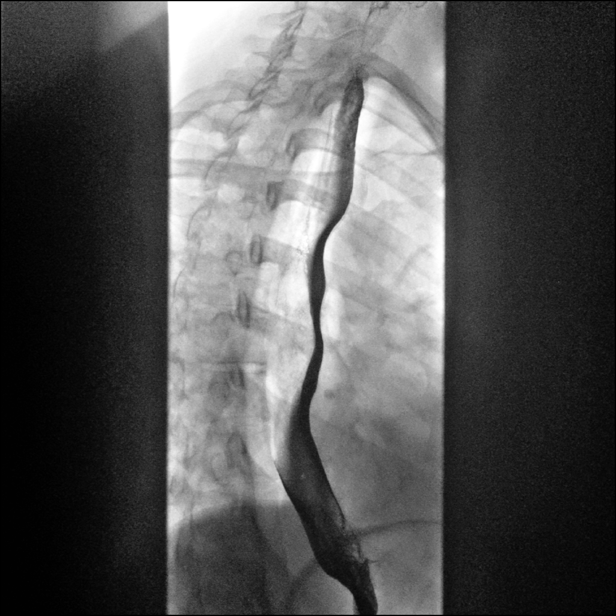
[im 2/4]
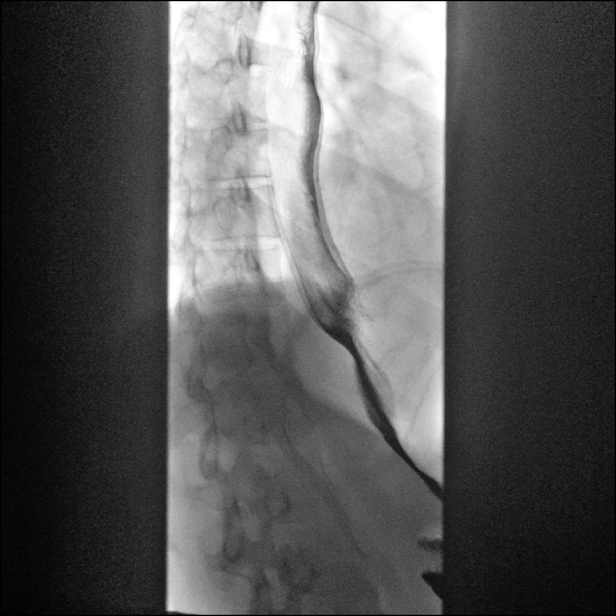
[im 4/4]
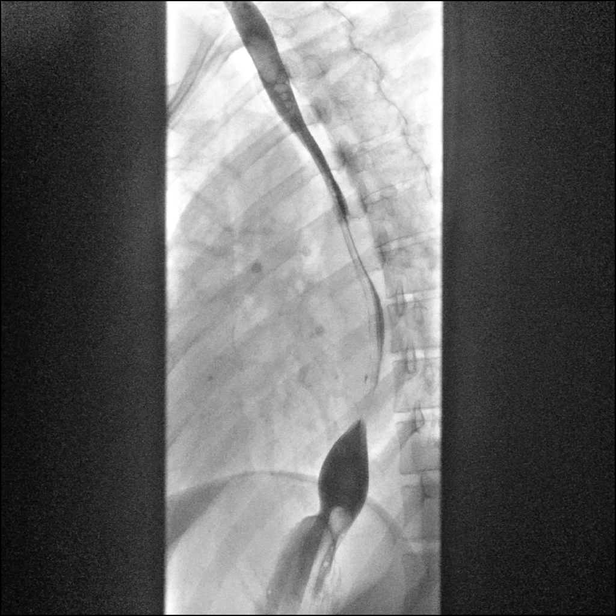

[Series 2: sequence · 4 of 65 frames shown (1 of 3)]
[frame 10/65]
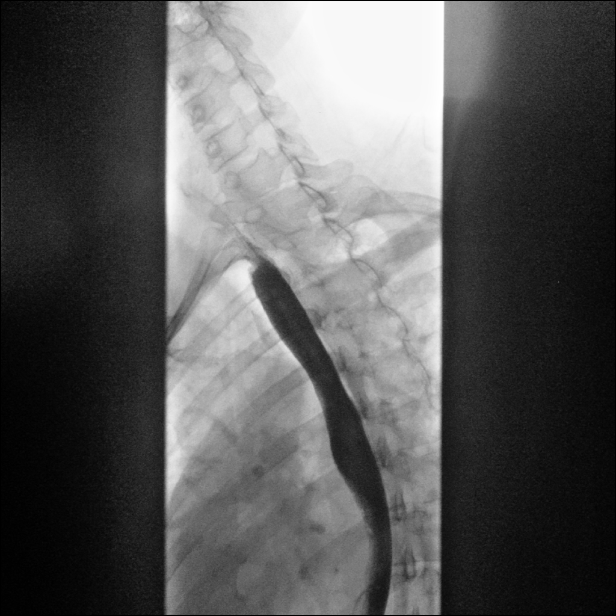
[frame 15/65]
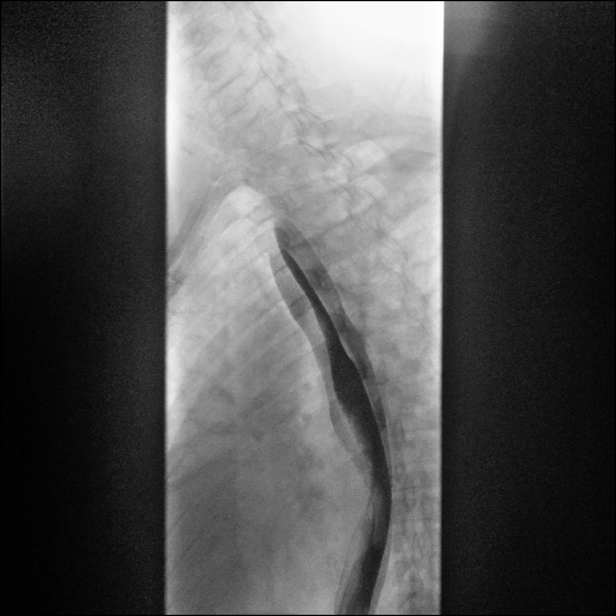
[frame 33/65]
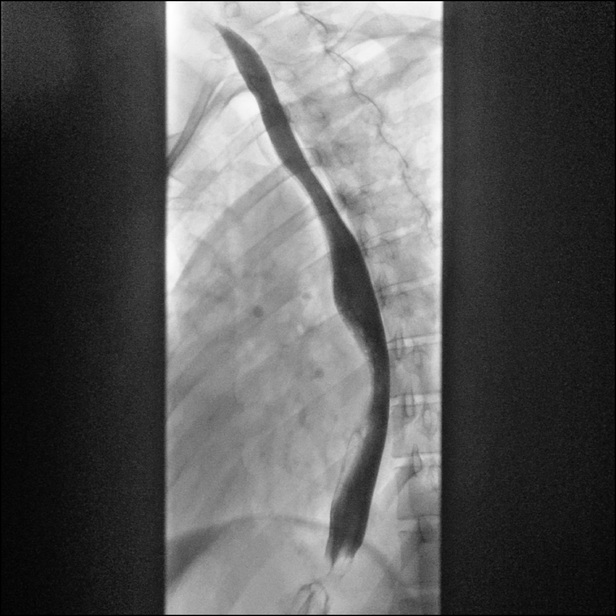
[frame 56/65]
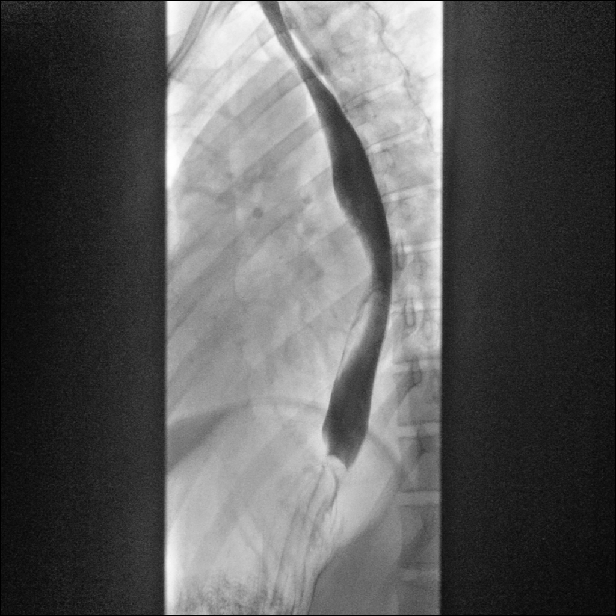

[Series 3: one shot · 1 of 2 slices shown (2 of 2)]
[im 2/2]
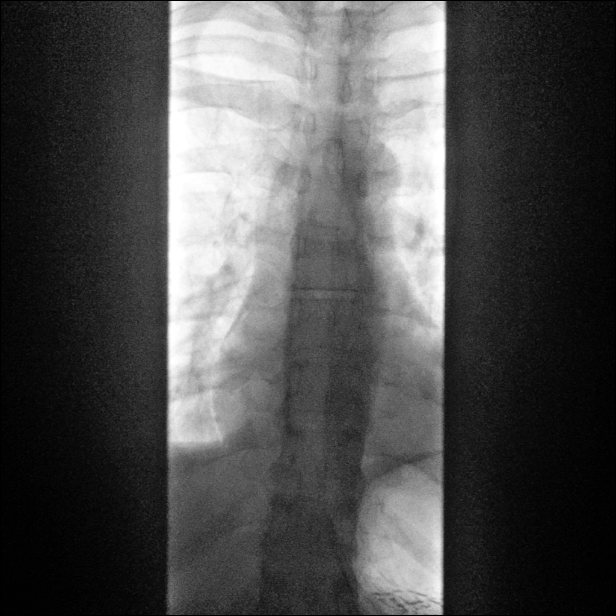

[Series 4: sequence · 3 of 15 frames shown (2 of 3)]
[frame 3/15]
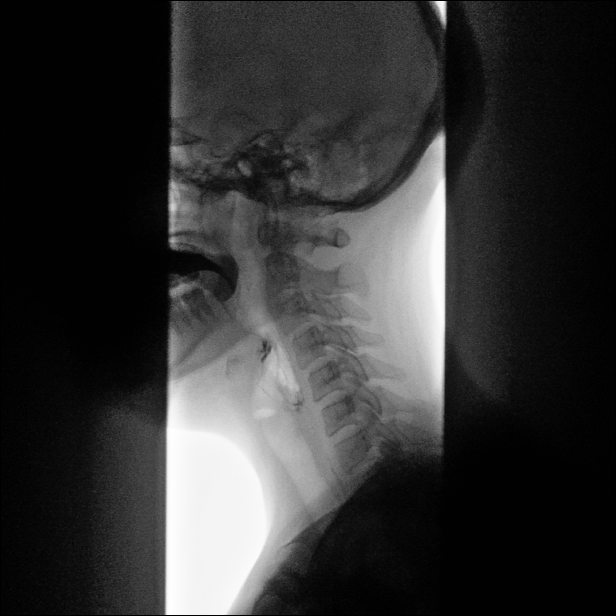
[frame 8/15]
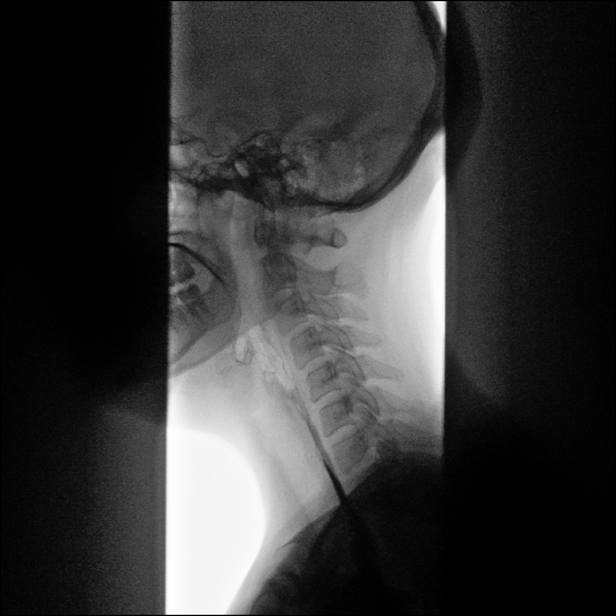
[frame 13/15]
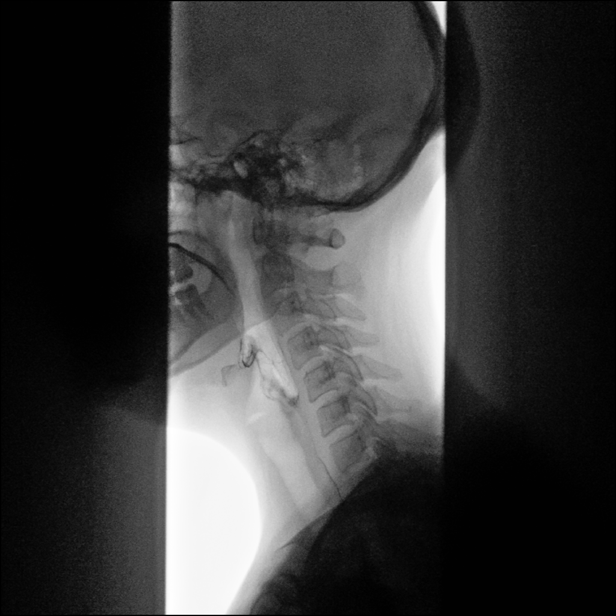

[Series 5: sequence · 3 of 33 frames shown (3 of 3)]
[frame 5/33]
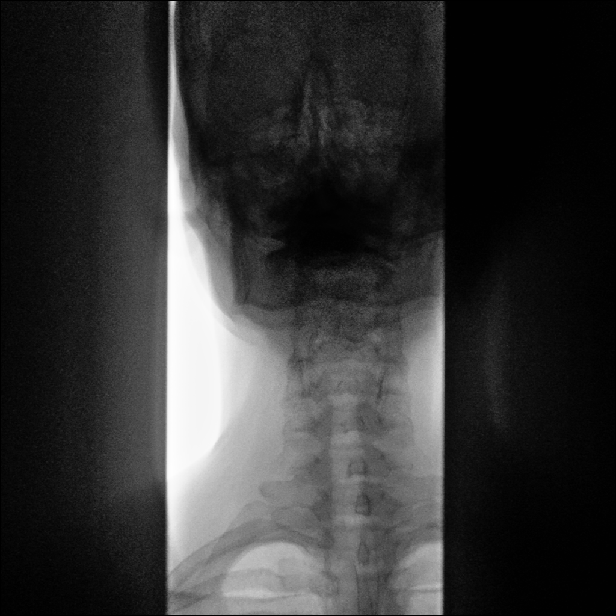
[frame 24/33]
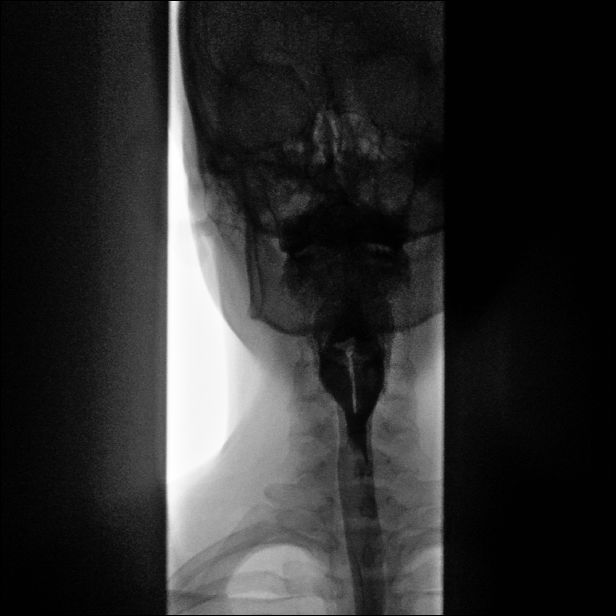
[frame 29/33]
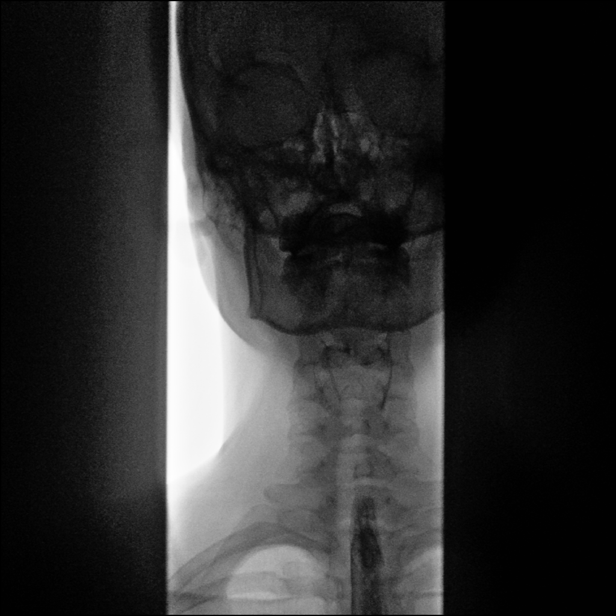

[14 of 17 positions shown; findings below may reference images not displayed]

FINDINGS: The esophagus appears patent. No stricture or mass. Motility of the
esophagus is unremarkable.

A barium tablet was ingested which easily passed through the
esophagus and into the stomach.

No hiatal hernia or reflux noted.

The swallowing mechanism is unremarkable.
IMPRESSION: Normal exam.

## 2020-07-24 ENCOUNTER — Telehealth (INDEPENDENT_AMBULATORY_CARE_PROVIDER_SITE_OTHER): Payer: 59 | Admitting: Adult Health

## 2020-07-24 DIAGNOSIS — F331 Major depressive disorder, recurrent, moderate: Secondary | ICD-10-CM | POA: Diagnosis not present

## 2020-07-24 DIAGNOSIS — F411 Generalized anxiety disorder: Secondary | ICD-10-CM

## 2020-07-24 MED ORDER — BUPROPION HCL ER (XL) 150 MG PO TB24: ORAL_TABLET | ORAL | 2 refills | Status: DC

## 2020-07-24 MED ORDER — SERTRALINE HCL 100 MG PO TABS: ORAL_TABLET | ORAL | 5 refills | Status: DC

## 2020-07-24 NOTE — Progress Notes (Signed)
Roberta [name] [id number] [birth year] 30 y.o.  Virtual Visit via Telephone Note  I connected with pt on 07/24/20 at  4:40 PM EDT by telephone and verified that I am speaking with the correct person using two identifiers.   I discussed the limitations, risks, security and privacy concerns of performing an evaluation and management service by telephone and the availability of in person appointments. I also discussed with the patient that there may be a patient responsible charge related to this service. The patient expressed understanding and agreed to proceed.   I discussed the assessment and treatment plan with the patient. The patient was provided an opportunity to ask questions and all were answered. The patient agreed with the plan and demonstrated an understanding of the instructions.   The patient was advised to call back or seek an in-person evaluation if the symptoms worsen or if the condition fails to improve as anticipated.  I provided 20 minutes of non-face-to-face time during this encounter.  The patient was located at home.  The provider was located at [facility].   Dorothyann Gibbs, NP   Subjective:   Patient ID:  Roberta [name] is a 30 y.o. (DOB 06/13/1990) female.  Chief Complaint: No chief complaint on file.   HPI   Friend on call - helping patient communicate needs.  [name] presents for follow-up of MDD and GAD.  Describes mood today as "not good". Pleasant. Tearful at times. Mood symptoms - reports depression, anxiety - increased, and irritability - "a little". Mood fluctuates. Crying most days - "sad most of the time". Increased medical issues. Can swing from one direction to another quickly - varies with what's going on. Experienced recent losses - Aunt passed away from cancer, Uncle committed suicide and father has been in the hospital. Mood has declined since last visit. Stable interest and motivation. Taking medications as prescribed.  Energy levels  lower - "slug". Active, does not have a regular exercise routine.  Enjoys some usual interests and activities. Single. Lives alone. Family local. Spending time with family. Appetite adequate. Weight stable - 187 pounds. Sleeps well most nights. Averages 9 to 10 hours. Napping on the days she's not working. Focus and concentration stable. Completing tasks. Managing aspects of household. Works full-time. Works as a Human resources officer at [employer]. Denies SI or HI.  Denies AH or VH.   Previous medication trials: Denies  Review of Systems:  Review of Systems  Musculoskeletal: Negative for gait problem.  Neurological: Negative for tremors.  Psychiatric/Behavioral:       Please refer to HPI    Medications: I have reviewed the patient's current medications.  Current Outpatient Medications  Medication Sig Dispense Refill  . buPROPion (WELLBUTRIN XL) 150 MG 24 hr tablet Take one tablet every morning for 7 days, then take two tablets every morning. 60 tablet 2  . atenolol (TENORMIN) 50 MG tablet Take 25 mg by mouth daily.     . cloNIDine (CATAPRES) 0.1 MG tablet Take 5 mg by mouth daily.     Marland Kitchen EPINEPHrine 0.3 mg/0.3 mL IJ SOAJ injection as needed.    . ivabradine (CORLANOR) 5 MG TABS tablet Take 5 mg by mouth 2 (two) times daily with a meal.    . Norethindrone-Ethinyl Estradiol-Fe Biphas (LO LOESTRIN FE) 1 MG-10 MCG / 10 MCG tablet 1 tablet    . promethazine (PHENERGAN) 12.5 MG tablet Take by mouth.    . sertraline (ZOLOFT) 100 MG tablet Take two  tablets daily. 60 tablet 5   No current facility-administered medications for this visit.    Medication Side Effects: None  Allergies:  Allergies  Allergen Reactions  . Shellfish-Derived Products     Other reaction(s): hives, Unknown  . Penicillin G Rash    Other reaction(s): rash    No past medical history on file.  Family History  Problem Relation Age of Onset  . Lymphoma Paternal Grandfather     Social History    Socioeconomic History  . Marital status: Single    Spouse name: Not on file  . Number of children: Not on file  . Years of education: Not on file  . Highest education level: Not on file  Occupational History  . Not on file  Tobacco Use  . Smoking status: Never Smoker  . Smokeless tobacco: Never Used  Substance and Sexual Activity  . Alcohol use: Not on file  . Drug use: Not on file  . Sexual activity: Not on file  Other Topics Concern  . Not on file  Social History Narrative  . Not on file   Social Determinants of Health   Financial Resource Strain: Not on file  Food Insecurity: Not on file  Transportation Needs: Not on file  Physical Activity: Not on file  Stress: Not on file  Social Connections: Not on file  Intimate Partner Violence: Not on file    Past Medical History, Surgical history, Social history, and Family history were reviewed and updated as appropriate.   Please see review of systems for further details on the patient's review from today.   Objective:   Physical Exam:  There were no vitals taken for this visit.  Physical Exam Constitutional:      General: She is not in acute distress. Musculoskeletal:        General: No deformity.  Neurological:     Mental Status: She is alert and oriented to person, place, and time.     Coordination: Coordination normal.  Psychiatric:        Attention and Perception: Attention and perception normal. She does not perceive auditory or visual hallucinations.        Mood and Affect: Mood normal. Mood is not anxious or depressed. Affect is not labile, blunt, angry or inappropriate.        Speech: Speech normal.        Behavior: Behavior normal.        Thought Content: Thought content normal. Thought content is not paranoid or delusional. Thought content does not include homicidal or suicidal ideation. Thought content does not include homicidal or suicidal plan.        Cognition and Memory: Cognition and memory  normal.        Judgment: Judgment normal.     Comments: Insight intact     Lab Review:     Component Value Date/Time   NA 139 03/24/2011 0910   K 3.7 03/24/2011 0910   CL 104 03/24/2011 0910   CO2 24 03/24/2011 0910   GLUCOSE 83 03/24/2011 0910   BUN 10 03/24/2011 0910   CREATININE 0.69 03/24/2011 0910   CALCIUM 9.5 03/24/2011 0910   PROT 7.4 03/24/2011 0910   ALBUMIN 5.1 03/24/2011 0910   AST 25 03/24/2011 0910   ALT 20 03/24/2011 0910   ALKPHOS 38 (L) 03/24/2011 0910   BILITOT 1.0 03/24/2011 0910       Component Value Date/Time   WBC 6.6 03/24/2011 0910   RBC 5.16 03/24/2011 0910  HGB 15.6 03/24/2011 0910   HCT 43.0 03/24/2011 0910   PLT 254 03/24/2011 0910   MCV 83.3 03/24/2011 0910   MCH 30.2 03/24/2011 0910   MCHC 36.3 (H) 03/24/2011 0910   RDW 12.6 03/24/2011 0910   LYMPHSABS 2.7 03/24/2011 0910   MONOABS 0.4 03/24/2011 0910   EOSABS 0.2 03/24/2011 0910   BASOSABS 0.0 03/24/2011 0910    No results found for: POCLITH, LITHIUM   No results found for: PHENYTOIN, PHENOBARB, VALPROATE, CBMZ   .res Assessment: Plan:     Plan:  PDMP reviewed  1. Decrease Zoloft 200mg  to 150mg  daily x 7 days, then continue at 100mg  daily  2. Add Wellbutrin XL 150mg  daily x 7 days, then take two tablets every morning. Denies any seizure history.  RTC 4 weeks  Patient advised to contact office with any questions, adverse effects, or acute worsening in signs and symptoms.   Diagnoses and all orders for this visit:  Generalized anxiety disorder -     sertraline (ZOLOFT) 100 MG tablet; Take two tablets daily. -     buPROPion (WELLBUTRIN XL) 150 MG 24 hr tablet; Take one tablet every morning for 7 days, then take two tablets every morning.  Major depressive disorder, recurrent episode, moderate (HCC) -     sertraline (ZOLOFT) 100 MG tablet; Take two tablets daily. -     buPROPion (WELLBUTRIN XL) 150 MG 24 hr tablet; Take one tablet every morning for 7 days, then take two  tablets every morning.    Please see After Visit Summary for patient specific instructions.  No future appointments.  No orders of the defined types were placed in this encounter.     -------------------------------

## 2020-07-31 ENCOUNTER — Telehealth: Payer: Self-pay | Admitting: Adult Health

## 2020-07-31 NOTE — Telephone Encounter (Signed)
Please review

## 2020-07-31 NOTE — Telephone Encounter (Signed)
Roberta [name] called in today with concerns about medication change. She is weaning off Zoloft 200mg  and now taking 100mg  as well as taking Wellbutrin 100mg  to be increased in 7 days to 300mg . She states that since starting the Wellbutrin she has been jittery,can't sit still and feel like she is losing her mind. Just overall "not feeling great. She would like to know is this normal with the medication change. Pls RTC [phone]

## 2020-07-31 NOTE — Telephone Encounter (Signed)
Can you follow up with her? 

## 2020-08-01 ENCOUNTER — Other Ambulatory Visit: Payer: Self-pay

## 2020-08-01 MED ORDER — PROPRANOLOL HCL 10 MG PO TABS: ORAL_TABLET | ORAL | 0 refills | Status: DC

## 2020-08-01 NOTE — Telephone Encounter (Signed)
Mom, Roberta [name] called back with up to date information. She did report Roberta [name] won't answer or talk to anyone on the phone but she will let her Mom know someone called.    Pt started weaning Zoloft this past weekend, she is currently taking 150 mg and also taking Wellbutrin 150 mg. Pt reports she is losing her mind, Mom asked me to tell Roberta [name] "it's bad". Also pt's grandfather is in the hospital dying so that has sent pt over the edge. What can they do to stabilize her quickly at least for the time being, especially because of her grandfather.    Increase Zoloft? Take off Wellbutrin?            Need to add Roberta [name] on her information. Informed her I would check with front office staff on that information. [phone]

## 2020-08-01 NOTE — Telephone Encounter (Signed)
LM for pt to call back and discuss.

## 2020-08-01 NOTE — Telephone Encounter (Signed)
Rtc back to Mom and advised pt to stop Wellbutrin and increase Zoloft to 200 mg daily. Also discussed something for anxiety. Rx for propranolol submitted to [facility]. Pt does not want anything habit forming.   Informed Mom it didn't mean that pt would normally have this reaction with a medication change but the added stressors may have exacerbated her condition.

## 2020-08-01 NOTE — Telephone Encounter (Signed)
Noted  

## 2020-08-03 NOTE — Telephone Encounter (Signed)
Roberta [name]'s Mom called and left a VM on Sunday evening on nurses line that the propranolol that was sent for her anxiety won't work for her, she is already on a beta blocker for her cardiac issues and takes 80 mg which is ineffective for her anxiety. Mom asking if something else can be sent instead. Hydroxyzine? Also pt continued with the transition of Sertraline and Wellbutrin, she is taking 300 mg Wellbutrin and Sertraline is 100 mg. Mom reports it seems to be going well. Mom will be unavailable until after 11 am on Monday if needing to discuss anything. She will be on a flight back from [place].

## 2020-08-04 ENCOUNTER — Other Ambulatory Visit: Payer: Self-pay

## 2020-08-04 MED ORDER — HYDROXYZINE PAMOATE 25 MG PO CAPS: 25.0000 mg | ORAL_CAPSULE | Freq: Three times a day (TID) | ORAL | 0 refills | Status: DC | PRN

## 2020-08-04 NOTE — Telephone Encounter (Signed)
Can add Hydroxyzine.

## 2020-08-04 NOTE — Telephone Encounter (Signed)
Rx sent to [facility] for Hydroxyzine.

## 2020-08-14 ENCOUNTER — Encounter: Payer: Self-pay | Admitting: Physical Therapy

## 2020-08-14 ENCOUNTER — Other Ambulatory Visit: Payer: Self-pay

## 2020-08-14 ENCOUNTER — Ambulatory Visit: Payer: Managed Care, Other (non HMO) | Attending: Cardiology | Admitting: Physical Therapy

## 2020-08-14 VITALS — BP 123/81 | HR 72

## 2020-08-14 DIAGNOSIS — M6281 Muscle weakness (generalized): Secondary | ICD-10-CM | POA: Diagnosis present

## 2020-08-14 DIAGNOSIS — M357 Hypermobility syndrome: Secondary | ICD-10-CM

## 2020-08-14 DIAGNOSIS — R42 Dizziness and giddiness: Secondary | ICD-10-CM

## 2020-08-14 DIAGNOSIS — I498 Other specified cardiac arrhythmias: Secondary | ICD-10-CM | POA: Diagnosis present

## 2020-08-14 DIAGNOSIS — G90A Postural orthostatic tachycardia syndrome (POTS): Secondary | ICD-10-CM

## 2020-08-14 NOTE — Therapy (Signed)
[facility] Outpatient Rehabilitation [facility] [address] [place], [place], [address] Phone: [phone]   Fax:  [phone]  Physical Therapy Evaluation  Patient Details  Name: Roberta [name] MRN: [id number] Date of Birth: 26-May-1990 Referring Provider (PT): Dr. Christie Beckers , Dr Merri Brunette   Encounter Date: 08/14/2020   PT End of Session - 08/14/20 0937    Visit Number 1    Number of Visits 16    Date for PT Re-Evaluation 10/09/20    Authorization Type [employer]    PT Start Time 0826    PT Stop Time 0916    PT Time Calculation (min) 50 min    Activity Tolerance Patient tolerated treatment well    Behavior During Therapy [facility] for tasks assessed/performed           History reviewed. No pertinent past medical history.  History reviewed. No pertinent surgical history.  Vitals:   08/14/20 0840  BP: 123/81  Pulse: 72  SpO2: 99%      Subjective Assessment - 08/14/20 0832    Subjective Pt returns after several months off from PT.  She was hospitalized x 2 for dehydration and GI disorder. I cant go up any stairs anymore.  I get short of breath with any distances.  She cont to have fatigue, nausea, dizziness, weakness, knees hurt. My back hurt when I was in the hospital.  Pt is still working but really struggling to get through the day.    Pertinent History POTS, GI issues, EDS type III    Limitations Lifting;Standing;Walking;House hold activities    How long can you stand comfortably? 5 min    How long can you walk comfortably? < 10 min , fatigue limits    Patient Stated Goals Patientr would like to move and not be so out of breath    Currently in Pain? No/denies              [facility] PT Assessment - 08/14/20 0001      Assessment   Medical Diagnosis [name] , arrhythmia    Referring Provider (PT) Dr. Christie Beckers , Dr Merri Brunette    Onset Date/Surgical Date --   chronic with hospitalization 4/3, 07/01/20   Hand Dominance Right    Next MD Visit  2-4 weeks    Prior Therapy Yes      Precautions   Precautions None    Precaution Comments monitor symptoms of dizziness, Sa O2, BP      Restrictions   Weight Bearing Restrictions No      Balance Screen   Has the patient fallen in the past 6 months No    Has the patient had a decrease in activity level because of a fear of falling?  No   not due ot balance   Is the patient reluctant to leave their home because of a fear of falling?  No      Home Environment   Living Environment Private residence    Living Arrangements Alone    Type of Home Apartment      Prior Function   Level of Independence Independent    Vocation Full time employment    Vocation Requirements walking, standing    Leisure limited right now      Cognition   Overall Cognitive Status Within Functional Limits for tasks assessed      Observation/Other Assessments   Focus on Therapeutic Outcomes (FOTO)  49%      Sensation   Light  Touch Appears Intact      Coordination   Gross Motor Movements are Fluid and Coordinated Not tested      Posture/Postural Control   Posture/Postural Control Postural limitations    Postural Limitations Rounded Shoulders;Forward head    Posture Comments sway back      PROM   Overall PROM Comments WFL in hips      Strength   Overall Strength Comments 4/5 knees with muscle fatigue in legs, UE grossly    Right Shoulder Flexion 4-/5    Right Shoulder Extension 4/5    Left Shoulder Flexion 4-/5    Left Shoulder Extension 4/5    Right Hip Extension 3+/5    Right Hip ABduction 4/5    Left Hip Extension 3+/5    Left Hip ABduction 4-/5      Ambulation/Gait   Ambulation Distance (Feet) 1272 Feet      6 Minute Walk- Baseline   BP (mmHg) 132/84    HR (bpm) 72    02 Sat (%RA) 99 %    Perceived Rate of Exertion (Borg) 14-      6 minute walk test results    Aerobic Endurance Distance Walked 1272    Endurance additional comments increased drift to Rt , sway back and pelvic  instabilty increases with fatigue             Objective measurements completed on examination: See above findings.       [facility] Adult PT Treatment/Exercise - 08/14/20 0001      Knee/Hip Exercises: Supine   Bridges Strengthening;Both;1 set;10 reps      Knee/Hip Exercises: Sidelying   Hip ABduction 1 set;10 reps    Hip ADduction 1 set;10 reps                  PT Education - 08/14/20 0933    Education Details PT/POC, HEP, goals    Person(s) Educated Patient    Methods Explanation;Demonstration;Handout    Comprehension Verbalized understanding;Returned demonstration            PT Short Term Goals - 08/14/20 0937      PT SHORT TERM GOAL #1   Title Pt will be consistent and I with HEP for core, large muscle group strengthening    Time 4    Period Weeks    Status New    Target Date 09/11/20      PT SHORT TERM GOAL #2   Title Pt will use RPE and HR monitor (Fitbit) to monitor self while doing cardio    Time 4    Period Weeks    Status New    Target Date 09/11/20      PT SHORT TERM GOAL #3   Title Pt will be able to report feeling less fatigue post work, able to do simple home tasks and not have to go right to bed.    Time 4    Period Weeks    Status New    Target Date 09/11/20             PT Long Term Goals - 08/14/20 0941      PT LONG TERM GOAL #1   Title Pt will be able to show Independence with HEP and consistent cardio routine    Time 8    Period Weeks    Status On-going    Target Date 10/09/20      PT LONG TERM GOAL #2   Title Patient will be able  to squat, lift up to 25 lbs x 10 with no pain, just min fatigue    Time 8    Period Weeks    Status New    Target Date 10/09/20      PT LONG TERM GOAL #3   Title Pt will be able to see a NICU patient for 30 min in standing with min difficulty    Time 8    Period Weeks    Status New    Target Date 10/09/20      PT LONG TERM GOAL #4   Title Pt will be able to show 5/5 strength in core  and LEs    Time 8    Period Weeks    Status New    Target Date 10/09/20                  Plan - 08/14/20 0946    Clinical Impression Statement Patient presents for low complexity eval of Ehlers Danlos and POTS/dysautonomia following  hospitalization.  She experienced severe GI symptoms, nausea and fatigue, and a decline in physical function. She continues  to work and has significant difficulty with standing, walking, climbiing stairs.  Her vital signs are stable today.  She shows decent strength but lacks muscular endurance for sustaining a contraction.  Lumbopelvic region becomes less stable and controlled as fatigue  sets in with walking/standing.   She will benefit from safe, monitored exercise to increae her stamina and funcitonal mobility to allow for return to community activity and work.    Personal Factors and Comorbidities Comorbidity 3+;Other    Comorbidities chronic conditon, EDS hypermobility , POTS, GI    Examination-Activity Limitations Lift;Stairs;Bend;Locomotion Level;Stand;Caring for Others;Reach Overhead;Carry    Examination-Participation Restrictions Cleaning;Shop;Community Activity;Occupation;Interpersonal Relationship    Stability/Clinical Decision Making Stable/Uncomplicated    Clinical Decision Making Moderate    Rehab Potential Good    PT Frequency 2x / week    PT Duration 8 weeks    PT Treatment/Interventions ADLs/Self Care Home Management;Moist Heat;Neuromuscular re-education;Balance training;Therapeutic exercise;Therapeutic activities;Manual techniques;Taping    PT Next Visit Plan endurance, mat level strength, posture. monitor sx    PT Home Exercise Plan 4 way hip, bridging, lower abs    Consulted and Agree with Plan of Care Patient           Patient will benefit from skilled therapeutic intervention in order to improve the following deficits and impairments:  Decreased balance,Decreased endurance,Decreased mobility,Difficulty walking,Postural  dysfunction,Pain,Decreased strength,Decreased activity tolerance,Cardiopulmonary status limiting activity,Dizziness  Visit Diagnosis: Dizziness and giddiness  Hypermobility syndrome  POTS (postural orthostatic tachycardia syndrome)  Muscle weakness (generalized)     Problem List Patient Active Problem List   Diagnosis Date Noted  . Dry heaves 03/28/2020  . 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 Nausea and vomiting 03/28/2020  . Ehlers-Danlos disease 11/22/2019  . Chronic eczematous otitis externa of both ears 12/07/2018  . Excessive cerumen in both ear canals 12/07/2018  . POTS (postural orthostatic tachycardia syndrome) 11/04/2014  . Benign essential hypertension 10/10/2014  . Lymphadenopathy 02/10/2011    Roberta [name] 08/14/2020, 7:54 PM  [facility] [address] [place], [place], [address] Phone: [phone]   Fax:  [phone]  Name: Roberta [name] MRN: [id number] Date of Birth: Sep 06, 1990   Karie Mainland, PT 08/14/20 7:54 PM Phone: [phone] Fax: [phone]

## 2020-08-14 NOTE — Patient Instructions (Signed)
Access Code: [email or web address] URL: [email or web address] Date: 08/14/2020 Prepared by: Karie Mainland  Exercises Supine 90/90 with Leg Extensions - 1 x daily - 7 x weekly - 2 sets - 10 reps - 5 hold Supine Bridge - 1 x daily - 7 x weekly - 2 sets - 10 reps - 5 hold Active Straight Leg Raise with Quad Set - 1 x daily - 7 x weekly - 2 sets - 10 reps - 5 hold Prone Hip Extension - 1 x daily - 7 x weekly - 2 sets - 10 reps - 5 hold Sidelying Hip Abduction - 1 x daily - 7 x weekly - 2 sets - 10 reps - 5 hold Sidelying Hip Adduction - 1 x daily - 7 x weekly - 2 sets - 10 reps - 5 hold

## 2020-08-20 ENCOUNTER — Telehealth: Payer: Self-pay

## 2020-08-20 NOTE — Telephone Encounter (Signed)
Melissa, Mom called and reports pt is having bad anxiety. Rx for Hydroxyzine was sent in 08/04/20 and she reports it initially helped but now its not. Mom mentioned you discussed increasing one of her medications? She has apt on 09/04/20.

## 2020-08-21 ENCOUNTER — Other Ambulatory Visit: Payer: Self-pay

## 2020-08-21 ENCOUNTER — Ambulatory Visit: Payer: Managed Care, Other (non HMO) | Admitting: Physical Therapy

## 2020-08-21 MED ORDER — BUSPIRONE HCL 10 MG PO TABS: 10.0000 mg | ORAL_TABLET | Freq: Two times a day (BID) | ORAL | 0 refills | Status: DC

## 2020-08-21 NOTE — Telephone Encounter (Signed)
Rtc to [place], Mom and she reports pt is only taking at hs because during the day it was too sedating to function. Now at night the 25 mg is not helping at all. Mom reports pt's Grandfather did pass away and pt goes to [place] multiple times a day when she is off, obviously having a hard time. Mom feels if the anxiety is better controlled that pt will function better. Mom reports she takes Buspar and wants to know if that is an option for Charlie?   Also asking if they can increase her Wellbutrin XL to 450 mg now before upcoming apt on 6/16?  Informed Mom I would update Rene Kocher and ask both questions and f/u

## 2020-08-21 NOTE — Telephone Encounter (Signed)
We can try the Buspar 10mg  BID to start.

## 2020-08-21 NOTE — Telephone Encounter (Signed)
Mom, Melissa notified and Rx sent to [facility]

## 2020-08-21 NOTE — Telephone Encounter (Signed)
How is she taking the Hydroxyzine?

## 2020-08-28 ENCOUNTER — Ambulatory Visit: Payer: Managed Care, Other (non HMO) | Attending: Cardiology | Admitting: Physical Therapy

## 2020-08-28 ENCOUNTER — Other Ambulatory Visit: Payer: Self-pay

## 2020-08-28 ENCOUNTER — Encounter: Payer: Self-pay | Admitting: Physical Therapy

## 2020-08-28 VITALS — HR 117

## 2020-08-28 DIAGNOSIS — I498 Other specified cardiac arrhythmias: Secondary | ICD-10-CM | POA: Diagnosis present

## 2020-08-28 DIAGNOSIS — M357 Hypermobility syndrome: Secondary | ICD-10-CM | POA: Diagnosis present

## 2020-08-28 DIAGNOSIS — G90A Postural orthostatic tachycardia syndrome (POTS): Secondary | ICD-10-CM

## 2020-08-28 DIAGNOSIS — M6281 Muscle weakness (generalized): Secondary | ICD-10-CM | POA: Diagnosis present

## 2020-08-28 DIAGNOSIS — R42 Dizziness and giddiness: Secondary | ICD-10-CM | POA: Diagnosis not present

## 2020-08-28 NOTE — Therapy (Signed)
[facility] Out[facility] Rehabilitation [facility] [address] [place], [place], [address] Phone: [phone]   Fax:  [phone]  Physical Therapy Treatment  Patient Details  Name: Roberta [name] MRN: [id number] Date of Birth: 1991-02-01 Referring Provider (PT): Dr. Christie Beckers , Dr Merri Brunette   Encounter Date: 08/28/2020   PT End of Session - 08/28/20 0754     Visit Number 2    Number of Visits 16    Date for PT Re-Evaluation 10/09/20    Authorization Type [employer]    PT Start Time 0745    PT Stop Time 0830    PT Time Calculation (min) 45 min    Activity Tolerance Patient limited by pain;Patient limited by fatigue    Behavior During Therapy [facility] for tasks assessed/performed             History reviewed. No pertinent past medical history.  History reviewed. No pertinent surgical history.  Vitals:   08/28/20 0754  Pulse: (!) 117  SpO2: 97%     Subjective Assessment - 08/28/20 0748     Subjective It has been rough.  My labs are off (BUN, Creatinine).  Makes her LE very painful with any movement and even at rest.  I am getting an infusion after this and I see the cardiologist.  Still working .  Pain at rest 5/10.    Currently in Pain? Yes    Pain Score 5     Pain Location Generalized    Pain Orientation Other (Comment)   lower body   Pain Descriptors / Indicators Aching;Sore;Tiring    Pain Type Acute pain    Pain Radiating Towards back and lower body to feet    Pain Onset More than a month ago    Pain Frequency Constant    Aggravating Factors  squatting    Pain Relieving Factors nothing    Effect of Pain on Daily Activities everything hurts.  Work.  I go home and go right to sleep.                [facility] Adult PT Treatment/Exercise - 08/28/20 0001       Shoulder Exercises: Supine   Horizontal ABduction Strengthening;Both;15 reps    Theraband Level (Shoulder Horizontal ABduction) Level 2 (Red)    Horizontal ABduction Weight (lbs) and then 3  lbs    External Rotation Strengthening;Both;15 reps    Theraband Level (Shoulder External Rotation) Level 2 (Red)    Flexion Limitations overhead lift ith circle iso adduction then abd x 10 each    Diagonals Both;10 reps    Theraband Level (Shoulder Diagonals) Level 2 (Red)    Other Supine Exercises chest press 3 lbs x 10 , triceps x 15 , 3 lbs      Shoulder Exercises: Standing   Protraction Weight (lbs) retract green x 15    Extension Strengthening;Both;15 reps;Theraband    Theraband Level (Shoulder Extension) Level 3 (Green)    Row Strengthening;Both;15 reps;Theraband    Theraband Level (Shoulder Row) Level 3 (Green)      Shoulder Exercises: Therapy Ball   Other Therapy Ball Exercises seated core red band unilateral flexion and lateral rasie x 10 each , knee lift lower abs x 10      Shoulder Exercises: ROM/Strengthening   UBE (Upper Arm Bike) 5 min retro L1                      PT Short Term Goals - 08/14/20  6644       PT SHORT TERM GOAL #1   Title Pt will be consistent and I with HEP for core, large muscle group strengthening    Time 4    Period Weeks    Status New    Target Date 09/11/20      PT SHORT TERM GOAL #2   Title Pt will use RPE and HR monitor (Fitbit) to monitor self while doing cardio    Time 4    Period Weeks    Status New    Target Date 09/11/20      PT SHORT TERM GOAL #3   Title Pt will be able to report feeling less fatigue post work, able to do simple home tasks and not have to go right to bed.    Time 4    Period Weeks    Status New    Target Date 09/11/20               PT Long Term Goals - 08/14/20 0941       PT LONG TERM GOAL #1   Title Pt will be able to show Independence with HEP and consistent cardio routine    Time 8    Period Weeks    Status On-going    Target Date 10/09/20      PT LONG TERM GOAL #2   Title Patient will be able to squat, lift up to 25 lbs x 10 with no pain, just min fatigue    Time 8    Period  Weeks    Status New    Target Date 10/09/20      PT LONG TERM GOAL #3   Title Pt will be able to see a NICU patient for 30 min in standing with min difficulty    Time 8    Period Weeks    Status New    Target Date 10/09/20      PT LONG TERM GOAL #4   Title Pt will be able to show 5/5 strength in core and LEs    Time 8    Period Weeks    Status New    Target Date 10/09/20                   Plan - 08/28/20 0808     Clinical Impression Statement Patient with multiple medical issues at this time impacting her ability to work, do ADLs and even be upright when he is home. We focused on upper body exercise and core and it was well tolerated .  She is receiving an infusion today and seeing Cardilogist for further workup.  Will cont POC as tolerated and assist in maintaining current level of function.    PT Treatment/Interventions ADLs/Self Care Home Management;Moist Heat;Neuromuscular re-education;Balance training;Therapeutic exercise;Therapeutic activities;Manual techniques;Taping    PT Next Visit Plan endurance, mat level strength, posture. monitor sx    PT Home Exercise Plan Access Code: [email or web address]  URL: [email or web address]  Date: 08/28/2020  Prepared by: Karie Mainland    Exercises  Supine 90/90 with Leg Extensions - 1 x daily - 7 x weekly - 2 sets - 10 reps - 5 hold  Supine Bridge - 1 x daily - 7 x weekly - 2 sets - 10 reps - 5 hold  Active Straight Leg Raise with Quad Set - 1 x daily - 7 x weekly - 2 sets - 10 reps - 5 hold  Prone Hip Extension - 1 x daily -  7 x weekly - 2 sets - 10 reps - 5 hold  Sidelying Hip Abduction - 1 x daily - 7 x weekly - 2 sets - 10 reps - 5 hold  Sidelying Hip Adduction - 1 x daily - 7 x weekly - 2 sets - 10 reps - 5 hold  Supine Shoulder Horizontal Abduction with Resistance - 1 x daily - 7 x weekly - 2 sets - 10 reps  Seated Shoulder Front Raise with Resistance - 1 x daily - 7 x weekly - 2 sets - 10 reps    Consulted and Agree with Plan of  Care Patient             Patient will benefit from skilled therapeutic intervention in order to improve the following deficits and impairments:  Decreased balance, Decreased endurance, Decreased mobility, Difficulty walking, Postural dysfunction, Pain, Decreased strength, Decreased activity tolerance, Cardiopulmonary status limiting activity, Dizziness  Visit Diagnosis: Dizziness and giddiness  POTS (postural orthostatic tachycardia syndrome)  Hypermobility syndrome  Muscle weakness (generalized)     Problem List Patient Active Problem List   Diagnosis Date Noted   Dry heaves 03/28/2020   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 [place], [place], [address] Phone: [phone]   Fax:  [phone]  Name: Roberta [name] MRN: [id number] Date of Birth: 1990-04-22  Karie Mainland, PT 08/28/20 8:43 AM Phone: [phone] Fax: [phone]

## 2020-09-04 ENCOUNTER — Telehealth (INDEPENDENT_AMBULATORY_CARE_PROVIDER_SITE_OTHER): Payer: 59 | Admitting: Adult Health

## 2020-09-04 DIAGNOSIS — F411 Generalized anxiety disorder: Secondary | ICD-10-CM

## 2020-09-04 DIAGNOSIS — F331 Major depressive disorder, recurrent, moderate: Secondary | ICD-10-CM

## 2020-09-04 NOTE — Progress Notes (Signed)
Roberta [name] [id number] 1991/01/13 30 y.o.  Virtual Visit via Telephone Note  I connected with pt on 09/05/20 at  5:00 PM EDT by telephone and verified that I am speaking with the correct person using two identifiers.   I discussed the limitations, risks, security and privacy concerns of performing an evaluation and management service by telephone and the availability of in person appointments. I also discussed with the patient that there may be a patient responsible charge related to this service. The patient expressed understanding and agreed to proceed.   I discussed the assessment and treatment plan with the patient. The patient was provided an opportunity to ask questions and all were answered. The patient agreed with the plan and demonstrated an understanding of the instructions.   The patient was advised to call back or seek an in-person evaluation if the symptoms worsen or if the condition fails to improve as anticipated.  I provided 30 minutes of non-face-to-face time during this encounter.  The patient was located at home.  The provider was located at [facility].   Roberta Gibbs, NP   Subjective:   Patient ID:  Roberta [name] is a 30 y.o. (DOB 1990/11/23) female.  Chief Complaint: No chief complaint on file.  HPI  Roberta [name] presents for follow-up of MDD and GAD.  Describes mood today as "not any better". Pleasant. Tearful frequently. Mood symptoms - reports depression, anxiety, and irritability. Symptoms worsening since loss of grandfather. Started Wellbutrin and feels it has made her "angry and snappy with people" - would like to discontinue. Is willing to switch to Pristiq to see if it is helpful. Difficulties managing mood with multiple losses. Aunt passed away from cancer, Uncle committed suicide and most recently grandfather passed away. Having difficulties in the work setting and overall day to day functioning. Working with PCP for medical  issues. Decreased interest and motivation. Taking medications as prescribed.  Energy levels lower. Active, does not have a regular exercise routine.  Enjoys some usual interests and activities. Single. Lives alone. Family local. Spending time with family. Appetite adequate. Weight stable - 187 pounds. Sleeps well most nights. Averages 9 to 10 hours. Napping on days she's not working. Focus and concentration stable. Completing tasks. Managing aspects of household. Works full-time. Works as a Human resources officer at [employer]. Denies SI or HI.  Denies AH or VH.   Previous medication trials: Wellbutrin - irritability.   Review of Systems:  Review of Systems  Musculoskeletal:  Negative for gait problem.  Neurological:  Negative for tremors.  Psychiatric/Behavioral:         Please refer to HPI   Medications: I have reviewed the patient's current medications.  Current Outpatient Medications  Medication Sig Dispense Refill   desvenlafaxine (PRISTIQ) 50 MG 24 hr tablet Take 1 tablet (50 mg total) by mouth daily. 30 tablet 2   atenolol (TENORMIN) 50 MG tablet Take 25 mg by mouth daily.  (Patient not taking: Reported on 08/14/2020)     busPIRone (BUSPAR) 10 MG tablet Take 1 tablet (10 mg total) by mouth 2 (two) times daily. 60 tablet 0   cloNIDine (CATAPRES) 0.1 MG tablet Take 5 mg by mouth daily.      EPINEPHrine 0.3 mg/0.3 mL IJ SOAJ injection as needed.     hydrOXYzine (VISTARIL) 25 MG capsule Take 1 capsule (25 mg total) by mouth 3 (three) times daily as needed for anxiety. 30 capsule 0   ivabradine (CORLANOR) 5 MG  TABS tablet Take 5 mg by mouth 2 (two) times daily with a meal.     metoprolol succinate (TOPROL-XL) 50 MG 24 hr tablet Take 50 mg by mouth daily. Take with or immediately following a meal.     Norethindrone-Ethinyl Estradiol-Fe Biphas (LO LOESTRIN FE) 1 MG-10 MCG / 10 MCG tablet 1 tablet     octreotide (SANDOSTATIN LAR) 20 MG injection Inject 20 mg into the muscle every  28 (twenty-eight) days. Takes 3 x day before meals     promethazine (PHENERGAN) 12.5 MG tablet Take by mouth.     sertraline (ZOLOFT) 100 MG tablet Take two tablets daily. 60 tablet 5   No current facility-administered medications for this visit.    Medication Side Effects: None  Allergies:  Allergies  Allergen Reactions   Shellfish Allergy Anaphylaxis, Hives and Other (See Comments)   Shellfish-Derived Products     Other reaction(s): hives, Unknown   Penicillin G Rash    Other reaction(s): rash    No past medical history on file.  Family History  Problem Relation Age of Onset   Lymphoma Paternal Grandfather     Social History   Socioeconomic History   Marital status: Single    Spouse name: Not on file   Number of children: Not on file   Years of education: Not on file   Highest education level: Not on file  Occupational History   Not on file  Tobacco Use   Smoking status: Never   Smokeless tobacco: Never  Substance and Sexual Activity   Alcohol use: Not on file   Drug use: Not on file   Sexual activity: Not on file  Other Topics Concern   Not on file  Social History Narrative   Not on file   Social Determinants of Health   Financial Resource Strain: Not on file  Food Insecurity: Not on file  Transportation Needs: Not on file  Physical Activity: Not on file  Stress: Not on file  Social Connections: Not on file  Intimate Partner Violence: Not on file    Past Medical History, Surgical history, Social history, and Family history were reviewed and updated as appropriate.   Please see review of systems for further details on the patient's review from today.   Objective:   Physical Exam:  There were no vitals taken for this visit.  Physical Exam Neurological:     Mental Status: She is alert and oriented to person, place, and time.     Cranial Nerves: No dysarthria.  Psychiatric:        Attention and Perception: Attention and perception normal.         Mood and Affect: Mood normal.        Speech: Speech normal.        Behavior: Behavior is cooperative.        Thought Content: Thought content normal. Thought content is not paranoid or delusional. Thought content does not include homicidal or suicidal ideation. Thought content does not include homicidal or suicidal plan.        Cognition and Memory: Cognition and memory normal.        Judgment: Judgment normal.     Comments: Insight intact    Lab Review:     Component Value Date/Time   NA 139 03/24/2011 0910   K 3.7 03/24/2011 0910   CL 104 03/24/2011 0910   CO2 24 03/24/2011 0910   GLUCOSE 83 03/24/2011 0910   BUN 10 03/24/2011 0910  CREATININE 0.69 03/24/2011 0910   CALCIUM 9.5 03/24/2011 0910   PROT 7.4 03/24/2011 0910   ALBUMIN 5.1 03/24/2011 0910   AST 25 03/24/2011 0910   ALT 20 03/24/2011 0910   ALKPHOS 38 (L) 03/24/2011 0910   BILITOT 1.0 03/24/2011 0910       Component Value Date/Time   WBC 6.6 03/24/2011 0910   RBC 5.16 03/24/2011 0910   HGB 15.6 03/24/2011 0910   HCT 43.0 03/24/2011 0910   PLT 254 03/24/2011 0910   MCV 83.3 03/24/2011 0910   MCH 30.2 03/24/2011 0910   MCHC 36.3 (H) 03/24/2011 0910   RDW 12.6 03/24/2011 0910   LYMPHSABS 2.7 03/24/2011 0910   MONOABS 0.4 03/24/2011 0910   EOSABS 0.2 03/24/2011 0910   BASOSABS 0.0 03/24/2011 0910    No results found for: POCLITH, LITHIUM   No results found for: PHENYTOIN, PHENOBARB, VALPROATE, CBMZ   .res Assessment: Plan:    Plan:  PDMP reviewed  Zoloft 100mg  daily Wellbutrin XL 150mg  daily x 5 days, then leave. Add Pristiq 50mg  daily Increase Buspar 10mg  BID to TID  RTC 2 weeks   Time spent with patient was 30 minutes. Greater than 50% of face to face time with patient was spent on counseling and coordination of care. We discussed medication changes and possible therapy. Patient with limited communication abilities and having difficulties speaking. Friend on the phone communicating for  [facility]. [place] answering some questions. Discussed addition of low dose Abilify, patient declined.  Patient advised to contact office with any questions, adverse effects, or acute worsening in signs and symptoms.   Diagnoses and all orders for this visit:  Generalized anxiety disorder -     desvenlafaxine (PRISTIQ) 50 MG 24 hr tablet; Take 1 tablet (50 mg total) by mouth daily.  Major depressive disorder, recurrent episode, moderate (HCC) -     desvenlafaxine (PRISTIQ) 50 MG 24 hr tablet; Take 1 tablet (50 mg total) by mouth daily.   Please see After Visit Summary for patient specific instructions.  Future Appointments  Date Time Provider Department Center  09/06/2020  8:15 AM , PT [facility]  09/11/2020  7:45 AM Milford Cage, PT [facility]  09/13/2020  8:15 AM Lanell Persons, PT [facility]  09/25/2020  8:00 AM Paa, Milford Cage, PT [facility]    No orders of the defined types were placed in this encounter.     -------------------------------

## 2020-09-05 MED ORDER — DESVENLAFAXINE SUCCINATE ER 50 MG PO TB24: 50.0000 mg | ORAL_TABLET | Freq: Every day | ORAL | 2 refills | Status: DC

## 2020-09-06 ENCOUNTER — Ambulatory Visit: Payer: Managed Care, Other (non HMO) | Admitting: Physical Therapy

## 2020-09-11 ENCOUNTER — Encounter: Payer: Self-pay | Admitting: Physical Therapy

## 2020-09-11 ENCOUNTER — Ambulatory Visit: Payer: Managed Care, Other (non HMO) | Admitting: Physical Therapy

## 2020-09-11 ENCOUNTER — Other Ambulatory Visit: Payer: Self-pay

## 2020-09-11 VITALS — BP 125/88 | HR 76

## 2020-09-11 DIAGNOSIS — M6281 Muscle weakness (generalized): Secondary | ICD-10-CM

## 2020-09-11 DIAGNOSIS — R42 Dizziness and giddiness: Secondary | ICD-10-CM

## 2020-09-11 DIAGNOSIS — I498 Other specified cardiac arrhythmias: Secondary | ICD-10-CM

## 2020-09-11 DIAGNOSIS — G90A Postural orthostatic tachycardia syndrome (POTS): Secondary | ICD-10-CM

## 2020-09-11 DIAGNOSIS — M357 Hypermobility syndrome: Secondary | ICD-10-CM

## 2020-09-11 NOTE — Therapy (Signed)
[facility] [address] [place], [place], [address] Phone: [phone]   Fax:  [phone]  Physical Therapy Treatment  Patient Details  Name: Roberta [name] MRN: [id number] Date of Birth: July 15, 1990 Referring Provider (PT): Dr. Christie Beckers , Dr Merri Brunette   Encounter Date: 09/11/2020   PT End of Session - 09/11/20 0756     Visit Number 3    Number of Visits 16    Date for PT Re-Evaluation 10/09/20    Authorization Type [employer]    PT Start Time 0745    PT Stop Time 0830    PT Time Calculation (min) 45 min             History reviewed. No pertinent past medical history.  History reviewed. No pertinent surgical history.  Vitals:   09/11/20 0753 09/11/20 0754  BP: 125/88   Pulse: 73 76  SpO2: 99% 95%     Subjective Assessment - 09/11/20 0751     Subjective Labs are back to normal . Had to cancel last session per Dr.  Lanae Boast . No more leg pain.  [facility] next week cardiology.  they are sending me to kidney doc.  because my liver enzymes are off.  Has an infusion today.    Currently in Pain? No/denies             [facility] Adult PT Treatment/Exercise - 09/11/20 0001       Lumbar Exercises: Stretches   Active Hamstring Stretch 3 reps;10 seconds    Active Hamstring Stretch Limitations brief after bridging      Lumbar Exercises: Machines for Strengthening   Cybex Knee Extension 15 lbs 2 x 15    Leg Press 1 plate x 20      Lumbar Exercises: Supine   Pelvic Tilt 10 reps    Pelvic Tilt Limitations exhale    Clam Limitations x 15 alternating with balance disc    Bridge 10 reps    Bridge Limitations heels on balance disc      Shoulder Exercises: ROM/Strengthening   Nustep 8 min L4 UE and LE dropped UE last 5 min min due to dyspnea    Lat Pull 2 plate    Lat Pull Limitations 2 x 15    Cybex Press 2 plate    Cybex Press Limitations 2 sets x 15    Cybex Row 3 plate    Cybex Row Limitations 2 x 15                       PT Short Term Goals - 09/11/20 0804       PT SHORT TERM GOAL #1   Title Pt will be consistent and I with HEP for core, large muscle group strengthening    Status On-going      PT SHORT TERM GOAL #2   Title Pt will use RPE and HR monitor (Fitbit) to monitor self while doing cardio    Status On-going      PT SHORT TERM GOAL #3   Title Pt will be able to report feeling less fatigue post work, able to do simple home tasks and not have to go right to bed.    Status On-going               PT Long Term Goals - 08/14/20 0941       PT LONG TERM GOAL #1   Title Pt will be able to show  Independence with HEP and consistent cardio routine    Time 8    Period Weeks    Status On-going    Target Date 10/09/20      PT LONG TERM GOAL #2   Title Patient will be able to squat, lift up to 25 lbs x 10 with no pain, just min fatigue    Time 8    Period Weeks    Status New    Target Date 10/09/20      PT LONG TERM GOAL #3   Title Pt will be able to see a NICU patient for 30 min in standing with min difficulty    Time 8    Period Weeks    Status New    Target Date 10/09/20      PT LONG TERM GOAL #4   Title Pt will be able to show 5/5 strength in core and LEs    Time 8    Period Weeks    Status New    Target Date 10/09/20                   Plan - 09/11/20 0806     Clinical Impression Statement Nickole's labs have stabilized and is feeling better overall.  She cont to have fatigue and weakness. She rates weight machines effort 15 on the Mod Borg scale.    PT Treatment/Interventions ADLs/Self Care Home Management;Moist Heat;Neuromuscular re-education;Balance training;Therapeutic exercise;Therapeutic activities;Manual techniques;Taping    PT Next Visit Plan standing, core as tolerated.  Monitor RPE and HR.    PT Home Exercise Plan Access Code: [email or web address]  URL: [email or web address]  Date: 08/28/2020  Prepared by: Karie Mainland    Exercises   Supine 90/90 with Leg Extensions - 1 x daily - 7 x weekly - 2 sets - 10 reps - 5 hold  Supine Bridge - 1 x daily - 7 x weekly - 2 sets - 10 reps - 5 hold  Active Straight Leg Raise with Quad Set - 1 x daily - 7 x weekly - 2 sets - 10 reps - 5 hold  Prone Hip Extension - 1 x daily - 7 x weekly - 2 sets - 10 reps - 5 hold  Sidelying Hip Abduction - 1 x daily - 7 x weekly - 2 sets - 10 reps - 5 hold  Sidelying Hip Adduction - 1 x daily - 7 x weekly - 2 sets - 10 reps - 5 hold  Supine Shoulder Horizontal Abduction with Resistance - 1 x daily - 7 x weekly - 2 sets - 10 reps  Seated Shoulder Front Raise with Resistance - 1 x daily - 7 x weekly - 2 sets - 10 reps    Consulted and Agree with Plan of Care Patient             Patient will benefit from skilled therapeutic intervention in order to improve the following deficits and impairments:  Decreased balance, Decreased endurance, Decreased mobility, Difficulty walking, Postural dysfunction, Pain, Decreased strength, Decreased activity tolerance, Cardiopulmonary status limiting activity, Dizziness  Visit Diagnosis: Dizziness and giddiness  Hypermobility syndrome  Muscle weakness (generalized)  POTS (postural orthostatic tachycardia syndrome)     Problem List Patient Active Problem List   Diagnosis Date Noted   Generalized anxiety disorder 07/02/2020   Dry heaves 03/28/2020   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 [place], [place], [address] Phone: [phone]   Fax:   [phone]  Name: Roberta [name] MRN: [id number] Date of Birth: 03/21/1991   Karie Mainland, PT 09/11/20 9:45 AM Phone: [phone] Fax: [phone]

## 2020-09-13 ENCOUNTER — Ambulatory Visit: Payer: Managed Care, Other (non HMO) | Admitting: Physical Therapy

## 2020-09-13 ENCOUNTER — Encounter: Payer: Self-pay | Admitting: Physical Therapy

## 2020-09-13 ENCOUNTER — Other Ambulatory Visit: Payer: Self-pay

## 2020-09-13 VITALS — BP 137/95 | HR 76

## 2020-09-13 DIAGNOSIS — M357 Hypermobility syndrome: Secondary | ICD-10-CM

## 2020-09-13 DIAGNOSIS — G90A Postural orthostatic tachycardia syndrome (POTS): Secondary | ICD-10-CM

## 2020-09-13 DIAGNOSIS — I498 Other specified cardiac arrhythmias: Secondary | ICD-10-CM

## 2020-09-13 DIAGNOSIS — R42 Dizziness and giddiness: Secondary | ICD-10-CM | POA: Diagnosis not present

## 2020-09-13 DIAGNOSIS — M6281 Muscle weakness (generalized): Secondary | ICD-10-CM

## 2020-09-13 NOTE — Therapy (Signed)
[facility] [address] [place], [place], [address] Phone: [phone]   Fax:  [phone]  Physical Therapy Treatment  Patient Details  Name: Roberta [name] MRN: [id number] Date of Birth: 1990/10/13 Referring Provider (PT): Dr. Christie Beckers , Dr Merri Brunette   Encounter Date: 09/13/2020   PT End of Session - 09/13/20 0817     Visit Number 4    Number of Visits 16    Date for PT Re-Evaluation 10/09/20    Authorization Type [employer]igna    PT Start Time 0812    PT Stop Time 0856    PT Time Calculation (min) 44 min    Activity Tolerance Patient limited by pain;Patient limited by fatigue    Behavior During Therapy [facility] for tasks assessed/performed             History reviewed. No pertinent past medical history.  History reviewed. No pertinent surgical history.  Vitals:   09/13/20 0819 09/13/20 0823 09/13/20 0901  BP: (!) 151/108 (!) 138/103 (!) 137/95  Pulse: 76  76  SpO2: 100%  99%     Subjective Assessment - 09/13/20 0818     Subjective "I am doing better, I would like to work on balance."    Patient Stated Goals Patientr would like to move and not be so out of breath    Currently in Pain? No/denies                [facility] PT Assessment - 09/13/20 0001       Assessment   Medical Diagnosis [name] , arrhythmia                           [facility] Adult PT Treatment/Exercise - 09/13/20 0001       Lumbar Exercises: Aerobic   Recumbent Bike L2 x 5 min  HR stayed between 98 and 108,  RPE 13 starting and 15 at end      Lumbar Exercises: Supine   Pelvic Tilt 10 reps;5 seconds   cues to breath and avoid holding breath by cou   Bent Knee Raise 10 reps   keeping core tight throughout                Balance Exercises - 09/13/20 0001       Balance Exercises: Standing   Standing Eyes Opened Narrow base of support (BOS);30 secs;Head turns;4 reps   2 x Narrow BOS static pos, 2 x with    Standing Eyes Closed Narrow base of support (BOS);3 reps;30 secs   cues to avoid posterior trunk lean and keep core tight   Other Standing Exercises 1/2 kneeling with 3 x 30 seconds on airex pad LLE leading then RLE   RPE stayed at 13              PT Education - 09/13/20 0910     Education Details Reviewed HEP and updated today for corner balance training. provided information to discuss with her MD about work restrictions and providing to her employer regarding work accommodations.    Person(s) Educated Patient    Methods Explanation;Verbal cues;Handout    Comprehension Verbalized understanding;Verbal cues required              PT Short Term Goals - 09/11/20 0804       PT SHORT TERM GOAL #1   Title Pt will be consistent and I with HEP for core, large muscle group  strengthening    Status On-going      PT SHORT TERM GOAL #2   Title Pt will use RPE and HR monitor (Fitbit) to monitor self while doing cardio    Status On-going      PT SHORT TERM GOAL #3   Title Pt will be able to report feeling less fatigue post work, able to do simple home tasks and not have to go right to bed.    Status On-going               PT Long Term Goals - 08/14/20 0941       PT LONG TERM GOAL #1   Title Pt will be able to show Independence with HEP and consistent cardio routine    Time 8    Period Weeks    Status On-going    Target Date 10/09/20      PT LONG TERM GOAL #2   Title Patient will be able to squat, lift up to 25 lbs x 10 with no pain, just min fatigue    Time 8    Period Weeks    Status New    Target Date 10/09/20      PT LONG TERM GOAL #3   Title Pt will be able to see a NICU patient for 30 min in standing with min difficulty    Time 8    Period Weeks    Status New    Target Date 10/09/20      PT LONG TERM GOAL #4   Title Pt will be able to show 5/5 strength in core and LEs    Time 8    Period Weeks    Status New    Target Date 10/09/20                    Plan - 09/13/20 2122     Clinical Impression Statement Roberta [name] reported no pain today and requested to focus on balance training. started with progressoin of core strengthening in supine which she rated RPE at 13. worked on hip strategy with 1/2 kneeling on airex which she required CGA for safety, and standing balance utilzing rhomberg position with slow head turns and with eyes open/ closed. Balance she reported RPE increased to 15. HR stayed between 98 and 108 throughout session.    PT Treatment/Interventions ADLs/Self Care Home Management;Moist Heat;Neuromuscular re-education;Balance training;Therapeutic exercise;Therapeutic activities;Manual techniques;Taping    PT Next Visit Plan standing, core as tolerated.  Monitor RPE and HR. Did she talk with MD regarding getting a list of restrictoins for work accomodations?    Consulted and Agree with Plan of Care Patient             Patient will benefit from skilled therapeutic intervention in order to improve the following deficits and impairments:  Decreased balance, Decreased endurance, Decreased mobility, Difficulty walking, Postural dysfunction, Pain, Decreased strength, Decreased activity tolerance, Cardiopulmonary status limiting activity, Dizziness  Visit Diagnosis: Dizziness and giddiness  Hypermobility syndrome  Muscle weakness (generalized)  POTS (postural orthostatic tachycardia syndrome)     Problem List Patient Active Problem List   Diagnosis Date Noted   Generalized anxiety disorder 07/02/2020   Dry heaves 03/28/2020   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 [place], [place], [address] Phone: [phone]   Fax:  [phone]  Name: Roberta [name] MRN: [id number] Date of Birth: 10/04/1990

## 2020-09-15 ENCOUNTER — Telehealth: Payer: Self-pay | Admitting: Adult Health

## 2020-09-15 NOTE — Telephone Encounter (Signed)
Noted  

## 2020-09-15 NOTE — Telephone Encounter (Signed)
Left pt voicemail that Roberta [name] received her apt reminder text for Wednesday at 5 pm but it sounded like it was a telehealth instead of in person. It was confirmed it was in person visit. That is the only thing I am aware of from today.

## 2020-09-15 NOTE — Telephone Encounter (Signed)
Friend called because Roberta [name] is very, very upset.  Someone called mother about her upcoming appointment.  She doesn't her mother involved with her appointments or treatment or have any knowledge of coming here.  The friend said Roberta [name] may not be coming back now that this violation of privacy has been broken.  She asked that Roberta [name] or Roberta [name] call icess to talk with her about this.  She said Roberta [name] needs to be seen but is just so upset about this that she may stop treatment.  I do not know who called.  There are no telephone encounters shown in her chart. She should have received a reminder about her appointment, but the only number for calls is Roberta [name]'s.  I don't know how they would have the mother's phone #.  We do not have an ROI on file to speak with the mother. Did someone call the mother?  If so why?  And it needs to be explained to [facility] today no later than tomorrow.

## 2020-09-16 NOTE — Telephone Encounter (Signed)
Noted  

## 2020-09-17 ENCOUNTER — Ambulatory Visit: Payer: 59 | Admitting: Adult Health

## 2020-09-17 NOTE — Telephone Encounter (Signed)
Missy Derrill Kay called regarding Kinslee, this is the friend that we are allowed to speak with. Doretta has given permission verbally with Rene Kocher to communicate with her during the telehealth apt's. Rosene has been advised when she comes in she needs to sign consent for [place] to speak with her.  The discussion of pt's Mom getting contacted was discussed as well.   Leinani was coming to apt today but Missy brings her and has had a death in the family and needs to cancel apt today and reschedule for 10/23/2020 at 5 pm if possible.  Missy also reports Jalyne is feeling better off the Wellbutrin, she is not as angry.

## 2020-09-17 NOTE — Telephone Encounter (Signed)
Noted  

## 2020-09-25 ENCOUNTER — Other Ambulatory Visit: Payer: Self-pay

## 2020-09-25 ENCOUNTER — Ambulatory Visit: Payer: 59 | Admitting: Adult Health

## 2020-09-25 ENCOUNTER — Ambulatory Visit: Payer: Managed Care, Other (non HMO) | Attending: Cardiology | Admitting: Physical Therapy

## 2020-09-25 VITALS — HR 130

## 2020-09-25 DIAGNOSIS — R42 Dizziness and giddiness: Secondary | ICD-10-CM | POA: Diagnosis present

## 2020-09-25 DIAGNOSIS — M6281 Muscle weakness (generalized): Secondary | ICD-10-CM | POA: Diagnosis present

## 2020-09-25 DIAGNOSIS — I498 Other specified cardiac arrhythmias: Secondary | ICD-10-CM | POA: Insufficient documentation

## 2020-09-25 DIAGNOSIS — M357 Hypermobility syndrome: Secondary | ICD-10-CM | POA: Diagnosis present

## 2020-09-25 DIAGNOSIS — G90A Postural orthostatic tachycardia syndrome (POTS): Secondary | ICD-10-CM

## 2020-09-25 NOTE — Therapy (Signed)
[facility] [address] [place], [place], [address] Phone: [phone]   Fax:  [phone]  Physical Therapy Treatment  Patient Details  Name: Roberta [name] MRN: [id number] Date of Birth: [birth year] Referring Provider (PT): Dr. Christie Beckers , Dr Merri Brunette   Encounter Date: 09/25/2020   PT End of Session - 09/25/20 1021     Visit Number 5    Number of Visits 16    Date for PT Re-Evaluation 10/09/20    Authorization Type [employer]    PT Start Time 0800    PT Stop Time 0844    PT Time Calculation (min) 44 min    Activity Tolerance Patient limited by fatigue    Behavior During Therapy [facility] for tasks assessed/performed             No past medical history on file.  No past surgical history on file.  Vitals:   09/25/20 0825 09/25/20 0826  Pulse: 68 (!) 130     Subjective Assessment - 09/25/20 0802     Subjective I feel terrible. I am so short of breath.  I want to lie down al the time.  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  Sees Mobarek and kidney doc next week.  No pain .    Currently in Pain? No/denies                 [facility] Adult PT Treatment/Exercise - 09/25/20 0001       Lumbar Exercises: Aerobic   UBE (Upper Arm Bike) L1      Lumbar Exercises: Standing   Heel Raises 15 reps    Functional Squats 15 reps    Functional Squats Limitations UE light touch for balance    Other Standing Lumbar Exercises hip extension leaning over counter x 10 each side , 2 lbs cuff weights    Other Standing Lumbar Exercises high march , slow, 2 lbs x 10 each      Lumbar Exercises: Supine   Bridge 10 reps    Bridge Limitations 2 sets, 1 with slight hip turnout    Bridge with March 10 reps    Bridge with [employer] Limitations 2 lbs    Straight Leg Raise 10 reps    Straight Leg Raises Limitations 2 lbs      Lumbar Exercises: Sidelying   Hip Abduction Both;10 reps      Shoulder Exercises: Supine    Flexion Limitations overhead lift yellow looped band x 10    Other Supine Exercises chest press 4 lbs x 15 and then triceps (elbow pain) modified to be done with yellow looped band (skull crusher)                      PT Short Term Goals - 09/25/20 1021       PT SHORT TERM GOAL #1   Title Pt will be consistent and I with HEP for core, large muscle group strengthening    Status Achieved      PT SHORT TERM GOAL #2   Title Pt will use RPE and HR monitor (Fitbit) to monitor self while doing cardio    Status Achieved      PT SHORT TERM GOAL #3   Title Pt will be able to report feeling less fatigue post work, able to do simple home tasks and not have to go right to bed.    Status On-going  PT Long Term Goals - 08/14/20 0941       PT LONG TERM GOAL #1   Title Pt will be able to show Independence with HEP and consistent cardio routine    Time 8    Period Weeks    Status On-going    Target Date 10/09/20      PT LONG TERM GOAL #2   Title Patient will be able to squat, lift up to 25 lbs x 10 with no pain, just min fatigue    Time 8    Period Weeks    Status New    Target Date 10/09/20      PT LONG TERM GOAL #3   Title Pt will be able to see a NICU patient for 30 min in standing with min difficulty    Time 8    Period Weeks    Status New    Target Date 10/09/20      PT LONG TERM GOAL #4   Title Pt will be able to show 5/5 strength in core and LEs    Time 8    Period Weeks    Status New    Target Date 10/09/20                   Plan - 09/25/20 0804     Clinical Impression Statement Patient able to tolerate session today , rated 15 (hard) on RPE scale.  HR response appropriate and BP stable end of session.  She has been feeling more short of breath lately and it is unclear as to the cause.  She had no pain but lifting light weight in supine was a challenge to stabilize wrist and ankle. 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    PT Treatment/Interventions ADLs/Self Care Home Management;Moist Heat;Neuromuscular re-education;Balance training;Therapeutic exercise;Therapeutic activities;Manual techniques;Taping    PT Next Visit Plan standing, core as tolerated.  Monitor RPE and HR. (NEEDS TO:Did she talk with MD regarding getting a list of restrictoins for work accomodations?)    PT Home Exercise Plan Access Code: [email or web address]  URL: [email or web address]  Date: 08/28/2020  Prepared by: Karie Mainland    Exercises  Supine 90/90 with Leg Extensions - 1 x daily - 7 x weekly - 2 sets - 10 reps - 5 hold  Supine Bridge - 1 x daily - 7 x weekly - 2 sets - 10 reps - 5 hold  Active Straight Leg Raise with Quad Set - 1 x daily - 7 x weekly - 2 sets - 10 reps - 5 hold  Prone Hip Extension - 1 x daily - 7 x weekly - 2 sets - 10 reps - 5 hold  Sidelying Hip Abduction - 1 x daily - 7 x weekly - 2 sets - 10 reps - 5 hold  Sidelying Hip Adduction - 1 x daily - 7 x weekly - 2 sets - 10 reps - 5 hold  Supine Shoulder Horizontal Abduction with Resistance - 1 x daily - 7 x weekly - 2 sets - 10 reps  Seated Shoulder Front Raise with Resistance - 1 x daily - 7 x weekly - 2 sets - 10 reps    Consulted and Agree with Plan of Care Patient             Patient will benefit from  skilled therapeutic intervention in order to improve the following deficits and impairments:  Decreased balance, Decreased endurance, Decreased mobility, Difficulty walking, Postural dysfunction, Pain, Decreased strength, Decreased activity tolerance, Cardiopulmonary status limiting activity, Dizziness  Visit Diagnosis: Dizziness and giddiness  Muscle weakness (generalized)  POTS (postural orthostatic tachycardia syndrome)  Hypermobility syndrome     Problem List Patient Active Problem List   Diagnosis Date Noted   Generalized  anxiety disorder 07/02/2020   Dry heaves 03/28/2020   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 [place], [place], [address] Phone: [phone]   Fax:  [phone]  Name: Roberta [name] MRN: [id number] Date of Birth: Jul 22, 1990   Karie Mainland, PT 09/25/20 10:32 AM Phone: [phone] Fax: [phone]

## 2020-09-28 ENCOUNTER — Other Ambulatory Visit: Payer: Self-pay | Admitting: Adult Health

## 2020-10-02 ENCOUNTER — Other Ambulatory Visit: Payer: Self-pay

## 2020-10-02 ENCOUNTER — Ambulatory Visit: Payer: Managed Care, Other (non HMO) | Admitting: Physical Therapy

## 2020-10-02 ENCOUNTER — Encounter: Payer: Self-pay | Admitting: Physical Therapy

## 2020-10-02 VITALS — HR 99

## 2020-10-02 DIAGNOSIS — M6281 Muscle weakness (generalized): Secondary | ICD-10-CM

## 2020-10-02 DIAGNOSIS — G90A Postural orthostatic tachycardia syndrome (POTS): Secondary | ICD-10-CM

## 2020-10-02 DIAGNOSIS — I498 Other specified cardiac arrhythmias: Secondary | ICD-10-CM

## 2020-10-02 DIAGNOSIS — R42 Dizziness and giddiness: Secondary | ICD-10-CM | POA: Diagnosis not present

## 2020-10-02 DIAGNOSIS — M357 Hypermobility syndrome: Secondary | ICD-10-CM

## 2020-10-02 NOTE — Therapy (Signed)
[facility] [address] [place], [place], [address] Phone: [phone]   Fax:  [phone]  Physical Therapy Treatment  Patient Details  Name: Roberta [name] MRN: [id number] Date of Birth: [birth year] Referring Provider (PT): Dr. Christie Beckers , Dr Merri Brunette   Encounter Date: 10/02/2020   PT End of Session - 10/02/20 0806     Visit Number 6    Number of Visits 16    Date for PT Re-Evaluation 10/09/20    Authorization Type [employer]    PT Start Time 0800    Activity Tolerance Patient limited by fatigue;Patient tolerated treatment well    Behavior During Therapy [facility] for tasks assessed/performed             History reviewed. No pertinent past medical history.  History reviewed. No pertinent surgical history.  Vitals:   10/02/20 0808  Pulse: 99  SpO2: 96%     Subjective Assessment - 10/02/20 0803     Subjective Stil working on NICU.  No report of pain, I am better than last week.   Kidney doc today, will address creatinine.    Currently in Pain? No/denies               [facility] Adult PT Treatment/Exercise - 10/02/20 0001       Self-Care   Self-Care Other Self-Care Comments    Other Self-Care Comments  work [employer] architect, Location manager options with job change      Lumbar Exercises: Standing   Functional Squats 15 reps    Functional Squats Limitations bicep curl 5 lbs, then overhead press x 15   increased time to recover, dizzy   Forward Lunge Limitations split lunge tap knee on Airex x 10 each , chair available for balance    Other Standing Lumbar Exercises kneeling "bridge" with red looped overhead press    Other Standing Lumbar Exercises tall kneeling on Airex with 5 lbs : chest press and hinge x 10 each      Lumbar Exercises: Seated   Sit to Stand 15 reps    Sit to Stand Limitations 10 lbs dumbbells at chest x 2 sets , cues for slow controlled descent      Lumbar Exercises: Prone   Other Prone Lumbar Exercises  modified burpee on countertop: plank step backs, x 10                      PT Short Term Goals - 09/25/20 1021       PT SHORT TERM GOAL #1   Title Pt will be consistent and I with HEP for core, large muscle group strengthening    Status Achieved      PT SHORT TERM GOAL #2   Title Pt will use RPE and HR monitor (Fitbit) to monitor self while doing cardio    Status Achieved      PT SHORT TERM GOAL #3   Title Pt will be able to report feeling less fatigue post work, able to do simple home tasks and not have to go right to bed.    Status On-going               PT Long Term Goals - 08/14/20 0941       PT LONG TERM GOAL #1   Title Pt will be able to show Independence with HEP and consistent cardio routine    Time 8    Period Weeks    Status On-going  Target Date 10/09/20      PT LONG TERM GOAL #2   Title Patient will be able to squat, lift up to 25 lbs x 10 with no pain, just min fatigue    Time 8    Period Weeks    Status New    Target Date 10/09/20      PT LONG TERM GOAL #3   Title Pt will be able to see a NICU patient for 30 min in standing with min difficulty    Time 8    Period Weeks    Status New    Target Date 10/09/20      PT LONG TERM GOAL #4   Title Pt will be able to show 5/5 strength in core and LEs    Time 8    Period Weeks    Status New    Target Date 10/09/20                   Plan - 10/02/20 0807     Clinical Impression Statement Patient tolerated exercise well today, likely due to the decrease in workload at work this week.  She asked that we write up a list of desired and realistic accomodations for her current job and the cardiologist will sign it.  Worked on elevation change per her request and she did quite well, becoming slightly dizzy for a few moments, HR up to 117 max.  Cont POC.    PT Treatment/Interventions ADLs/Self Care Home Management;Moist Heat;Neuromuscular re-education;Balance training;Therapeutic  exercise;Therapeutic activities;Manual techniques;Taping    PT Next Visit Plan ? add to HEP for elevation change? standing, core as tolerated.  Monitor RPE and HR. accomodations?    PT Home Exercise Plan Access Code: [email or web address]  URL: [email or web address]  Date: 08/28/2020  Prepared by: Karie Mainland    Exercises  Supine 90/90 with Leg Extensions - 1 x daily - 7 x weekly - 2 sets - 10 reps - 5 hold  Supine Bridge - 1 x daily - 7 x weekly - 2 sets - 10 reps - 5 hold  Active Straight Leg Raise with Quad Set - 1 x daily - 7 x weekly - 2 sets - 10 reps - 5 hold  Prone Hip Extension - 1 x daily - 7 x weekly - 2 sets - 10 reps - 5 hold  Sidelying Hip Abduction - 1 x daily - 7 x weekly - 2 sets - 10 reps - 5 hold  Sidelying Hip Adduction - 1 x daily - 7 x weekly - 2 sets - 10 reps - 5 hold  Supine Shoulder Horizontal Abduction with Resistance - 1 x daily - 7 x weekly - 2 sets - 10 reps  Seated Shoulder Front Raise with Resistance - 1 x daily - 7 x weekly - 2 sets - 10 reps    Consulted and Agree with Plan of Care Patient             Patient will benefit from skilled therapeutic intervention in order to improve the following deficits and impairments:  Decreased balance, Decreased endurance, Decreased mobility, Difficulty walking, Postural dysfunction, Pain, Decreased strength, Decreased activity tolerance, Cardiopulmonary status limiting activity, Dizziness  Visit Diagnosis: Dizziness and giddiness  Muscle weakness (generalized)  Hypermobility syndrome  POTS (postural orthostatic tachycardia syndrome)     Problem List Patient Active Problem List   Diagnosis Date Noted   Generalized anxiety disorder 07/02/2020   Dry heaves 03/28/2020   Gastric intestinal metaplasia without  dysplasia, involving the cardia 03/28/2020   Nausea and vomiting 03/28/2020   Ehlers-Danlos disease 11/22/2019   Chronic eczematous otitis externa of both ears 12/07/2018   Excessive cerumen in both ear canals  12/07/2018   POTS (postural orthostatic tachycardia syndrome) 11/04/2014   Benign essential hypertension 10/10/2014   Lymphadenopathy 02/10/2011    Brewer Hitchman 10/02/2020, 9:23 AM  [facility] Outpatient Rehabilitation [facility] [address] [place], [place], [address] Phone: [phone]   Fax:  [phone]  Name: LIANY MUMPOWER MRN: [id number] Date of Birth: 17-Jan-1991  Karie Mainland, PT 10/02/20 9:23 AM Phone: [phone] Fax: [phone]

## 2020-10-09 ENCOUNTER — Ambulatory Visit: Payer: Managed Care, Other (non HMO) | Admitting: Physical Therapy

## 2020-10-09 ENCOUNTER — Other Ambulatory Visit: Payer: Self-pay

## 2020-10-09 ENCOUNTER — Encounter: Payer: Self-pay | Admitting: Physical Therapy

## 2020-10-09 VITALS — BP 144/107 | HR 76

## 2020-10-09 DIAGNOSIS — M357 Hypermobility syndrome: Secondary | ICD-10-CM

## 2020-10-09 DIAGNOSIS — R42 Dizziness and giddiness: Secondary | ICD-10-CM | POA: Diagnosis not present

## 2020-10-09 DIAGNOSIS — I498 Other specified cardiac arrhythmias: Secondary | ICD-10-CM

## 2020-10-09 DIAGNOSIS — G90A Postural orthostatic tachycardia syndrome (POTS): Secondary | ICD-10-CM

## 2020-10-09 DIAGNOSIS — M6281 Muscle weakness (generalized): Secondary | ICD-10-CM

## 2020-10-09 NOTE — Therapy (Signed)
[facility] Outpatient Rehabilitation [facility] [address] [place], [place], [address] Phone: [phone]   Fax:  [phone]  Physical Therapy Treatment / Re-certification  Patient Details  Name: Roberta SARIN MRN: [id number] Date of Birth: 06-15-1990 Referring Provider (PT): Dr. Christie Beckers , Dr Merri Brunette   Encounter Date: 10/09/2020   PT End of Session - 10/09/20 1505     Visit Number 7    Number of Visits 16    Date for PT Re-Evaluation 12/04/20    PT Start Time 1417    PT Stop Time 1501    PT Time Calculation (min) 44 min    Activity Tolerance Patient limited by fatigue;Patient tolerated treatment well;Patient limited by lethargy    Behavior During Therapy [facility] for tasks assessed/performed             History reviewed. No pertinent past medical history.  History reviewed. No pertinent surgical history.  Vitals:   10/09/20 1420 10/09/20 1429 10/09/20 1454  BP: (!) 132/100 (!) 145/99 (!) 144/107  Pulse: 90  76  SpO2: 100%  99%     Subjective Assessment - 10/09/20 1426     Subjective "Today I am not feeling great today, I had a 2 kideny stones last week. currently at 11/20 RPE at rest."    Patient Stated Goals Patientr would like to move and not be so out of breath    Currently in Pain? No/denies                [facility] PT Assessment - 10/09/20 0001       Assessment   Medical Diagnosis [name] , arrhythmia    Referring Provider (PT) Dr. Christie Beckers , Dr Merri Brunette                           [facility] Adult PT Treatment/Exercise - 10/09/20 0001       Knee/Hip Exercises: Supine   Bridges 2 sets;10 reps   with glute set   Straight Leg Raises 10 reps;1 set;Both    Other Supine Knee/Hip Exercises supine marching 2 x 20 alternating L/R, 1 set with green  blue theraband   12/20 RPE   Other Supine Knee/Hip Exercises clamshell with blue theraband.                      PT Short Term Goals - 10/09/20  1436       PT SHORT TERM GOAL #3   Title Pt will be able to report feeling less fatigue post work, able to do simple home tasks and not have to go right to bed.    Period Weeks    Status On-going               PT Long Term Goals - 10/09/20 1437       PT LONG TERM GOAL #1   Title Pt will be able to show Independence with HEP and consistent cardio routine    Period Weeks    Status On-going      PT LONG TERM GOAL #2   Title Patient will be able to squat, lift up to 25 lbs x 10 with no pain, just min fatigue    Period Weeks    Status On-going      PT LONG TERM GOAL #3   Title Pt will be able to see a NICU patient for 30 min in standing with min  difficulty    Period Weeks    Status On-going      PT LONG TERM GOAL #4   Title Pt will be able to show 5/5 strength in core and LEs      PT LONG TERM GOAL #5   Title Pt will be able to show 5/5 strength in core and LEs    Period Weeks                   Plan - 10/09/20 1438     Clinical Impression Statement pt arrives to session noting she hasn't been feeling very well, BP revealed elvated systolic and diastolic pressure, and was monitored throughout session with diastolic averaging between 99-107. due to eleved diastolic pressure focused session in supine working on hip strengthening which she consistency stayed between 11-13 RPE. She has made limited progress toward her goals but continues to demonstrate motivation to make functional progress and would benefit from continued physical therapy to maximize her function and improve her QOL.    PT Frequency 2x / week    PT Duration 8 weeks    PT Treatment/Interventions ADLs/Self Care Home Management;Moist Heat;Neuromuscular re-education;Balance training;Therapeutic exercise;Therapeutic activities;Manual techniques;Taping    PT Next Visit Plan ? add to HEP for elevation change? standing, core as tolerated.  Monitor RPE and HR. accomodations?    PT Home Exercise Plan Access  Code: [email or web address]  URL: [email or web address]  Date: 08/28/2020  Prepared by: Karie Mainland    Exercises  Supine 90/90 with Leg Extensions - 1 x daily - 7 x weekly - 2 sets - 10 reps - 5 hold  Supine Bridge - 1 x daily - 7 x weekly - 2 sets - 10 reps - 5 hold  Active Straight Leg Raise with Quad Set - 1 x daily - 7 x weekly - 2 sets - 10 reps - 5 hold  Prone Hip Extension - 1 x daily - 7 x weekly - 2 sets - 10 reps - 5 hold  Sidelying Hip Abduction - 1 x daily - 7 x weekly - 2 sets - 10 reps - 5 hold  Sidelying Hip Adduction - 1 x daily - 7 x weekly - 2 sets - 10 reps - 5 hold  Supine Shoulder Horizontal Abduction with Resistance - 1 x daily - 7 x weekly - 2 sets - 10 reps  Seated Shoulder Front Raise with Resistance - 1 x daily - 7 x weekly - 2 sets - 10 reps    Consulted and Agree with Plan of Care Patient             Patient will benefit from skilled therapeutic intervention in order to improve the following deficits and impairments:  Decreased balance, Decreased endurance, Decreased mobility, Difficulty walking, Postural dysfunction, Pain, Decreased strength, Decreased activity tolerance, Cardiopulmonary status limiting activity, Dizziness  Visit Diagnosis: Dizziness and giddiness  Muscle weakness (generalized)  Hypermobility syndrome  POTS (postural orthostatic tachycardia syndrome)     Problem List Patient Active Problem List   Diagnosis Date Noted   Generalized anxiety disorder 07/02/2020   Dry heaves 03/28/2020   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 [place], [place], [address] Phone: [phone]   Fax:  [phone]  Name: Roberta [name] MRN: [id number] Date of Birth: 29-Apr-1990

## 2020-10-11 ENCOUNTER — Ambulatory Visit: Payer: Managed Care, Other (non HMO) | Admitting: Physical Therapy

## 2020-10-11 ENCOUNTER — Other Ambulatory Visit: Payer: Self-pay

## 2020-10-11 VITALS — BP 146/97 | HR 87

## 2020-10-11 DIAGNOSIS — M6281 Muscle weakness (generalized): Secondary | ICD-10-CM

## 2020-10-11 DIAGNOSIS — R42 Dizziness and giddiness: Secondary | ICD-10-CM | POA: Diagnosis not present

## 2020-10-11 DIAGNOSIS — G90A Postural orthostatic tachycardia syndrome (POTS): Secondary | ICD-10-CM

## 2020-10-11 DIAGNOSIS — I498 Other specified cardiac arrhythmias: Secondary | ICD-10-CM

## 2020-10-11 DIAGNOSIS — M357 Hypermobility syndrome: Secondary | ICD-10-CM

## 2020-10-11 NOTE — Therapy (Signed)
[facility] Outpatient Rehabilitation [facility] [address] [place], [place], [address] Phone: [phone]   Fax:  [phone]  Physical Therapy Treatment  Patient Details  Name: Roberta [name] MRN: [id number] Date of Birth: [birth year] Referring Provider (PT): Dr. Christie Beckers , Dr Merri Brunette   Encounter Date: 10/11/2020   PT End of Session - 10/11/20 0820     Visit Number 8    Number of Visits 16    Date for PT Re-Evaluation 12/04/20    Authorization Type [employer]    PT Start Time 0818    PT Stop Time 0858    PT Time Calculation (min) 40 min    Activity Tolerance Patient limited by fatigue;Patient tolerated treatment well;Patient limited by lethargy    Behavior During Therapy [facility] for tasks assessed/performed             No past medical history on file.  No past surgical history on file.  Vitals:   10/11/20 0821 10/11/20 0858  BP: 133/86 (!) 146/97  Pulse: 76 87  SpO2: 98%      Subjective Assessment - 10/11/20 0820     Subjective "feeling better today, compared to the last session."    Patient Stated Goals Patientr would like to move and not be so out of breath    Currently in Pain? No/denies                [facility] PT Assessment - 10/11/20 0001       Assessment   Medical Diagnosis [name] , arrhythmia    Referring Provider (PT) Dr. Christie Beckers , Dr Merri Brunette                           [facility] Adult PT Treatment/Exercise - 10/11/20 0001       Therapeutic Activites    Therapeutic Activities Other Therapeutic Activities    Other Therapeutic Activities log rolling going supine to sit, required frequent cues to go slow and demosntration for proper form. Verbal cues to keep breathing during activity.   RPE 13     Lumbar Exercises: Aerobic   Recumbent Bike L1 x 5 min  HR stayed between 95 -96 and ,  RPE 11 end of aerobic      Lumbar Exercises: Seated   Sit to Stand 10 reps                 Balance  Exercises - 10/11/20 0001       Balance Exercises: Standing   Other Standing Exercises Comments seated balance on on airex maintianing balance, and 3 x 30 sec, marching while seated on airex pad 2 x 10               PT Education - 10/11/20 0906     Education Details provided handout to give to her MD for work accomodations. reviewed log rolling techniques    Person(s) Educated Patient    Methods Explanation;Verbal cues;Handout    Comprehension Verbalized understanding              PT Short Term Goals - 10/09/20 1436       PT SHORT TERM GOAL #3   Title Pt will be able to report feeling less fatigue post work, able to do simple home tasks and not have to go right to bed.    Period Weeks    Status On-going  PT Long Term Goals - 10/09/20 1437       PT LONG TERM GOAL #1   Title Pt will be able to show Independence with HEP and consistent cardio routine    Period Weeks    Status On-going      PT LONG TERM GOAL #2   Title Patient will be able to squat, lift up to 25 lbs x 10 with no pain, just min fatigue    Period Weeks    Status On-going      PT LONG TERM GOAL #3   Title Pt will be able to see a NICU patient for 30 min in standing with min difficulty    Period Weeks    Status On-going      PT LONG TERM GOAL #4   Title Pt will be able to show 5/5 strength in core and LEs      PT LONG TERM GOAL #5   Title Pt will be able to show 5/5 strength in core and LEs    Period Weeks                   Plan - 10/11/20 2706     Clinical Impression Statement pt arrives to session reporting she feels much better than last session and her BP was 133/86. worked on seated and standing balance avoiding posterior trunk lean. increased time was taken to review log rolling for proper form and to work slowly while avoiding holding her breath to reduce how dizzy she gets with a supine to sit transition.  RPE stayed between 11 and 14 throughout session.     PT Treatment/Interventions ADLs/Self Care Home Management;Moist Heat;Neuromuscular re-education;Balance training;Therapeutic exercise;Therapeutic activities;Manual techniques;Taping    PT Next Visit Plan ? add to HEP for elevation change? standing, core as tolerated.  Monitor RPE and HR.  see's MD in 2 weeks, did she give them the list of accomodations?    PT Home Exercise Plan Access Code: [email or web address]  URL: [email or web address]  Date: 08/28/2020  Prepared by: Karie Mainland    Exercises  Supine 90/90 with Leg Extensions - 1 x daily - 7 x weekly - 2 sets - 10 reps - 5 hold  Supine Bridge - 1 x daily - 7 x weekly - 2 sets - 10 reps - 5 hold  Active Straight Leg Raise with Quad Set - 1 x daily - 7 x weekly - 2 sets - 10 reps - 5 hold  Prone Hip Extension - 1 x daily - 7 x weekly - 2 sets - 10 reps - 5 hold  Sidelying Hip Abduction - 1 x daily - 7 x weekly - 2 sets - 10 reps - 5 hold  Sidelying Hip Adduction - 1 x daily - 7 x weekly - 2 sets - 10 reps - 5 hold  Supine Shoulder Horizontal Abduction with Resistance - 1 x daily - 7 x weekly - 2 sets - 10 reps  Seated Shoulder Front Raise with Resistance - 1 x daily - 7 x weekly - 2 sets - 10 reps    Consulted and Agree with Plan of Care Patient             Patient will benefit from skilled therapeutic intervention in order to improve the following deficits and impairments:  Decreased balance, Decreased endurance, Decreased mobility, Difficulty walking, Postural dysfunction, Pain, Decreased strength, Decreased activity tolerance, Cardiopulmonary status limiting activity, Dizziness  Visit Diagnosis: Dizziness and giddiness  Muscle weakness (generalized)  Hypermobility syndrome  POTS (postural orthostatic tachycardia syndrome)     Problem List Patient Active Problem List   Diagnosis Date Noted   Generalized anxiety disorder 07/02/2020   Dry heaves 03/28/2020   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 [place], [place], [address] Phone: [phone]   Fax:  [phone]  Name: GENNESIS HOGLAND MRN: [id number] Date of Birth: [birth year]

## 2020-10-16 ENCOUNTER — Encounter: Payer: Self-pay | Admitting: Physical Therapy

## 2020-10-16 ENCOUNTER — Ambulatory Visit: Payer: Managed Care, Other (non HMO) | Admitting: Physical Therapy

## 2020-10-16 ENCOUNTER — Other Ambulatory Visit: Payer: Self-pay

## 2020-10-16 VITALS — BP 141/80 | HR 79

## 2020-10-16 DIAGNOSIS — M6281 Muscle weakness (generalized): Secondary | ICD-10-CM

## 2020-10-16 DIAGNOSIS — G90A Postural orthostatic tachycardia syndrome (POTS): Secondary | ICD-10-CM

## 2020-10-16 DIAGNOSIS — R42 Dizziness and giddiness: Secondary | ICD-10-CM

## 2020-10-16 DIAGNOSIS — I498 Other specified cardiac arrhythmias: Secondary | ICD-10-CM

## 2020-10-16 DIAGNOSIS — M357 Hypermobility syndrome: Secondary | ICD-10-CM

## 2020-10-16 NOTE — Therapy (Signed)
[facility] Outpatient Rehabilitation [facility] [address] [place], [place], [address] Phone: [phone]   Fax:  [phone]  Physical Therapy Treatment  Patient Details  Name: Roberta [name] MRN: [id number] Date of Birth: 05/16/1990 Referring Provider (PT): Dr. Christie Beckers , Dr Merri Brunette   Encounter Date: 10/16/2020   PT End of Session - 10/16/20 0754     Visit Number 9    Number of Visits 16    Date for PT Re-Evaluation 12/04/20    Authorization Type [employer]    PT Start Time 0755    PT Stop Time 0840    PT Time Calculation (min) 45 min    Activity Tolerance Patient limited by fatigue;Patient tolerated treatment well;Patient limited by lethargy    Behavior During Therapy [facility] for tasks assessed/performed             History reviewed. No pertinent past medical history.  History reviewed. No pertinent surgical history.  Vitals:   10/16/20 0801 10/16/20 0839  BP: (!) 148/96 (!) 141/80  Pulse: 68 79  SpO2: 96% 95%     Subjective Assessment - 10/16/20 0801     Subjective "I am working on getting with getting into bed still owrking on getting out of bed, feel like I am doing okay."    Currently in Pain? No/denies                [facility] PT Assessment - 10/16/20 0001       Assessment   Medical Diagnosis [name] , arrhythmia    Referring Provider (PT) Dr. Christie Beckers , Dr Merri Brunette                           [facility] Adult PT Treatment/Exercise - 10/16/20 0001       Lumbar Exercises: Aerobic   Recumbent Bike L3 x 5 min HR stayed between 95 - 100   RPE 11     Lumbar Exercises: Machines for Strengthening   Other Lumbar Machine Exercise standing palloff press 2 x 10 with 10# bil cues to keep knees slightly bent (which present as an issue, especially for the RLE).   RPE 13     Lumbar Exercises: Standing   Other Standing Lumbar Exercises functional squat holding onto free motions 2 x 10   RPE 13     Lumbar  Exercises: Supine   Bent Knee Raise 5 reps   alternating L/R with 1 sec hold in the middle (with both knees up) x 3 sets   Bridge 10 reps      Knee/Hip Exercises: Machines for Strengthening   Hip Cybex 2 x  12 40# cues to avoid locking the knees, 2 x 12 heel raised with knees in soft lock pos   intermittent cues for breathing RPE at 15     Knee/Hip Exercises: Supine   Bridges Strengthening;2 sets;10 reps   with claves on red physioball cues to keep core tight and breath   Bridges Limitations RPE                      PT Short Term Goals - 10/09/20 1436       PT SHORT TERM GOAL #3   Title Pt will be able to report feeling less fatigue post work, able to do simple home tasks and not have to go right to bed.    Period Weeks    Status On-going  PT Long Term Goals - 10/09/20 1437       PT LONG TERM GOAL #1   Title Pt will be able to show Independence with HEP and consistent cardio routine    Period Weeks    Status On-going      PT LONG TERM GOAL #2   Title Patient will be able to squat, lift up to 25 lbs x 10 with no pain, just min fatigue    Period Weeks    Status On-going      PT LONG TERM GOAL #3   Title Pt will be able to see a NICU patient for 30 min in standing with min difficulty    Period Weeks    Status On-going      PT LONG TERM GOAL #4   Title Pt will be able to show 5/5 strength in core and LEs      PT LONG TERM GOAL #5   Title Pt will be able to show 5/5 strength in core and LEs    Period Weeks                   Plan - 10/16/20 0837     Clinical Impression Statement Roberta [name] arrived to session noting she is doing better today, and has been working on transitions especially getting in and out of bed. pt requested to focus on stability, so focued on hip/ core strengthening in standing supine positioning. She does requring intermittent cues to focus on breathing. RPE stayed between 13 and 15 today with all exercises, and HR  stayed between 90 - 102 with all exercise today.    PT Treatment/Interventions ADLs/Self Care Home Management;Moist Heat;Neuromuscular re-education;Balance training;Therapeutic exercise;Therapeutic activities;Manual techniques;Taping    PT Next Visit Plan ? add to HEP for elevation change? standing, core as tolerated.  Monitor RPE and HR.  see's MD in 2 weeks, did she give them the list of accomodations?    PT Home Exercise Plan Access Code: [email or web address]  URL: [email or web address]  Date: 08/28/2020  Prepared by: Karie Mainland    Exercises  Supine 90/90 with Leg Extensions - 1 x daily - 7 x weekly - 2 sets - 10 reps - 5 hold  Supine Bridge - 1 x daily - 7 x weekly - 2 sets - 10 reps - 5 hold  Active Straight Leg Raise with Quad Set - 1 x daily - 7 x weekly - 2 sets - 10 reps - 5 hold  Prone Hip Extension - 1 x daily - 7 x weekly - 2 sets - 10 reps - 5 hold  Sidelying Hip Abduction - 1 x daily - 7 x weekly - 2 sets - 10 reps - 5 hold  Sidelying Hip Adduction - 1 x daily - 7 x weekly - 2 sets - 10 reps - 5 hold  Supine Shoulder Horizontal Abduction with Resistance - 1 x daily - 7 x weekly - 2 sets - 10 reps  Seated Shoulder Front Raise with Resistance - 1 x daily - 7 x weekly - 2 sets - 10 reps    Consulted and Agree with Plan of Care Patient             Patient will benefit from skilled therapeutic intervention in order to improve the following deficits and impairments:  Decreased balance, Decreased endurance, Decreased mobility, Difficulty walking, Postural dysfunction, Pain, Decreased strength, Decreased activity tolerance, Cardiopulmonary status limiting activity, Dizziness  Visit Diagnosis: Dizziness and giddiness  Muscle weakness (  generalized)  Hypermobility syndrome  POTS (postural orthostatic tachycardia syndrome)     Problem List Patient Active Problem List   Diagnosis Date Noted   Generalized anxiety disorder 07/02/2020   Dry heaves 03/28/2020   Gastric intestinal  metaplasia without dysplasia, involving the cardia 03/28/2020   Nausea and vomiting 03/28/2020   Ehlers-Danlos disease 11/22/2019   Chronic eczematous otitis externa of both ears 12/07/2018   Excessive cerumen in both ear canals 12/07/2018   POTS (postural orthostatic tachycardia syndrome) 11/04/2014   Benign essential hypertension 10/10/2014   Lymphadenopathy 02/10/2011   Lulu Riding PT, DPT, LAT, ATC  10/16/20  8:45 AM      [facility] Health Outpatient Rehabilitation [facility] [address] [place], [place], [address] Phone: [phone]   Fax:  [phone]  Name: Roberta [name] MRN: [id number] Date of Birth: 1991/02/15

## 2020-10-17 ENCOUNTER — Ambulatory Visit: Payer: 59 | Admitting: Adult Health

## 2020-10-23 ENCOUNTER — Encounter: Payer: Self-pay | Admitting: Physical Therapy

## 2020-10-23 ENCOUNTER — Ambulatory Visit (INDEPENDENT_AMBULATORY_CARE_PROVIDER_SITE_OTHER): Payer: 59 | Admitting: Adult Health

## 2020-10-23 ENCOUNTER — Other Ambulatory Visit: Payer: Self-pay

## 2020-10-23 ENCOUNTER — Ambulatory Visit: Payer: Managed Care, Other (non HMO) | Attending: Cardiology | Admitting: Physical Therapy

## 2020-10-23 DIAGNOSIS — F331 Major depressive disorder, recurrent, moderate: Secondary | ICD-10-CM

## 2020-10-23 DIAGNOSIS — I498 Other specified cardiac arrhythmias: Secondary | ICD-10-CM | POA: Insufficient documentation

## 2020-10-23 DIAGNOSIS — M6281 Muscle weakness (generalized): Secondary | ICD-10-CM | POA: Insufficient documentation

## 2020-10-23 DIAGNOSIS — F411 Generalized anxiety disorder: Secondary | ICD-10-CM | POA: Diagnosis not present

## 2020-10-23 DIAGNOSIS — R42 Dizziness and giddiness: Secondary | ICD-10-CM | POA: Diagnosis present

## 2020-10-23 DIAGNOSIS — M357 Hypermobility syndrome: Secondary | ICD-10-CM | POA: Diagnosis present

## 2020-10-23 MED ORDER — ESCITALOPRAM OXALATE 20 MG PO TABS: 20.0000 mg | ORAL_TABLET | Freq: Every day | ORAL | 1 refills | Status: DC

## 2020-10-23 NOTE — Therapy (Signed)
[facility] Outpatient Rehabilitation [facility] [address] [place], [place], [address] Phone: [phone]   Fax:  [phone]  Physical Therapy Treatment  Patient Details  Name: Roberta [name] MRN: [id number] Date of Birth: [birth year] Referring Provider (PT): Dr. Christie Beckers , Dr Merri Brunette   Encounter Date: 10/23/2020   PT End of Session - 10/23/20 0946     Visit Number 10    Number of Visits 16    Date for PT Re-Evaluation 12/04/20    Authorization Type [employer]    PT Start Time 0930    PT Stop Time 1015    PT Time Calculation (min) 45 min    Activity Tolerance Patient limited by fatigue;Patient tolerated treatment well;Patient limited by lethargy    Behavior During Therapy [facility] for tasks assessed/performed              Progress Note Reporting Period 08/14/20 to 10/23/20  See note below for Objective Data and Assessment of Progress/Goals.     History reviewed. No pertinent past medical history.  History reviewed. No pertinent surgical history.  There were no vitals filed for this visit.   Subjective Assessment - 10/23/20 0931     Subjective Got accomodations at work. Only in the NICU 1/2 week.  Then the other 1/2 she is in 1 ICU with adults.  Feels bad that everyone knows. As the day goes on she has knees ankles elbows. Not new for the past 1 month.    Currently in Pain? No/denies                 [facility] Adult PT Treatment/Exercise - 10/23/20 0001       Pilates   Pilates Tower see note      Lumbar Exercises: Standing   Heel Raises 20 reps    Heel Raises Limitations done with tennis ball for control and decreased ROM used mirror and added UE challenge and coordination , balance    Other Standing Lumbar Exercises hip abducton yellow band x 10 in standing with mirror              Pilates Tower for LE/Core strength, postural strength, lumbopelvic disassociation and core control.  Exercises included:  Tall kneeling slastix side  facing diagonal pull and flexion x 10 each   Cues for core engagement and avoiding trunk side flexion   Supine arm springs   Hooklying arm arcs x 10 with ball squeeze   Triceps x 10   Arcs with lower body bridge with ball , 5 sec hold    Roll down x 8, with cues for full spine extension in seated x 10      PT Short Term Goals - 10/09/20 1436       PT SHORT TERM GOAL #3   Title Pt will be able to report feeling less fatigue post work, able to do simple home tasks and not have to go right to bed.    Period Weeks    Status On-going               PT Long Term Goals - 10/09/20 1437       PT LONG TERM GOAL #1   Title Pt will be able to show Independence with HEP and consistent cardio routine    Period Weeks    Status On-going      PT LONG TERM GOAL #2   Title Patient will be able to squat, lift up to 25 lbs x 10  with no pain, just min fatigue    Period Weeks    Status On-going      PT LONG TERM GOAL #3   Title Pt will be able to see a NICU patient for 30 min in standing with min difficulty    Period Weeks    Status On-going      PT LONG TERM GOAL #4   Title Pt will be able to show 5/5 strength in core and LEs      PT LONG TERM GOAL #5   Title Pt will be able to show 5/5 strength in core and LEs    Period Weeks                   Plan - 10/23/20 0932     Clinical Impression Statement Patient cont to need questioning cues for reports of symptoms and pain. She has difficulty with maintaining single leg balance and core control, even with visual cues.  She is easily distracted and this pulls her body/trunk in that direction.  Will cont to progress as able.  She is pleased with her new work [employer] and finding she does have a bit more energy at the end of day.    PT Treatment/Interventions ADLs/Self Care Home Management;Moist Heat;Neuromuscular re-education;Balance training;Therapeutic exercise;Therapeutic activities;Manual techniques;Taping    PT Next  Visit Plan ? add to HEP for elevation change? standing, core as tolerated.  Monitor RPE and HR.  see's MD in 2 weeks, did she give them the list of accomodations?    PT Home Exercise Plan Access Code: [email or web address]  URL: [email or web address]  Date: 08/28/2020  Prepared by: Karie Mainland    Exercises  Supine 90/90 with Leg Extensions - 1 x daily - 7 x weekly - 2 sets - 10 reps - 5 hold  Supine Bridge - 1 x daily - 7 x weekly - 2 sets - 10 reps - 5 hold  Active Straight Leg Raise with Quad Set - 1 x daily - 7 x weekly - 2 sets - 10 reps - 5 hold  Prone Hip Extension - 1 x daily - 7 x weekly - 2 sets - 10 reps - 5 hold  Sidelying Hip Abduction - 1 x daily - 7 x weekly - 2 sets - 10 reps - 5 hold  Sidelying Hip Adduction - 1 x daily - 7 x weekly - 2 sets - 10 reps - 5 hold  Supine Shoulder Horizontal Abduction with Resistance - 1 x daily - 7 x weekly - 2 sets - 10 reps  Seated Shoulder Front Raise with Resistance - 1 x daily - 7 x weekly - 2 sets - 10 reps    Consulted and Agree with Plan of Care Patient             Patient will benefit from skilled therapeutic intervention in order to improve the following deficits and impairments:  Decreased balance, Decreased endurance, Decreased mobility, Difficulty walking, Postural dysfunction, Pain, Decreased strength, Decreased activity tolerance, Cardiopulmonary status limiting activity, Dizziness  Visit Diagnosis: Dizziness and giddiness  Muscle weakness (generalized)  Hypermobility syndrome     Problem List Patient Active Problem List   Diagnosis Date Noted   Generalized anxiety disorder 07/02/2020   Dry heaves 03/28/2020   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 [place], [place], [address] Phone: [phone]   Fax:  [phone]  Name: Roberta [name] MRN: [id number] Date of Birth: 1991/02/22  Karie Mainland, PT 10/23/20 1:19 PM Phone: [phone] Fax: [phone]

## 2020-10-23 NOTE — Progress Notes (Signed)
Roberta [name] [id number] Dec 30, 1990 30 y.o.  Subjective:   Patient ID:  Roberta [name] is a 30 y.o. (DOB [birth year]) female.  Chief Complaint: No chief complaint on file.   HPI  Accompanied by a friend - attends visit to help patient relay needs.  Roberta [name] presents to the office today for follow-up of MDD and GAD.  Describes mood today as "about the same". Pleasant. Tearful. Mood symptoms - reports depression, anxiety, and irritability. Feels like she is still "struggling" to move forward. Does not feel like current medication regimen is helpful to manage mood symptoms. Has tried various medications with minimal relief. Is willing to stop Zoloft and try Lexapro. Patient working through grief/loss issues. Has maintained work schedule. Working with PCP for medical issues. Varying interest and motivation. Taking medications as prescribed.  Energy levels lower - "not that great". Active, does not have a regular exercise routine.  Enjoys some usual interests and activities. Single. Lives alone. Family local. Spending time with family. Appetite adequate. Weight stable - 187 pounds. Sleeps well most nights. Averages 9 to 10 hours. Napping some days. Focus and concentration stable. Completing tasks. Managing aspects of household. Works full-time - 2 10 hour days. Works as a Human resources officer at [employer]. Denies SI or HI.  Denies AH or VH.   Previous medication trials: Wellbutrin - irritability, Pristiq, Buspar, Vistaril  Review of Systems:  Review of Systems  Musculoskeletal:  Negative for gait problem.  Neurological:  Negative for tremors.  Psychiatric/Behavioral:         Please refer to HPI   Medications: I have reviewed the patient's current medications.  Current Outpatient Medications  Medication Sig Dispense Refill   escitalopram (LEXAPRO) 20 MG tablet Take 1 tablet (20 mg total) by mouth daily. 90 tablet 1   atenolol (TENORMIN) 50 MG tablet Take 25 mg by mouth daily.   (Patient not taking: Reported on 08/14/2020)     busPIRone (BUSPAR) 10 MG tablet Take 1 tablet (10 mg total) by mouth 2 (two) times daily. 60 tablet 0   cloNIDine (CATAPRES) 0.1 MG tablet Take 5 mg by mouth daily.      EPINEPHrine 0.3 mg/0.3 mL IJ SOAJ injection as needed.     hydrOXYzine (VISTARIL) 25 MG capsule Take 1 capsule (25 mg total) by mouth 3 (three) times daily as needed for anxiety. 30 capsule 0   ivabradine (CORLANOR) 5 MG TABS tablet Take 5 mg by mouth 2 (two) times daily with a meal.     metoprolol succinate (TOPROL-XL) 50 MG 24 hr tablet Take 50 mg by mouth daily. Take with or immediately following a meal.     Norethindrone-Ethinyl Estradiol-Fe Biphas (LO LOESTRIN FE) 1 MG-10 MCG / 10 MCG tablet 1 tablet     octreotide (SANDOSTATIN LAR) 20 MG injection Inject 20 mg into the muscle every 28 (twenty-eight) days. Takes 3 x day before meals     promethazine (PHENERGAN) 12.5 MG tablet Take by mouth.     sertraline (ZOLOFT) 100 MG tablet Take two tablets daily. 60 tablet 5   No current facility-administered medications for this visit.    Medication Side Effects: None  Allergies:  Allergies  Allergen Reactions   Shellfish Allergy Anaphylaxis, Hives and Other (See Comments)   Shellfish-Derived Products     Other reaction(s): hives, Unknown   Penicillin G Rash    Other reaction(s): rash    No past medical history on file.  Past Medical History, Surgical history,  Social history, and Family history were reviewed and updated as appropriate.   Please see review of systems for further details on the patient's review from today.   Objective:   Physical Exam:  There were no vitals taken for this visit.  Physical Exam Constitutional:      General: She is not in acute distress. Musculoskeletal:        General: No deformity.  Neurological:     Mental Status: She is alert and oriented to person, place, and time.     Coordination: Coordination normal.  Psychiatric:         Attention and Perception: Attention and perception normal. She does not perceive auditory or visual hallucinations.        Mood and Affect: Mood normal. Mood is not anxious or depressed. Affect is not labile, blunt, angry or inappropriate.        Speech: Speech normal.        Behavior: Behavior normal.        Thought Content: Thought content normal. Thought content is not paranoid or delusional. Thought content does not include homicidal or suicidal ideation. Thought content does not include homicidal or suicidal plan.        Cognition and Memory: Cognition and memory normal.        Judgment: Judgment normal.     Comments: Insight intact    Lab Review:     Component Value Date/Time   NA 139 03/24/2011 0910   K 3.7 03/24/2011 0910   CL 104 03/24/2011 0910   CO2 24 03/24/2011 0910   GLUCOSE 83 03/24/2011 0910   BUN 10 03/24/2011 0910   CREATININE 0.69 03/24/2011 0910   CALCIUM 9.5 03/24/2011 0910   PROT 7.4 03/24/2011 0910   ALBUMIN 5.1 03/24/2011 0910   AST 25 03/24/2011 0910   ALT 20 03/24/2011 0910   ALKPHOS 38 (L) 03/24/2011 0910   BILITOT 1.0 03/24/2011 0910       Component Value Date/Time   WBC 6.6 03/24/2011 0910   RBC 5.16 03/24/2011 0910   HGB 15.6 03/24/2011 0910   HCT 43.0 03/24/2011 0910   PLT 254 03/24/2011 0910   MCV 83.3 03/24/2011 0910   MCH 30.2 03/24/2011 0910   MCHC 36.3 (H) 03/24/2011 0910   RDW 12.6 03/24/2011 0910   LYMPHSABS 2.7 03/24/2011 0910   MONOABS 0.4 03/24/2011 0910   EOSABS 0.2 03/24/2011 0910   BASOSABS 0.0 03/24/2011 0910    No results found for: POCLITH, LITHIUM   No results found for: PHENYTOIN, PHENOBARB, VALPROATE, CBMZ   .res Assessment: Plan:     Plan:  PDMP reviewed  D/C Zoloft 100mg  daily - 1/2 tablet daily x 7 days, then d/c Add Lexapro 20mg  daily - 1/2 tablet daily x 7 days, then increase to 20mg  daily  RTC 4 weeks  Patient advised to contact office with any questions, adverse effects, or acute worsening in signs  and symptoms.   Diagnoses and all orders for this visit:  Major depressive disorder, recurrent episode, moderate (HCC)  Generalized anxiety disorder  Other orders -     escitalopram (LEXAPRO) 20 MG tablet; Take 1 tablet (20 mg total) by mouth daily.    Please see After Visit Summary for patient specific instructions.  Future Appointments  Date Time Provider Department Center  11/08/2020  9:00 AM [facility]  11/12/2020 11:00 AM [facility]  11/20/2020 11:00 AM [facility], PT [facility]  11/20/2020  5:00 PM Dayami Taitt, Thereasa Solo, NP [facility] None  11/27/2020 11:00 AM [place], Lise Auer, PT [facility]    No orders of the defined types were placed in this encounter.   -------------------------------

## 2020-10-24 ENCOUNTER — Encounter: Payer: Self-pay | Admitting: Adult Health

## 2020-10-27 ENCOUNTER — Telehealth: Payer: Self-pay

## 2020-10-27 NOTE — Telephone Encounter (Signed)
Received call from pt's friend, Netta Corrigan. She is on pt's consents. She reports Dasiah wasn't having a good day at her last visit on Thursday with Gina and didn't give her all the information she needed. She reports she does not want to stop Pristiq because she reports feeling "happiness" while taking it. She would prefer to continue instead of switching to Lexapro. She is also requesting to stop her Zoloft 100 mg? She is wondering if the Pristiq will be enough to help her. Asking how to taper off?

## 2020-10-28 NOTE — Telephone Encounter (Signed)
Ok to continue Pristiq for now. Zoloft can be tapered off.

## 2020-10-28 NOTE — Telephone Encounter (Signed)
Left detailed message with Missy and to call back with further questions.

## 2020-10-31 NOTE — Telephone Encounter (Signed)
FYI, Pt's friend Netta Corrigan called back with an update that pt has decided to stay on Pristiq 100 mg and Zoloft 100 mg, she won't taper off at this time. Also continuing Buspar bid.   ADM: she is also requesting to change her apt from 11/20/2020 to 11/19/2020 between 3-4 pm in person visit, if someone can call her to change apt. Missy's # is [phone]

## 2020-11-01 NOTE — Telephone Encounter (Signed)
Noted  

## 2020-11-04 ENCOUNTER — Other Ambulatory Visit: Payer: Self-pay | Admitting: Gastroenterology

## 2020-11-06 ENCOUNTER — Other Ambulatory Visit: Payer: Self-pay

## 2020-11-06 ENCOUNTER — Encounter: Payer: Self-pay | Admitting: Physical Therapy

## 2020-11-06 ENCOUNTER — Ambulatory Visit: Payer: Managed Care, Other (non HMO) | Admitting: Physical Therapy

## 2020-11-06 VITALS — BP 134/82 | HR 77

## 2020-11-06 DIAGNOSIS — M357 Hypermobility syndrome: Secondary | ICD-10-CM

## 2020-11-06 DIAGNOSIS — M6281 Muscle weakness (generalized): Secondary | ICD-10-CM

## 2020-11-06 DIAGNOSIS — I498 Other specified cardiac arrhythmias: Secondary | ICD-10-CM

## 2020-11-06 DIAGNOSIS — R42 Dizziness and giddiness: Secondary | ICD-10-CM | POA: Diagnosis not present

## 2020-11-06 DIAGNOSIS — G90A Postural orthostatic tachycardia syndrome (POTS): Secondary | ICD-10-CM

## 2020-11-06 NOTE — Therapy (Signed)
[facility] Outpatient Rehabilitation [facility] [address] [place], [place], [address] Phone: [phone]   Fax:  [phone]  Physical Therapy Treatment  Patient Details  Name: Roberta [name] MRN: [id number] Date of Birth: 20-Apr-1990 Referring Provider (PT): Dr. Christie Beckers , Dr Merri Brunette   Encounter Date: 11/06/2020   PT End of Session - 11/06/20 0928     Visit Number 11    Number of Visits 16    Date for PT Re-Evaluation 12/04/20    Authorization Type [employer]    PT Start Time 0928    PT Stop Time 1014    PT Time Calculation (min) 46 min    Activity Tolerance Patient limited by fatigue;Patient tolerated treatment well;Patient limited by lethargy    Behavior During Therapy [facility] for tasks assessed/performed             History reviewed. No pertinent past medical history.  History reviewed. No pertinent surgical history.  Vitals:   11/06/20 0934 11/06/20 0938 11/06/20 1013  BP: (!) 132/100 121/86 134/82  Pulse: 77  77  SpO2: 97%  97%     Subjective Assessment - 11/06/20 0928     Subjective "been seeing babies in the nicu, been doing pretty good. I may have fallen last week down 10 stairs, this occured as a result of losin gbalance down the steps." pt reprots feeling sore after the fall, but didn't go to the doctor or urgent care. After digging pt notes not feeling too good, being tired and continued SOB, HA.    Patient Stated Goals Patientr would like to move and not be so out of breath    Currently in Pain? No/denies    Pain Type Chronic pain    Pain Onset More than a month ago                [facility] PT Assessment - 11/06/20 0001       Assessment   Medical Diagnosis [name] , arrhythmia    Referring Provider (PT) Dr. Christie Beckers , Dr Merri Brunette                           [facility] Adult PT Treatment/Exercise - 11/06/20 0001       Lumbar Exercises: Aerobic   Elliptical L1 x 5 min L1 ramp   RPE between 13  and 15  throughout session.     Lumbar Exercises: Standing   Heel Raises 20 reps    Heel Raises Limitations with ball between knees, and verbal cues to keep knee slightly bent during exercise   RPE 13-15   Other Standing Lumbar Exercises hip abduction bil 2 x 10 2#   tactile cues to reduce trunk lean compensation   Other Standing Lumbar Exercises standing marching 2 x 10 alternating L/R   toe tapping on to 8 in step     Lumbar Exercises: Seated   Sit to Stand 10 reps   1 x 10 from hi-lo table, 1 x 10 5#, 1 x 10 10# kettlebell   Sit to Stand Limitations RPE stayed between 13 -15      Knee/Hip Exercises: Seated   Long Arc Quad 2 sets;Both;Strengthening;20 reps   2#   Long Arc Quad Limitations RPE between 13-15                      PT Short Term Goals - 10/09/20 1436  PT SHORT TERM GOAL #3   Title Pt will be able to report feeling less fatigue post work, able to do simple home tasks and not have to go right to bed.    Period Weeks    Status On-going               PT Long Term Goals - 10/09/20 1437       PT LONG TERM GOAL #1   Title Pt will be able to show Independence with HEP and consistent cardio routine    Period Weeks    Status On-going      PT LONG TERM GOAL #2   Title Patient will be able to squat, lift up to 25 lbs x 10 with no pain, just min fatigue    Period Weeks    Status On-going      PT LONG TERM GOAL #3   Title Pt will be able to see a NICU patient for 30 min in standing with min difficulty    Period Weeks    Status On-going      PT LONG TERM GOAL #4   Title Pt will be able to show 5/5 strength in core and LEs      PT LONG TERM GOAL #5   Title Pt will be able to show 5/5 strength in core and LEs    Period Weeks                   Plan - 11/06/20 1022     Clinical Impression Statement pt continues to requring question to acquire her current symptoms which she states " I hate to complain". She did report falling since the last  session down 10 steps stating she felt sore but didn't feel he needed to go to her MD. continued working on gross LE strength with increased reps to promote endurance. She stayed between RPE of 13-15 throughout session. As she fatigues she exhibits increased posterior trunk lean which she is able to correct with cues.    PT Treatment/Interventions ADLs/Self Care Home Management;Moist Heat;Neuromuscular re-education;Balance training;Therapeutic exercise;Therapeutic activities;Manual techniques;Taping    PT Next Visit Plan ? add to HEP for elevation change? standing, core as tolerated.  Monitor RPE and HR.  see's MD in 2 weeks,    Consulted and Agree with Plan of Care Patient             Patient will benefit from skilled therapeutic intervention in order to improve the following deficits and impairments:  Decreased balance, Decreased endurance, Decreased mobility, Difficulty walking, Postural dysfunction, Pain, Decreased strength, Decreased activity tolerance, Cardiopulmonary status limiting activity, Dizziness  Visit Diagnosis: Dizziness and giddiness  Muscle weakness (generalized)  Hypermobility syndrome  POTS (postural orthostatic tachycardia syndrome)     Problem List Patient Active Problem List   Diagnosis Date Noted   Generalized anxiety disorder 07/02/2020   Dry heaves 03/28/2020   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 [place], [place], [address] Phone: [phone]   Fax:   [phone]  Name: Zetta Stoneman  Hendrie MRN: [id number] Date of Birth: Jan 06, 1991

## 2020-11-08 ENCOUNTER — Encounter: Payer: Self-pay | Admitting: Physical Therapy

## 2020-11-08 ENCOUNTER — Other Ambulatory Visit: Payer: Self-pay

## 2020-11-08 ENCOUNTER — Ambulatory Visit: Payer: Managed Care, Other (non HMO) | Admitting: Physical Therapy

## 2020-11-08 VITALS — BP 138/102 | HR 73

## 2020-11-08 DIAGNOSIS — M357 Hypermobility syndrome: Secondary | ICD-10-CM

## 2020-11-08 DIAGNOSIS — R42 Dizziness and giddiness: Secondary | ICD-10-CM

## 2020-11-08 DIAGNOSIS — G90A Postural orthostatic tachycardia syndrome (POTS): Secondary | ICD-10-CM

## 2020-11-08 DIAGNOSIS — I498 Other specified cardiac arrhythmias: Secondary | ICD-10-CM

## 2020-11-08 DIAGNOSIS — M6281 Muscle weakness (generalized): Secondary | ICD-10-CM

## 2020-11-08 NOTE — Therapy (Signed)
[facility] Outpatient Rehabilitation [facility] [address] [place], [place], [address] Phone: [phone]   Fax:  [phone]  Physical Therapy Treatment  Patient Details  Name: Roberta [name] MRN: [id number] Date of Birth: 1990-11-11 Referring Provider (PT): Dr. Christie Beckers , Dr Merri Brunette   Encounter Date: 11/08/2020   PT End of Session - 11/08/20 0904     Visit Number 12    Number of Visits 16    Date for PT Re-Evaluation 12/04/20    Authorization Type [employer]    PT Start Time 0902    PT Stop Time 0945    PT Time Calculation (min) 43 min    Activity Tolerance Patient limited by fatigue;Patient tolerated treatment well;Patient limited by lethargy    Behavior During Therapy [facility] for tasks assessed/performed             History reviewed. No pertinent past medical history.  History reviewed. No pertinent surgical history.  Vitals:   11/08/20 0905 11/08/20 0911 11/08/20 0940  BP: (!) 138/103 (!) 129/105 (!) 138/102  Pulse: 73  73  SpO2: 98%  99%     Subjective Assessment - 11/08/20 0904     Subjective "I feeling good, I felt pretty good after the last session."    Patient Stated Goals Patientr would like to move and not be so out of breath    Currently in Pain? No/denies                                         PT Short Term Goals - 10/09/20 1436       PT SHORT TERM GOAL #3   Title Pt will be able to report feeling less fatigue post work, able to do simple home tasks and not have to go right to bed.    Period Weeks    Status On-going               PT Long Term Goals - 10/09/20 1437       PT LONG TERM GOAL #1   Title Pt will be able to show Independence with HEP and consistent cardio routine    Period Weeks    Status On-going      PT LONG TERM GOAL #2   Title Patient will be able to squat, lift up to 25 lbs x 10 with no pain, just min fatigue    Period Weeks    Status On-going      PT LONG TERM  GOAL #3   Title Pt will be able to see a NICU patient for 30 min in standing with min difficulty    Period Weeks    Status On-going      PT LONG TERM GOAL #4   Title Pt will be able to show 5/5 strength in core and LEs      PT LONG TERM GOAL #5   Title Pt will be able to show 5/5 strength in core and LEs    Period Weeks               [facility] Adult PT Treatment/Exercise:  Therapeutic Exercise: Supine scapular retraction with double ER 2 x 12 with GTB (RPE 13) Supine horizontal abduction 2 x 10 RTB  (cues to touch band to chest in the same place every set to focues on stabilty, she demosntrated difficulty with)(RPE 15) Supine PNF D2  bil 2 x 10 with with YTB - demonstration for proper form (Held exercise for L UE due to being unable to perform exercise with LUE secondary to pain in the triceps which had occurred from fall that was reported last session.) RPE 15 Alternating L/R tabletop pos holding in the in the flexed position x  1 sec keeping core tight throughout exercise 1 x 10 ( verbal cues to breathing during exercise) RPE 17 Modified to alternating bent knee raises 1 x 10 due to report dizziness and SOB Bridge with clamshell green theraband 2 x 10        Plan - 11/08/20 0921     Clinical Impression Statement pt arrives to session today noting she is feeling good, upon assess BP her diastolic measured 103 and 105 today due to elevated diastolic values focus strengtheing in supine positioning. during tabletop exercise pt did  RPEnot feeling SOB and dizzy, provided extended rest break before resuming exercise. pt's RPE remeained between 13 - 17 today, and diastolic was 103 end of session.    Examination-Participation Restrictions Cleaning;Shop;Community Activity;Occupation;Interpersonal Relationship    PT Treatment/Interventions ADLs/Self Care Home Management;Moist Heat;Neuromuscular re-education;Balance training;Therapeutic exercise;Therapeutic activities;Manual techniques;Taping     PT Next Visit Plan ? add to HEP for elevation change? standing, core as tolerated.  Monitor RPE and HR.  see's MD in 2 weeks,    Consulted and Agree with Plan of Care Patient             Patient will benefit from skilled therapeutic intervention in order to improve the following deficits and impairments:  Decreased balance, Decreased endurance, Decreased mobility, Difficulty walking, Postural dysfunction, Pain, Decreased strength, Decreased activity tolerance, Cardiopulmonary status limiting activity, Dizziness  Visit Diagnosis: Dizziness and giddiness  Muscle weakness (generalized)  Hypermobility syndrome  POTS (postural orthostatic tachycardia syndrome)     Problem List Patient Active Problem List   Diagnosis Date Noted   Generalized anxiety disorder 07/02/2020   Dry heaves 03/28/2020   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 [place], [place], [address] Phone: [phone]   Fax:  [phone]  Name: Roberta [name] MRN: [id number] Date of Birth: [birth year]

## 2020-11-12 ENCOUNTER — Other Ambulatory Visit: Payer: Self-pay

## 2020-11-12 ENCOUNTER — Ambulatory Visit: Payer: Managed Care, Other (non HMO) | Admitting: Physical Therapy

## 2020-11-12 ENCOUNTER — Encounter: Payer: Self-pay | Admitting: Physical Therapy

## 2020-11-12 VITALS — BP 146/84 | HR 91

## 2020-11-12 DIAGNOSIS — R42 Dizziness and giddiness: Secondary | ICD-10-CM | POA: Diagnosis not present

## 2020-11-12 DIAGNOSIS — G90A Postural orthostatic tachycardia syndrome (POTS): Secondary | ICD-10-CM

## 2020-11-12 DIAGNOSIS — I498 Other specified cardiac arrhythmias: Secondary | ICD-10-CM

## 2020-11-12 DIAGNOSIS — M6281 Muscle weakness (generalized): Secondary | ICD-10-CM

## 2020-11-12 DIAGNOSIS — M357 Hypermobility syndrome: Secondary | ICD-10-CM

## 2020-11-12 NOTE — Therapy (Signed)
[facility] Outpatient Rehabilitation [facility] [address] [place], [place], [address] Phone: [phone]   Fax:  [phone]  Physical Therapy Treatment  Patient Details  Name: Roberta [name] MRN: [id number] Date of Birth: March 12, 1991 Referring Provider (PT): Dr. Christie Beckers , Dr Merri Brunette   Encounter Date: 11/12/2020   PT End of Session - 11/12/20 1102     Visit Number 13    Number of Visits 16    Date for PT Re-Evaluation 12/04/20    Authorization Type [employer]    PT Start Time 1102    PT Stop Time 1142    PT Time Calculation (min) 40 min    Activity Tolerance Patient limited by fatigue;Patient tolerated treatment well;Patient limited by lethargy    Behavior During Therapy Procedure [facility] for tasks assessed/performed             History reviewed. No pertinent past medical history.  History reviewed. No pertinent surgical history.  Vitals:   11/12/20 1106 11/12/20 1137  BP: (!) 144/93 (!) 146/84  Pulse: (!) 51 91  SpO2: 98% 98%     Subjective Assessment - 11/12/20 1104     Subjective " I am feeling alot weaker and short of breath. they aren't sure and they got blood work done before our appointment today.    Patient Stated Goals Patientr would like to move and not be so out of breath    Currently in Pain? No/denies    Pain Orientation Right;Left                      [facility] Adult PT Treatment/Exercise:  Therapeutic Exercise: Recumbent bike L2 x 6 min  leaning back x 3 min, setting seat more upright x 3 min HR stayed between 90-107 RPE 15 Standing Rows 3 x 10 GTB RPE 13 Standing shoulder extension 2 x 10 GTB RPE 16 Cues for standing position, and proper form as well as breathing Sustained shoulder extension marching 1 x 10  Sustained shoulder extension 3 x 1 min with RTB cues to maintain balance and breathing RPE 15 Sustained shoulder extension 3 x 5 marching RPE 16 Bridge with calves on red physioball 1 x 10 RPE 15 Cues for breathing in  with extension and out with she released                       PT Education - 11/12/20 1144     Education Details trialing doing some of her exercises in sitting vs supine at home. and gradually increasing the time she spends sitting.    Person(s) Educated Patient    Methods Explanation;Verbal cues;Handout    Comprehension Verbalized understanding;Verbal cues required              PT Short Term Goals - 10/09/20 1436       PT SHORT TERM GOAL #3   Title Pt will be able to report feeling less fatigue post work, able to do simple home tasks and not have to go right to bed.    Period Weeks    Status On-going               PT Long Term Goals - 10/09/20 1437       PT LONG TERM GOAL #1   Title Pt will be able to show Independence with HEP and consistent cardio routine    Period Weeks    Status On-going      PT LONG  TERM GOAL #2   Title Patient will be able to squat, lift up to 25 lbs x 10 with no pain, just min fatigue    Period Weeks    Status On-going      PT LONG TERM GOAL #3   Title Pt will be able to see a NICU patient for 30 min in standing with min difficulty    Period Weeks    Status On-going      PT LONG TERM GOAL #4   Title Pt will be able to show 5/5 strength in core and LEs      PT LONG TERM GOAL #5   Title Pt will be able to show 5/5 strength in core and LEs    Period Weeks                   Plan - 11/12/20 1136     Clinical Impression Statement pt arrives today reporting she is feeling increasingly out of breath and fatigued. today her diastolic remained below 100 so worked on standing shoulder and core activation in standing, Her RPE stayed between 13 -16. She does requiring additional question to get how she is truly feeling. worked on trying to recover in standing/ sitting, and to work on trying to sit up at home vs staying supine all day.    PT Treatment/Interventions ADLs/Self Care Home Management;Moist  Heat;Neuromuscular re-education;Balance training;Therapeutic exercise;Therapeutic activities;Manual techniques;Taping    PT Next Visit Plan ? add to HEP for elevation change? standing, core as tolerated.  Monitor RPE and HR.  did she trial doing more sitting at home and doing her HEP at home.    PT Home Exercise Plan Access Code: [email or web address]  URL: [email or web address]  Date: 08/28/2020  Prepared by: Karie Mainland    Exercises  Supine 90/90 with Leg Extensions - 1 x daily - 7 x weekly - 2 sets - 10 reps - 5 hold  Supine Bridge - 1 x daily - 7 x weekly - 2 sets - 10 reps - 5 hold  Active Straight Leg Raise with Quad Set - 1 x daily - 7 x weekly - 2 sets - 10 reps - 5 hold  Prone Hip Extension - 1 x daily - 7 x weekly - 2 sets - 10 reps - 5 hold  Sidelying Hip Abduction - 1 x daily - 7 x weekly - 2 sets - 10 reps - 5 hold  Sidelying Hip Adduction - 1 x daily - 7 x weekly - 2 sets - 10 reps - 5 hold  Supine Shoulder Horizontal Abduction with Resistance - 1 x daily - 7 x weekly - 2 sets - 10 reps  Seated Shoulder Front Raise with Resistance - 1 x daily - 7 x weekly - 2 sets - 10 reps    Consulted and Agree with Plan of Care Patient             Patient will benefit from skilled therapeutic intervention in order to improve the following deficits and impairments:  Decreased balance, Decreased endurance, Decreased mobility, Difficulty walking, Postural dysfunction, Pain, Decreased strength, Decreased activity tolerance, Cardiopulmonary status limiting activity, Dizziness  Visit Diagnosis: Dizziness and giddiness  Muscle weakness (generalized)  Hypermobility syndrome  POTS (postural orthostatic tachycardia syndrome)     Problem List Patient Active Problem List   Diagnosis Date Noted   Generalized anxiety disorder 07/02/2020   Dry heaves 03/28/2020   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, involving the cardia 03/28/2020  Nausea and vomiting 03/28/2020   Ehlers-Danlos  disease 11/22/2019   Chronic eczematous otitis externa of both ears 12/07/2018   Excessive cerumen in both ear canals 12/07/2018   POTS (postural orthostatic tachycardia syndrome) 11/04/2014   Benign essential hypertension 10/10/2014   Lymphadenopathy 02/10/2011    Lulu Riding PT, DPT, LAT, ATC  11/12/20  11:47 AM      [facility] Outpatient Rehabilitation [facility] [address] [place], [place], [address] Phone: [phone]   Fax:  [phone]  Name: Roberta [name] MRN: [id number] Date of Birth: 1990-10-28

## 2020-11-13 ENCOUNTER — Encounter (HOSPITAL_COMMUNITY): Payer: Self-pay | Admitting: Gastroenterology

## 2020-11-13 ENCOUNTER — Other Ambulatory Visit: Payer: Self-pay

## 2020-11-19 ENCOUNTER — Encounter: Payer: Self-pay | Admitting: Physical Therapy

## 2020-11-19 ENCOUNTER — Ambulatory Visit: Payer: Managed Care, Other (non HMO) | Admitting: Physical Therapy

## 2020-11-19 ENCOUNTER — Other Ambulatory Visit: Payer: Self-pay

## 2020-11-19 VITALS — BP 113/99 | HR 62

## 2020-11-19 DIAGNOSIS — I498 Other specified cardiac arrhythmias: Secondary | ICD-10-CM

## 2020-11-19 DIAGNOSIS — R42 Dizziness and giddiness: Secondary | ICD-10-CM | POA: Diagnosis not present

## 2020-11-19 DIAGNOSIS — M6281 Muscle weakness (generalized): Secondary | ICD-10-CM

## 2020-11-19 DIAGNOSIS — G90A Postural orthostatic tachycardia syndrome (POTS): Secondary | ICD-10-CM

## 2020-11-19 DIAGNOSIS — M357 Hypermobility syndrome: Secondary | ICD-10-CM

## 2020-11-19 NOTE — Therapy (Signed)
[facility] Outpatient Rehabilitation [facility] [address] [place], [place], [address] Phone: [phone]   Fax:  [phone]  Physical Therapy Treatment  Patient Details  Name: Roberta [name] MRN: [id number] Date of Birth: Apr 07, 1990 Referring Provider (PT): Dr. Christie Beckers , Dr Merri Brunette   Encounter Date: 11/19/2020   PT End of Session - 11/19/20 0851     Visit Number 14    Number of Visits 16    Date for PT Re-Evaluation 12/04/20    Authorization Type [employer]    PT Start Time 0845    PT Stop Time 0930    PT Time Calculation (min) 45 min             Past Medical History:  Diagnosis Date   Anxiety    Depression    Dumping syndrome    Ehlers-Danlos syndrome    Postural orthostatic tachycardia syndrome     Past Surgical History:  Procedure Laterality Date   INNER EAR SURGERY     UPPER GI ENDOSCOPY      Vitals:   11/19/20 0846  BP: (!) 113/99  Pulse: 62     Subjective Assessment - 11/19/20 0846     Subjective I feel good today.  Still feel short of breath at times .  EGD with surgical biopsy tomorrow for precancerous cells found.    Currently in Pain? Yes    Pain Score 3     Pain Location Knee    Pain Orientation Right;Left    Pain Descriptors / Indicators Aching    Pain Type Chronic pain    Pain Onset More than a month ago    Pain Frequency Intermittent    Aggravating Factors  walking, squatting    Pain Relieving Factors rest    Multiple Pain Sites No                   [facility] Adult PT Treatment/Exercise:  Therapeutic Exercise: - Recumbant bike 5 min L1   -Standing TRX  Squats 30 sec on : 30 sec off x 5 rounds added heel raise last set   BP 125/85, HR up to 109 during TRX   Lunge: Static x 3 each side  30 sec , Dynamic x 1 rounds x10 reps each side (SLB)    Curtsy: x 15 each LE rest breaks in between sets   Plank on mat table 30 sec  shoulder taps x 30 sec hip ext x 30 sec  RPE 16 HR 127 bpm 142/87  BP       PT Short Term Goals - 11/19/20 0240       PT SHORT TERM GOAL #1   Title Pt will be consistent and I with HEP for core, large muscle group strengthening    Status Achieved      PT SHORT TERM GOAL #2   Title Pt will use RPE and HR monitor (Fitbit) to monitor self while doing cardio    Status Achieved      PT SHORT TERM GOAL #3   Title Pt will be able to report feeling less fatigue post work, able to do simple home tasks and not have to go right to bed.    Status On-going               PT Long Term Goals - 11/19/20 0853       PT LONG TERM GOAL #1   Title Pt will be able to show Independence with HEP  and consistent cardio routine    Status On-going      PT LONG TERM GOAL #2   Title Patient will be able to squat, lift up to 25 lbs x 10 with no pain, just min fatigue    Baseline knee pain limits squating    Status On-going      PT LONG TERM GOAL #3   Title Pt will be able to see a NICU patient for 30 min in standing with min difficulty    Status On-going      PT LONG TERM GOAL #4   Title Pt will be able to show 5/5 strength in core and LEs    Status On-going                   Plan - 11/19/20 0865     Clinical Impression Statement Patient did very well today used TRX to challenge balance and endurance.  max RPE 16 and R up to 127.  DBP improved with exercise.  Understandably nervous about her procedure tomorrow. Will follow up next week and cont POC as able.    PT Treatment/Interventions ADLs/Self Care Home Management;Moist Heat;Neuromuscular re-education;Balance training;Therapeutic exercise;Therapeutic activities;Manual techniques;Taping    PT Next Visit Plan ?how was procedure  add to HEP for elevation change? standing, core as tolerated.  Monitor RPE and HR.  did she trial doing more sitting at home and doing her HEP at home.    PT Home Exercise Plan Access Code: [email or web address]  URL: [email or web address]  Date: 08/28/2020  Prepared by:  Karie Mainland    Exercises  Supine 90/90 with Leg Extensions - 1 x daily - 7 x weekly - 2 sets - 10 reps - 5 hold  Supine Bridge - 1 x daily - 7 x weekly - 2 sets - 10 reps - 5 hold  Active Straight Leg Raise with Quad Set - 1 x daily - 7 x weekly - 2 sets - 10 reps - 5 hold  Prone Hip Extension - 1 x daily - 7 x weekly - 2 sets - 10 reps - 5 hold  Sidelying Hip Abduction - 1 x daily - 7 x weekly - 2 sets - 10 reps - 5 hold  Sidelying Hip Adduction - 1 x daily - 7 x weekly - 2 sets - 10 reps - 5 hold  Supine Shoulder Horizontal Abduction with Resistance - 1 x daily - 7 x weekly - 2 sets - 10 reps  Seated Shoulder Front Raise with Resistance - 1 x daily - 7 x weekly - 2 sets - 10 reps    Consulted and Agree with Plan of Care Patient             Patient will benefit from skilled therapeutic intervention in order to improve the following deficits and impairments:  Decreased balance, Decreased endurance, Decreased mobility, Difficulty walking, Postural dysfunction, Pain, Decreased strength, Decreased activity tolerance, Cardiopulmonary status limiting activity, Dizziness  Visit Diagnosis: Dizziness and giddiness  Hypermobility syndrome  POTS (postural orthostatic tachycardia syndrome)  Muscle weakness (generalized)     Problem List Patient Active Problem List   Diagnosis Date Noted   Generalized anxiety disorder 07/02/2020   Dry heaves 03/28/2020   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, involving the cardia 03/28/2020   Nausea and vomiting 03/28/2020   Ehlers-Danlos disease 11/22/2019   Chronic eczematous otitis externa of both ears 12/07/2018   Excessive cerumen in both ear canals 12/07/2018   POTS (postural orthostatic tachycardia syndrome)  11/04/2014   Benign essential hypertension 10/10/2014   Lymphadenopathy 02/10/2011    Roberta [name] 11/19/2020, 9:38 AM  [facility] [address] [place], [place], [address] Phone:  [phone]   Fax:  [phone]  Name: Roberta [name] MRN: [id number] Date of Birth: [birth year]  Karie Mainland, PT 11/19/20 9:38 AM Phone: [phone] Fax: [phone]

## 2020-11-20 ENCOUNTER — Encounter (HOSPITAL_COMMUNITY): Payer: Self-pay | Admitting: Gastroenterology

## 2020-11-20 ENCOUNTER — Encounter (HOSPITAL_COMMUNITY)
Admission: RE | Disposition: A | Discharge status: Home / Self Care | Payer: Self-pay | Source: Home / Self Care | Attending: Gastroenterology

## 2020-11-20 ENCOUNTER — Ambulatory Visit: Payer: 59 | Admitting: Adult Health

## 2020-11-20 ENCOUNTER — Ambulatory Visit (HOSPITAL_COMMUNITY)
Admission: RE | Admit: 2020-11-20 | Discharge: 2020-11-20 | Disposition: A | Discharge status: Home / Self Care | Payer: Managed Care, Other (non HMO) | Attending: Gastroenterology | Admitting: Gastroenterology

## 2020-11-20 ENCOUNTER — Ambulatory Visit (HOSPITAL_COMMUNITY): Payer: Managed Care, Other (non HMO) | Admitting: Registered Nurse

## 2020-11-20 ENCOUNTER — Encounter: Payer: Managed Care, Other (non HMO) | Admitting: Physical Therapy

## 2020-11-20 ENCOUNTER — Other Ambulatory Visit: Payer: Self-pay

## 2020-11-20 DIAGNOSIS — Q796 Ehlers-Danlos syndrome, unspecified: Secondary | ICD-10-CM | POA: Diagnosis present

## 2020-11-20 DIAGNOSIS — Z09 Encounter for follow-up examination after completed treatment for conditions other than malignant neoplasm: Secondary | ICD-10-CM | POA: Diagnosis not present

## 2020-11-20 DIAGNOSIS — Z79899 Other long term (current) drug therapy: Secondary | ICD-10-CM | POA: Diagnosis not present

## 2020-11-20 DIAGNOSIS — Z91013 Allergy to seafood: Secondary | ICD-10-CM | POA: Diagnosis not present

## 2020-11-20 DIAGNOSIS — R112 Nausea with vomiting, unspecified: Secondary | ICD-10-CM | POA: Insufficient documentation

## 2020-11-20 DIAGNOSIS — Z807 Family history of other malignant neoplasms of lymphoid, hematopoietic and related tissues: Secondary | ICD-10-CM | POA: Insufficient documentation

## 2020-11-20 DIAGNOSIS — K317 Polyp of stomach and duodenum: Secondary | ICD-10-CM | POA: Insufficient documentation

## 2020-11-20 DIAGNOSIS — Z880 Allergy status to penicillin: Secondary | ICD-10-CM | POA: Diagnosis not present

## 2020-11-20 DIAGNOSIS — K319 Disease of stomach and duodenum, unspecified: Secondary | ICD-10-CM | POA: Insufficient documentation

## 2020-11-20 HISTORY — DX: Anxiety disorder, unspecified: F41.9

## 2020-11-20 HISTORY — DX: Depression, unspecified: F32.A

## 2020-11-20 HISTORY — PX: ESOPHAGOGASTRODUODENOSCOPY: SHX5428

## 2020-11-20 HISTORY — DX: Ehlers-Danlos syndrome, unspecified: Q79.60

## 2020-11-20 HISTORY — DX: Postgastric surgery syndromes: K91.1

## 2020-11-20 HISTORY — DX: Postural orthostatic tachycardia syndrome (POTS): G90.A

## 2020-11-20 HISTORY — DX: Other specified cardiac arrhythmias: I49.8

## 2020-11-20 HISTORY — PX: BIOPSY: SHX5522

## 2020-11-20 SURGERY — EGD (ESOPHAGOGASTRODUODENOSCOPY)
Anesthesia: Monitor Anesthesia Care

## 2020-11-20 MED ORDER — ONDANSETRON HCL 4 MG/2ML IJ SOLN
INTRAMUSCULAR | Status: DC | PRN
  Administered 2020-11-20: 4 mg via INTRAVENOUS

## 2020-11-20 MED ORDER — LIDOCAINE HCL (CARDIAC) PF 100 MG/5ML IV SOSY
PREFILLED_SYRINGE | INTRAVENOUS | Status: DC | PRN
  Administered 2020-11-20: 75 mg via INTRAVENOUS

## 2020-11-20 MED ORDER — PROPOFOL 10 MG/ML IV BOLUS
INTRAVENOUS | Status: DC | PRN
Start: 2020-11-20 — End: 2020-11-20

## 2020-11-20 MED ORDER — DEXAMETHASONE SODIUM PHOSPHATE 10 MG/ML IJ SOLN
INTRAMUSCULAR | Status: DC | PRN
  Administered 2020-11-20: 10 mg via INTRAVENOUS

## 2020-11-20 MED ORDER — PROPOFOL 500 MG/50ML IV EMUL
INTRAVENOUS | Status: DC | PRN
  Administered 2020-11-20: 125 ug/kg/min via INTRAVENOUS

## 2020-11-20 MED ORDER — LACTATED RINGERS IV SOLN: INTRAVENOUS | Status: DC

## 2020-11-20 MED ORDER — PROPOFOL 10 MG/ML IV BOLUS
INTRAVENOUS | Status: DC | PRN
  Administered 2020-11-20 (×2): 50 mg via INTRAVENOUS
  Administered 2020-11-20: 20 mg via INTRAVENOUS
  Administered 2020-11-20: 50 mg via INTRAVENOUS

## 2020-11-20 MED ORDER — SCOPOLAMINE 1 MG/3DAYS TD PT72
MEDICATED_PATCH | TRANSDERMAL | Status: AC
  Filled 2020-11-20: qty 1

## 2020-11-20 MED ORDER — SCOPOLAMINE 1 MG/3DAYS TD PT72
MEDICATED_PATCH | TRANSDERMAL | Status: DC | PRN
  Administered 2020-11-20: 1 via TRANSDERMAL

## 2020-11-20 NOTE — Discharge Instructions (Signed)

## 2020-11-20 NOTE — Transfer of Care (Signed)
Immediate Anesthesia Transfer of Care Note  Patient: Roberta [name]  Procedure(s) Performed: ESOPHAGOGASTRODUODENOSCOPY (EGD) BIOPSY  Patient Location: PACU  Anesthesia Type:MAC  Level of Consciousness: awake, sedated and patient cooperative  Airway & Oxygen Therapy: Patient Spontanous Breathing and Patient connected to face mask oxygen  Post-op Assessment: Report given to RN, Post -op Vital signs reviewed and stable and Patient moving all extremities X 4  Post vital signs: stable  Last Vitals:  Vitals Value Taken Time  BP 126/70 11/20/20 1116  Temp    Pulse 80 11/20/20 1119  Resp 19 11/20/20 1119  SpO2 100 % 11/20/20 1119  Vitals shown include unvalidated device data.  Last Pain:  Vitals:   11/20/20 0954  TempSrc: Oral  PainSc: 0-No pain         Complications: No notable events documented.

## 2020-11-20 NOTE — Anesthesia Preprocedure Evaluation (Signed)
Anesthesia Evaluation  Patient identified by MRN, date of birth, ID band Patient awake  General Assessment Comment:Ehlers-Danlos disease  Reviewed: Allergy & Precautions, NPO status , Patient's Chart, lab work & pertinent test results  Airway Mallampati: II  TM Distance: >3 FB Neck ROM: Full    Dental no notable dental hx.    Pulmonary neg pulmonary ROS,    Pulmonary exam normal breath sounds clear to auscultation       Cardiovascular hypertension, Pt. on medications Normal cardiovascular exam Rhythm:Regular Rate:Normal     Neuro/Psych negative neurological ROS  negative psych ROS   GI/Hepatic negative GI ROS, Neg liver ROS,   Endo/Other  negative endocrine ROS  Renal/GU negative Renal ROS  negative genitourinary   Musculoskeletal negative musculoskeletal ROS (+)   Abdominal   Peds negative pediatric ROS (+)  Hematology negative hematology ROS (+)   Anesthesia Other Findings   Reproductive/Obstetrics negative OB ROS                             Anesthesia Physical Anesthesia Plan  ASA: 2  Anesthesia Plan: MAC   Post-op Pain Management:    Induction: Intravenous  PONV Risk Score and Plan: 2 and Propofol infusion and Treatment may vary due to age or medical condition  Airway Management Planned: Simple Face Mask  Additional Equipment:   Intra-op Plan:   Post-operative Plan:   Informed Consent: I have reviewed the patients History and Physical, chart, labs and discussed the procedure including the risks, benefits and alternatives for the proposed anesthesia with the patient or authorized representative who has indicated his/her understanding and acceptance.     Dental advisory given  Plan Discussed with: CRNA and Surgeon  Anesthesia Plan Comments:         Anesthesia Quick Evaluation

## 2020-11-20 NOTE — H&P (Signed)
Roberta [name] is an 30 y.o. female.   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  This procedure is being done to confirm the absence of gastric intestinal metaplasia.  Past Medical History:  Diagnosis Date   Anxiety    Depression    Dumping syndrome    Ehlers-Danlos syndrome    Postural orthostatic tachycardia syndrome     Past Surgical History:  Procedure Laterality Date   INNER EAR SURGERY     UPPER GI ENDOSCOPY      Family History  Problem Relation Age of Onset   Lymphoma Paternal Grandfather    Social History:  reports that she has never smoked. She has never used smokeless tobacco. No history on file for alcohol use and drug use.  Allergies:  Allergies  Allergen Reactions   Shellfish Allergy Anaphylaxis, Hives and Other (See Comments)   Shellfish-Derived Products     Other reaction(s): hives, Unknown   Penicillin G Rash    Other reaction(s): rash    Medications Prior to Admission  Medication Sig Dispense Refill   cloNIDine (CATAPRES) 0.1 MG tablet Take 0.1 mg by mouth 2 (two) times daily.     desvenlafaxine (PRISTIQ) 50 MG 24 hr tablet Take 50 mg by mouth daily.     EPINEPHrine 0.3 mg/0.3 mL IJ SOAJ injection Inject 0.3 mg into the muscle as needed for anaphylaxis.     ivabradine (CORLANOR) 5 MG TABS tablet Take 5 mg by mouth 2 (two) times daily with a meal.     metoprolol succinate (TOPROL-XL) 50 MG 24 hr tablet Take 50 mg by mouth daily. Take with or immediately following a meal.     octreotide (SANDOSTATIN) 50 MCG/ML  SOLN injection Inject 50 mcg into the skin in the morning, at noon, and at bedtime.     ondansetron (ZOFRAN-ODT) 8 MG disintegrating tablet Take 8 mg by mouth every 8 (eight) hours as needed for nausea or vomiting.     sertraline (ZOLOFT) 50 MG tablet Take 50 mg by mouth daily.     busPIRone (BUSPAR) 10 MG tablet Take 1 tablet (10 mg total) by mouth 2 (two) times daily. (Patient not taking: Reported on 11/12/2020) 60 tablet 0   escitalopram (LEXAPRO) 20 MG tablet Take 1 tablet (20 mg total) by mouth daily. (Patient not taking: No sig reported) 90 tablet 1   hydrOXYzine (VISTARIL) 25 MG capsule Take 1 capsule (25 mg total) by mouth 3 (three) times daily as needed for anxiety. (Patient not taking: Reported on 11/12/2020) 30 capsule 0    No results found for this or any previous visit (from the past 48 hour(s)). No results found.  Review of Systems  Blood pressure (!) 148/96, temperature 98.7 F (37.1 C), temperature source Oral, resp. rate 17, height 5\' 6"  (1.676 m), weight 81.6 kg, last menstrual period 10/27/2020, SpO2 100 %. Physical Exam pleasant, healthy in appearance.  Heart normal, chest clear, abdomen nontender  Assessment/Plan Proceed to endoscopic evaluation with the intent of biopsies to recheck for gastric intestinal metaplasia.  If biopsies this time are negative, it is felt that subsequent examinations could be spaced out  to longer intervals.  Florencia Reasons, MD 11/20/2020, 10:44 AM

## 2020-11-20 NOTE — Anesthesia Postprocedure Evaluation (Signed)
Anesthesia Post Note  Patient: Roberta [name]  Procedure(s) Performed: ESOPHAGOGASTRODUODENOSCOPY (EGD) BIOPSY     Patient location during evaluation: PACU Anesthesia Type: MAC Level of consciousness: awake and alert Pain management: pain level controlled Vital Signs Assessment: post-procedure vital signs reviewed and stable Respiratory status: spontaneous breathing, nonlabored ventilation, respiratory function stable and patient connected to nasal cannula oxygen Cardiovascular status: stable and blood pressure returned to baseline Postop Assessment: no apparent nausea or vomiting Anesthetic complications: no   No notable events documented.  Last Vitals:  Vitals:   11/20/20 1117 11/20/20 1120  BP: 126/70 134/63  Pulse: 82 77  Resp: 20 (!) 21  Temp: 37 C   SpO2: 100% 100%    Last Pain:  Vitals:   11/20/20 1120  TempSrc:   PainSc: 0-No pain                 [name]

## 2020-11-20 NOTE — Op Note (Signed)
[facility] Patient Name: Roberta [name] Procedure Date: 11/20/2020 MRN: [id number] Attending MD: Bernette Redbird , MD Date of Birth: 02-13-1991 CSN: [id number] Age: 30 Admit Type: Outpatient Procedure:                Upper GI endoscopy Indications:              Follow-up of gastric intestinal metaplasia--gastric                            nodule with intestinal metaplasia on initial                            biopsies 09/2019, but not present on follow-up                            biopsies 01/2020 (at which time gastric mapping was                            also negative for intestinal metaplasia or H.                            pylori). Providers:                Bernette Redbird, MD, Audley Hose, [place],                            Anastasio Champion, CRNA Referring MD:              Medicines:                Propofol per Anesthesia Complications:            No immediate complications. Estimated Blood Loss:     Estimated blood loss was minimal. Procedure:                Pre-Anesthesia Assessment:                           - Prior to the procedure, a History and Physical                            was performed, and patient medications and                            allergies were reviewed. The patient's tolerance of                            previous anesthesia was also reviewed. The risks                            and benefits of the procedure and the sedation                            options and risks were discussed with the patient.                            All questions were  answered, and informed consent                            was obtained. Prior Anticoagulants: The patient has                            taken no previous anticoagulant or antiplatelet                            agents. ASA Grade Assessment: II - A patient with                            mild systemic disease. After reviewing the risks                            and benefits, the patient was  deemed in                            satisfactory condition to undergo the procedure.                           After obtaining informed consent, the endoscope was                            passed under direct vision. Throughout the                            procedure, the patient's blood pressure, pulse, and                            oxygen saturations were monitored continuously. The                            [id number]-[id number] ([id number] Olympus endoscope was introduced                            through the mouth, and advanced to the second part                            of duodenum. The upper GI endoscopy was                            accomplished without difficulty. The patient                            tolerated the procedure well. Scope In: Scope Out: Findings:      The examined esophagus was normal.      A single 12 mm mucosal papule (nodule) was found at the gastroesophageal       junction, as previously noted. Biopsies were taken with a cold forceps       for histology.      The exam of the stomach was otherwise normal.      The cardia and gastric fundus were normal on retroflexion.      The examined duodenum  was normal.      Biopsies were taken with a cold forceps in the gastric fundus, at the       incisura and in the gastric antrum for histology per the [place] protocol. Impression:               - Normal esophagus.                           - A single mucosal papule (nodule) found in the                            stomach. Biopsied.                           - Normal examined duodenum.                           - Biopsies were taken with a cold forceps for                            histology in the gastric fundus, at the incisura                            and in the gastric antrum ([place] protocol). Moderate Sedation:      This patient was sedated with monitored anesthesia care, not moderate       sedation. Recommendation:           - Continue present medications.                            - Await pathology results. Procedure Code(s):        --- Professional ---                           [phone], Esophagogastroduodenoscopy, flexible,                            transoral; with biopsy, single or multiple Diagnosis Code(s):        --- Professional ---                           K31.89, Other diseases of stomach and duodenum CPT copyright 2019 [employer] Association. All rights reserved. The codes documented in this report are preliminary and upon coder review may  be revised to meet current compliance requirements. Bernette Redbird, MD 11/20/2020 11:43:37 AM This report has been signed electronically. Number of Addenda: 0

## 2020-11-21 LAB — SURGICAL PATHOLOGY

## 2020-11-25 ENCOUNTER — Encounter (HOSPITAL_COMMUNITY): Payer: Self-pay | Admitting: Gastroenterology

## 2020-11-27 ENCOUNTER — Ambulatory Visit: Payer: Managed Care, Other (non HMO) | Admitting: Physical Therapy

## 2020-12-04 ENCOUNTER — Other Ambulatory Visit: Payer: Self-pay

## 2020-12-04 ENCOUNTER — Encounter: Payer: Self-pay | Admitting: Physical Therapy

## 2020-12-04 ENCOUNTER — Ambulatory Visit: Payer: Managed Care, Other (non HMO) | Attending: Cardiology | Admitting: Physical Therapy

## 2020-12-04 DIAGNOSIS — R42 Dizziness and giddiness: Secondary | ICD-10-CM | POA: Diagnosis present

## 2020-12-04 DIAGNOSIS — M357 Hypermobility syndrome: Secondary | ICD-10-CM | POA: Diagnosis present

## 2020-12-04 DIAGNOSIS — M6281 Muscle weakness (generalized): Secondary | ICD-10-CM | POA: Insufficient documentation

## 2020-12-04 DIAGNOSIS — I498 Other specified cardiac arrhythmias: Secondary | ICD-10-CM | POA: Insufficient documentation

## 2020-12-04 DIAGNOSIS — G90A Postural orthostatic tachycardia syndrome (POTS): Secondary | ICD-10-CM

## 2020-12-04 NOTE — Therapy (Signed)
[facility] [address] [place], [place], [address] Phone: [phone]   Fax:  [phone]  Physical Therapy Treatment/Re-Evaluation  Patient Details  Name: Roberta [name] MRN: [id number] Date of Birth: [birth year] Referring Provider (PT): Dr. Christie Beckers , Dr Merri Brunette   Encounter Date: 12/04/2020   PT End of Session - 12/04/20 1104     Visit Number 15    Number of Visits 23    Date for PT Re-Evaluation 01/29/21    Authorization Type [employer]    PT Start Time 1100    PT Stop Time 1145    PT Time Calculation (min) 45 min    Activity Tolerance Patient limited by fatigue;Patient tolerated treatment well    Behavior During Therapy [facility] for tasks assessed/performed             Past Medical History:  Diagnosis Date   Anxiety    Depression    Dumping syndrome    Ehlers-Danlos syndrome    Postural orthostatic tachycardia syndrome     Past Surgical History:  Procedure Laterality Date   BIOPSY  11/20/2020   Procedure: BIOPSY;  Surgeon: Bernette Redbird, MD;  Location: [facility] ENDOSCOPY;  Service: Endoscopy;;   ESOPHAGOGASTRODUODENOSCOPY N/A 11/20/2020   Procedure: ESOPHAGOGASTRODUODENOSCOPY (EGD);  Surgeon: Bernette Redbird, MD;  Location: [facility];  Service: Endoscopy;  Laterality: N/A;   INNER EAR SURGERY     UPPER GI ENDOSCOPY      There were no vitals filed for this visit.   Subjective Assessment - 12/04/20 1101     Subjective Gets the results of her biopsy next week.  Was out of work 1 week. I lost blood during the procedure and it made me so weak. No pain. 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  I wobble all the time and my Rt foot drags.    Pertinent History POTS, GI issues, EDS type III    Limitations Lifting;Standing;Walking;House hold activities    Patient Stated Goals Patientr would like to move and not be so out of breath    Currently in Pain? No/denies              [facility]  PT Treatment/Exercise:  Therapeutic Exercise: - Recumbant bike 6 min L2 HR 85 normal Sa O2 -Supine core  Bridging 3 sets: 1 x 10 with ball, 1 x 10 with 10 lbs , 1 x 10 long  hold with iso squeeze  5 lbs x 2 skull crushers   Supine chest press 5 lbs with core challenge using ball  under f eet Sidelying hip abduction  red band x 10 then "hot potato" x 10   Single leg balance     PT Short Term Goals - 11/19/20 7412       PT SHORT TERM GOAL #1   Title Pt will be consistent and I with HEP for core, large muscle group strengthening    Status Achieved      PT SHORT TERM GOAL #2   Title Pt will use RPE and HR monitor (Fitbit) to monitor self while doing cardio    Status Achieved      PT SHORT TERM GOAL #3   Title Pt will be able to report feeling less fatigue post work, able to do simple home tasks and not have to go right to bed.    Status On-going  PT Long Term Goals - 12/04/20 1105       PT LONG TERM GOAL #1   Title Pt will be able to show Independence with HEP and consistent cardio routine    Baseline limited by fatigue and sx post procedure    Time 8    Period Weeks    Status On-going      PT LONG TERM GOAL #2   Title Patient will be able to squat, lift up to 25 lbs x 10 with no pain, just min fatigue    Status Achieved    Target Date 01/29/21      PT LONG TERM GOAL #3   Title Pt will be able to see a NICU patient for 30 min in standing with min difficulty    Baseline can do 15 min and then has to sit    Time 8    Period Weeks    Status On-going    Target Date 01/29/21      PT LONG TERM GOAL #4   Title Pt will be able to show 5/5 strength in core and LEs    Baseline hip flex 4/5, strong in knees but poor endurance and "wobbly"    Time 8    Period Weeks    Status On-going    Target Date 01/29/21      PT LONG TERM GOAL #5   Title Patient will be able to go shopping independently for groceries, [employer] for up to 30 min with moderate fatigue  on the weekend.    Time 8    Period Weeks    Status New    Target Date 01/29/21                   Plan - 12/04/20 1119     Clinical Impression Statement Patient with complicated past 2-3 weeks due to medical issues related to GI and dumping syndrome.  She continues to lack endurance and stability to functional mobility including work.  When she is not working she is very sedentary, unable to stand or walk sometimes even over 1000 step 2 days per week.  She is unable to go on family outings , shopping without excessive fatigue.    Personal Factors and Comorbidities Comorbidity 3+;Other    Comorbidities chronic conditon, EDS hypermobility , POTS, GI    Examination-Activity Limitations Lift;Stairs;Bend;Locomotion Level;Stand;Caring for Others;Reach Overhead;Carry    Examination-Participation Restrictions Cleaning;Shop;Community Activity;Occupation;Interpersonal Relationship    Stability/Clinical Decision Making Stable/Uncomplicated    Rehab Potential Good    PT Frequency 1x / week    PT Duration 8 weeks    PT Treatment/Interventions ADLs/Self Care Home Management;Moist Heat;Neuromuscular re-education;Balance training;Therapeutic exercise;Therapeutic activities;Manual techniques;Taping    PT Next Visit Plan strengthening/standing as tolerated , CORE, endurance .   Monitor RPE and HR.  did she trial doing more sitting at home and doing her HEP at home.    PT Home Exercise Plan Access Code: [email or web address]  URL: [email or web address]  Date: 08/28/2020  Prepared by: Karie Mainland    Exercises  Supine 90/90 with Leg Extensions - 1 x daily - 7 x weekly - 2 sets - 10 reps - 5 hold  Supine Bridge - 1 x daily - 7 x weekly - 2 sets - 10 reps - 5 hold  Active Straight Leg Raise with Quad Set - 1 x daily - 7 x weekly - 2 sets - 10 reps - 5 hold  Prone Hip Extension - 1 x daily - 7  x weekly - 2 sets - 10 reps - 5 hold  Sidelying Hip Abduction - 1 x daily - 7 x weekly - 2 sets - 10 reps - 5 hold   Sidelying Hip Adduction - 1 x daily - 7 x weekly - 2 sets - 10 reps - 5 hold  Supine Shoulder Horizontal Abduction with Resistance - 1 x daily - 7 x weekly - 2 sets - 10 reps  Seated Shoulder Front Raise with Resistance - 1 x daily - 7 x weekly - 2 sets - 10 reps    Consulted and Agree with Plan of Care Patient             Patient will benefit from skilled therapeutic intervention in order to improve the following deficits and impairments:  Decreased balance, Decreased endurance, Decreased mobility, Difficulty walking, Postural dysfunction, Pain, Decreased strength, Decreased activity tolerance, Cardiopulmonary status limiting activity, Dizziness  Visit Diagnosis: Dizziness and giddiness  Muscle weakness (generalized)  Hypermobility syndrome  POTS (postural orthostatic tachycardia syndrome)     Problem List Patient Active Problem List   Diagnosis Date Noted   Generalized anxiety disorder 07/02/2020   Dry heaves 03/28/2020   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, involving the cardia 03/28/2020   Nausea and vomiting 03/28/2020   Ehlers-Danlos disease 11/22/2019   Chronic eczematous otitis externa of both ears 12/07/2018   Excessive cerumen in both ear canals 12/07/2018   POTS (postural orthostatic tachycardia syndrome) 11/04/2014   Benign essential hypertension 10/10/2014   Lymphadenopathy 02/10/2011    Stephanos Fan, PT 12/04/2020, 12:02 PM  [facility] [address] [place], [place], [address] Phone: [phone]   Fax:  [phone]  Name: Roberta [name] MRN: [id number] Date of Birth: 1990-05-31   Karie Mainland, PT 12/04/20 12:02 PM Phone: [phone] Fax: [phone]

## 2020-12-11 ENCOUNTER — Encounter: Payer: Self-pay | Admitting: Physical Therapy

## 2020-12-11 ENCOUNTER — Ambulatory Visit: Payer: Managed Care, Other (non HMO) | Admitting: Physical Therapy

## 2020-12-11 ENCOUNTER — Other Ambulatory Visit: Payer: Self-pay

## 2020-12-11 VITALS — BP 133/83

## 2020-12-11 DIAGNOSIS — M6281 Muscle weakness (generalized): Secondary | ICD-10-CM

## 2020-12-11 DIAGNOSIS — M357 Hypermobility syndrome: Secondary | ICD-10-CM

## 2020-12-11 DIAGNOSIS — G90A Postural orthostatic tachycardia syndrome (POTS): Secondary | ICD-10-CM

## 2020-12-11 DIAGNOSIS — I498 Other specified cardiac arrhythmias: Secondary | ICD-10-CM

## 2020-12-11 DIAGNOSIS — R42 Dizziness and giddiness: Secondary | ICD-10-CM

## 2020-12-11 NOTE — Therapy (Signed)
[facility] [address] [place], [place], [address] Phone: [phone]   Fax:  [phone]  Physical Therapy Treatment  Patient Details  Name: Roberta [name] MRN: [id number] Date of Birth: [birth year] Referring Provider (PT): Dr. Christie Beckers , Dr Merri Brunette   Encounter Date: 12/11/2020   PT End of Session - 12/11/20 1242     Visit Number 16    Date for PT Re-Evaluation 01/29/21    Authorization Type [employer]    PT Start Time 1231    PT Stop Time 1315    PT Time Calculation (min) 44 min    Activity Tolerance Patient tolerated treatment well    Behavior During Therapy [facility] for tasks assessed/performed             Past Medical History:  Diagnosis Date   Anxiety    Depression    Dumping syndrome    Ehlers-Danlos syndrome    Postural orthostatic tachycardia syndrome     Past Surgical History:  Procedure Laterality Date   BIOPSY  11/20/2020   Procedure: BIOPSY;  Surgeon: Bernette Redbird, MD;  Location: [facility];  Service: Endoscopy;;   ESOPHAGOGASTRODUODENOSCOPY N/A 11/20/2020   Procedure: ESOPHAGOGASTRODUODENOSCOPY (EGD);  Surgeon: Bernette Redbird, MD;  Location: [facility];  Service: Endoscopy;  Laterality: N/A;   INNER EAR SURGERY     UPPER GI ENDOSCOPY      Vitals:   12/11/20 1235  BP: 133/83     Subjective Assessment - 12/11/20 1235     Subjective They moved my appt back by a week . Knee pain today, rated 4/10.  Walking outside with the dog, maybe that is it. My chest cartilage is popping when I turn.    Currently in Pain? Yes    Pain Score 4     Pain Location Knee    Pain Orientation Right;Left    Pain Descriptors / Indicators Aching    Pain Type Acute pain    Pain Onset More than a month ago    Pain Frequency Intermittent    Aggravating Factors  walking    Pain Relieving Factors rest               Skilled therapy interventions:   Therapeutic Exercise:  3 rounds of each   10 lb KB  squat   30 sec x 3   Modified mat table burpees (about 6 in 1 min)   1 min on 2 min off   VSS 103 and o2 sat 97-98%   Single leg hinge with pass 5 lbs DB    X 10 each arm each leg    Single leg stance with 5 lbs pass x 10   Prone hip extension x 10 alternating, cues for avoiding spine rotation  Shoulder flexion x 10 alt.   Combo x 10 each     PT Short Term Goals - 11/19/20 9470       PT SHORT TERM GOAL #1   Title Pt will be consistent and I with HEP for core, large muscle group strengthening    Status Achieved      PT SHORT TERM GOAL #2   Title Pt will use RPE and HR monitor (Fitbit) to monitor self while doing cardio    Status Achieved      PT SHORT TERM GOAL #3   Title Pt will be able to report feeling less fatigue post work, able to do simple home tasks and not have to go right  to bed.    Status On-going               PT Long Term Goals - 12/04/20 1105       PT LONG TERM GOAL #1   Title Pt will be able to show Independence with HEP and consistent cardio routine    Baseline limited by fatigue and sx post procedure    Time 8    Period Weeks    Status On-going      PT LONG TERM GOAL #2   Title Patient will be able to squat, lift up to 25 lbs x 10 with no pain, just min fatigue    Status Achieved    Target Date 01/29/21      PT LONG TERM GOAL #3   Title Pt will be able to see a NICU patient for 30 min in standing with min difficulty    Baseline can do 15 min and then has to sit    Time 8    Period Weeks    Status On-going    Target Date 01/29/21      PT LONG TERM GOAL #4   Title Pt will be able to show 5/5 strength in core and LEs    Baseline hip flex 4/5, strong in knees but poor endurance and "wobbly"    Time 8    Period Weeks    Status On-going    Target Date 01/29/21      PT LONG TERM GOAL #5   Title Patient will be able to go shopping independently for groceries, [employer] for up to 30 min with moderate fatigue on the weekend.    Time 8    Period  Weeks    Status New    Target Date 01/29/21                   Plan - 12/11/20 1254     Clinical Impression Statement Patient has still not heard about her biopsy. Worked on elevation changes today.  She did need to lie down x 1 between sets of modified burpees due to dizziness and nausea. She continues to need cues (tactile and verbal)  for stabilization, plank and prone.    PT Treatment/Interventions ADLs/Self Care Home Management;Moist Heat;Neuromuscular re-education;Balance training;Therapeutic exercise;Therapeutic activities;Manual techniques;Taping    PT Next Visit Plan strengthening/standing as tolerated , CORE, endurance .   Monitor RPE and HR.  did she trial doing more standing at home and doing her HEP at home    PT Home Exercise Plan Access Code: [email or web address]  URL: [email or web address]  Date: 08/28/2020  Prepared by: Karie Mainland    Exercises  Supine 90/90 with Leg Extensions - 1 x daily - 7 x weekly - 2 sets - 10 reps - 5 hold  Supine Bridge - 1 x daily - 7 x weekly - 2 sets - 10 reps - 5 hold  Active Straight Leg Raise with Quad Set - 1 x daily - 7 x weekly - 2 sets - 10 reps - 5 hold  Prone Hip Extension - 1 x daily - 7 x weekly - 2 sets - 10 reps - 5 hold  Sidelying Hip Abduction - 1 x daily - 7 x weekly - 2 sets - 10 reps - 5 hold  Sidelying Hip Adduction - 1 x daily - 7 x weekly - 2 sets - 10 reps - 5 hold  Supine Shoulder Horizontal Abduction with Resistance - 1 x daily - 7  x weekly - 2 sets - 10 reps  Seated Shoulder Front Raise with Resistance - 1 x daily - 7 x weekly - 2 sets - 10 reps    Consulted and Agree with Plan of Care Patient             Patient will benefit from skilled therapeutic intervention in order to improve the following deficits and impairments:  Decreased balance, Decreased endurance, Decreased mobility, Difficulty walking, Postural dysfunction, Pain, Decreased strength, Decreased activity tolerance, Cardiopulmonary status limiting  activity, Dizziness  Visit Diagnosis: Dizziness and giddiness  Muscle weakness (generalized)  Hypermobility syndrome  POTS (postural orthostatic tachycardia syndrome)     Problem List Patient Active Problem List   Diagnosis Date Noted   Generalized anxiety disorder 07/02/2020   Dry heaves 03/28/2020   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 [place], [place], [address] Phone: [phone]   Fax:  [phone]  Name: Roberta [name] MRN: [id number] Date of Birth: 11-Jan-1991  Karie Mainland, PT 12/11/20 1:22 PM Phone: [phone] Fax: [phone]

## 2020-12-12 ENCOUNTER — Ambulatory Visit (INDEPENDENT_AMBULATORY_CARE_PROVIDER_SITE_OTHER): Payer: 59 | Admitting: Adult Health

## 2020-12-12 ENCOUNTER — Encounter: Payer: Self-pay | Admitting: Adult Health

## 2020-12-12 DIAGNOSIS — F411 Generalized anxiety disorder: Secondary | ICD-10-CM

## 2020-12-12 DIAGNOSIS — F331 Major depressive disorder, recurrent, moderate: Secondary | ICD-10-CM | POA: Diagnosis not present

## 2020-12-12 MED ORDER — BUSPIRONE HCL 15 MG PO TABS: 15.0000 mg | ORAL_TABLET | Freq: Two times a day (BID) | ORAL | 1 refills | Status: DC

## 2020-12-12 MED ORDER — DESVENLAFAXINE SUCCINATE ER 100 MG PO TB24: 100.0000 mg | ORAL_TABLET | Freq: Every day | ORAL | 1 refills | Status: DC

## 2020-12-12 MED ORDER — SERTRALINE HCL 100 MG PO TABS: 100.0000 mg | ORAL_TABLET | Freq: Every day | ORAL | 1 refills | Status: DC

## 2020-12-12 NOTE — Progress Notes (Signed)
Roberta [name] [id number] [birth year] 30 y.o.  Subjective:   Patient ID:  Roberta [name] is a 30 y.o. (DOB 1990/04/15) female.  Chief Complaint: No chief complaint on file.   HPI Roberta [name] presents to the office today for follow-up of MDD and GAD.  Accompanied by friend - communicating for patient. Patient more verbal today - answering most questions.  Describes mood today as "a little better". Pleasant. Tearful at times. Mood symptoms - reports depression and irritability. Denies anxiety. Feels like the addition of Pristiq has been helpful - willing to increase dose to 100mg .  Continues to work 4 days a week. Working with PCP for medical issues. Varying interest and motivation. Taking medications as prescribed.  Energy levels lower. Active, does not have a regular exercise routine with POTS.  Enjoys some usual interests and activities. Single. Lives alone with dog. Family local. Spending time with family. Appetite adequate. Weight stable - 187 pounds. Sleeps well most nights. Averages 9 to 10 hours. Napping during the day. Focus and concentration stable. Completing tasks. Managing aspects of household. Works full-time - 4 10 hour days. Works as a at Human resources officer. Denies SI or HI.  Denies AH or VH.   Previous medication trials: Wellbutrin - irritability, Pristiq, Buspar, Vistaril   Flowsheet Row Admission (Discharged) from 11/20/2020 in [facility] ENDOSCOPY  C-SSRS RISK CATEGORY No Risk        Review of Systems:  Review of Systems  Musculoskeletal:  Negative for gait problem.  Neurological:  Negative for tremors.  Psychiatric/Behavioral:         Please refer to HPI   Medications: I have reviewed the patient's current medications.  Current Outpatient Medications  Medication Sig Dispense Refill   busPIRone (BUSPAR) 15 MG tablet Take 1 tablet (15 mg total) by mouth 2 (two) times daily. 180 tablet 1   cloNIDine (CATAPRES) 0.1 MG  tablet Take 0.1 mg by mouth 2 (two) times daily.     desvenlafaxine (PRISTIQ) 100 MG 24 hr tablet Take 1 tablet (100 mg total) by mouth daily. 90 tablet 1   EPINEPHrine 0.3 mg/0.3 mL IJ SOAJ injection Inject 0.3 mg into the muscle as needed for anaphylaxis.     hydrOXYzine (VISTARIL) 25 MG capsule Take 1 capsule (25 mg total) by mouth 3 (three) times daily as needed for anxiety. (Patient not taking: Reported on 11/12/2020) 30 capsule 0   ivabradine (CORLANOR) 5 MG TABS tablet Take 5 mg by mouth 2 (two) times daily with a meal.     metoprolol succinate (TOPROL-XL) 50 MG 24 hr tablet Take 50 mg by mouth daily. Take with or immediately following a meal.     octreotide (SANDOSTATIN) 50 MCG/ML SOLN injection Inject 50 mcg into the skin in the morning, at noon, and at bedtime.     ondansetron (ZOFRAN-ODT) 8 MG disintegrating tablet Take 8 mg by mouth every 8 (eight) hours as needed for nausea or vomiting.     sertraline (ZOLOFT) 100 MG tablet Take 1 tablet (100 mg total) by mouth daily. 90 tablet 1   No current facility-administered medications for this visit.    Medication Side Effects: None  Allergies:  Allergies  Allergen Reactions   Shellfish Allergy Anaphylaxis, Hives and Other (See Comments)   Shellfish-Derived Products     Other reaction(s): hives, Unknown   Penicillin G Rash    Other reaction(s): rash    Past Medical History:  Diagnosis Date   Anxiety  Depression    Dumping syndrome    Ehlers-Danlos syndrome    Postural orthostatic tachycardia syndrome     Past Medical History, Surgical history, Social history, and Family history were reviewed and updated as appropriate.   Please see review of systems for further details on the patient's review from today.   Objective:   Physical Exam:  There were no vitals taken for this visit.  Physical Exam Constitutional:      General: She is not in acute distress. Musculoskeletal:        General: No deformity.  Neurological:      Mental Status: She is alert and oriented to person, place, and time.     Coordination: Coordination normal.  Psychiatric:        Attention and Perception: Attention and perception normal. She does not perceive auditory or visual hallucinations.        Mood and Affect: Mood normal. Mood is not anxious or depressed. Affect is not labile, blunt, angry or inappropriate.        Speech: Speech normal.        Behavior: Behavior normal.        Thought Content: Thought content normal. Thought content is not paranoid or delusional. Thought content does not include homicidal or suicidal ideation. Thought content does not include homicidal or suicidal plan.        Cognition and Memory: Cognition and memory normal.        Judgment: Judgment normal.     Comments: Insight intact    Lab Review:     Component Value Date/Time   NA 139 03/24/2011 0910   K 3.7 03/24/2011 0910   CL 104 03/24/2011 0910   CO2 24 03/24/2011 0910   GLUCOSE 83 03/24/2011 0910   BUN 10 03/24/2011 0910   CREATININE 0.69 03/24/2011 0910   CALCIUM 9.5 03/24/2011 0910   PROT 7.4 03/24/2011 0910   ALBUMIN 5.1 03/24/2011 0910   AST 25 03/24/2011 0910   ALT 20 03/24/2011 0910   ALKPHOS 38 (L) 03/24/2011 0910   BILITOT 1.0 03/24/2011 0910       Component Value Date/Time   WBC 6.6 03/24/2011 0910   RBC 5.16 03/24/2011 0910   HGB 15.6 03/24/2011 0910   HCT 43.0 03/24/2011 0910   PLT 254 03/24/2011 0910   MCV 83.3 03/24/2011 0910   MCH 30.2 03/24/2011 0910   MCHC 36.3 (H) 03/24/2011 0910   RDW 12.6 03/24/2011 0910   LYMPHSABS 2.7 03/24/2011 0910   MONOABS 0.4 03/24/2011 0910   EOSABS 0.2 03/24/2011 0910   BASOSABS 0.0 03/24/2011 0910    No results found for: POCLITH, LITHIUM   No results found for: PHENYTOIN, PHENOBARB, VALPROATE, CBMZ   .res Assessment: Plan:    Plan:  PDMP reviewed  Zoloft 100mg  daily Pristiq 100mg  daily Buspar 10 to 15mg  BID   Time spent with patient was 15 minutes. Greater than 50%  of face to face time with patient was spent on counseling and coordination of care.   RTC 4/6 weeks  Patient advised to contact office with any questions, adverse effects, or acute worsening in signs and symptoms.  Diagnoses and all orders for this visit:  Major depressive disorder, recurrent episode, moderate (HCC) -     desvenlafaxine (PRISTIQ) 100 MG 24 hr tablet; Take 1 tablet (100 mg total) by mouth daily. -     sertraline (ZOLOFT) 100 MG tablet; Take 1 tablet (100 mg total) by mouth daily. -  busPIRone (BUSPAR) 15 MG tablet; Take 1 tablet (15 mg total) by mouth 2 (two) times daily.  Generalized anxiety disorder -     desvenlafaxine (PRISTIQ) 100 MG 24 hr tablet; Take 1 tablet (100 mg total) by mouth daily. -     sertraline (ZOLOFT) 100 MG tablet; Take 1 tablet (100 mg total) by mouth daily. -     busPIRone (BUSPAR) 15 MG tablet; Take 1 tablet (15 mg total) by mouth 2 (two) times daily.    Please see After Visit Summary for patient specific instructions.  Future Appointments  Date Time Provider Department Center  12/18/2020 11:00 AM Lum Keas [facility]  12/25/2020 11:00 AM Lum Keas [facility]  01/09/2021  8:40 AM Basilia Stuckert, Thereasa Solo, NP CP-CP None    No orders of the defined types were placed in this encounter.   -------------------------------

## 2020-12-18 ENCOUNTER — Ambulatory Visit: Payer: Managed Care, Other (non HMO) | Admitting: Physical Therapy

## 2020-12-18 ENCOUNTER — Other Ambulatory Visit: Payer: Self-pay

## 2020-12-18 ENCOUNTER — Encounter: Payer: Self-pay | Admitting: Physical Therapy

## 2020-12-18 DIAGNOSIS — M357 Hypermobility syndrome: Secondary | ICD-10-CM

## 2020-12-18 DIAGNOSIS — R42 Dizziness and giddiness: Secondary | ICD-10-CM

## 2020-12-18 DIAGNOSIS — G90A Postural orthostatic tachycardia syndrome (POTS): Secondary | ICD-10-CM

## 2020-12-18 DIAGNOSIS — I498 Other specified cardiac arrhythmias: Secondary | ICD-10-CM

## 2020-12-18 DIAGNOSIS — M6281 Muscle weakness (generalized): Secondary | ICD-10-CM

## 2020-12-18 NOTE — Therapy (Signed)
[facility] [address] [place], [place], [address] Phone: [phone]   Fax:  [phone]  Physical Therapy Treatment  Patient Details  Name: Roberta [name] MRN: [id number] Date of Birth: 01-04-1991 Referring Provider (PT): Dr. Christie Beckers , Dr Merri Brunette   Encounter Date: 12/18/2020   PT End of Session - 12/18/20 1119     Visit Number 17    Number of Visits 23    Date for PT Re-Evaluation 01/29/21    Authorization Type [employer]    PT Start Time 1103    PT Stop Time 1146    PT Time Calculation (min) 43 min    Activity Tolerance Patient tolerated treatment well    Behavior During Therapy [facility] for tasks assessed/performed             Past Medical History:  Diagnosis Date   Anxiety    Depression    Dumping syndrome    Ehlers-Danlos syndrome    Postural orthostatic tachycardia syndrome     Past Surgical History:  Procedure Laterality Date   BIOPSY  11/20/2020   Procedure: BIOPSY;  Surgeon: Bernette Redbird, MD;  Location: [facility];  Service: Endoscopy;;   ESOPHAGOGASTRODUODENOSCOPY N/A 11/20/2020   Procedure: ESOPHAGOGASTRODUODENOSCOPY (EGD);  Surgeon: Bernette Redbird, MD;  Location: [facility];  Service: Endoscopy;  Laterality: N/A;   INNER EAR SURGERY     UPPER GI ENDOSCOPY      There were no vitals filed for this visit.   Subjective Assessment - 12/18/20 1104     Subjective No pain , complaints today other than nauseous.  I have a new problem.   Pt reports urinary stress incontinence.  When lifting or being more active.    Currently in Pain? No/denies             Skilled therapy interventions:   Therapeutic Exercise:   Recumbent bike L2  for 5 min   Ball squeeze with neutral spine pelvic floor contraction Ball bridge x 10 Ball under pelvis unilateral alt. Clam  Single arm 3 lbs pass in horizontal abd x 10 each side  5 lbs chest height with alt. Knee march on ball x 10  Heel slide to SLR x 8  each LE with ball  Dead bug x 10 each side   Pilates spring board:   Standing row, extension with roll down bar on springboard x10 Slastix facing outward, alternating anti-rotation in horizontal  Abduction   Squat with chest press x 15    Self care/Pt. Education: Pelvic floor discussion leading into basic review of Tr A activation and inner unit       PT Short Term Goals - 11/19/20 6712       PT SHORT TERM GOAL #1   Title Pt will be consistent and I with HEP for core, large muscle group strengthening    Status Achieved      PT SHORT TERM GOAL #2   Title Pt will use RPE and HR monitor (Fitbit) to monitor self while doing cardio    Status Achieved      PT SHORT TERM GOAL #3   Title Pt will be able to report feeling less fatigue post work, able to do simple home tasks and not have to go right to bed.    Status On-going               PT Long Term Goals - 12/04/20 1105       PT LONG  TERM GOAL #1   Title Pt will be able to show Independence with HEP and consistent cardio routine    Baseline limited by fatigue and sx post procedure    Time 8    Period Weeks    Status On-going      PT LONG TERM GOAL #2   Title Patient will be able to squat, lift up to 25 lbs x 10 with no pain, just min fatigue    Status Achieved    Target Date 01/29/21      PT LONG TERM GOAL #3   Title Pt will be able to see a NICU patient for 30 min in standing with min difficulty    Baseline can do 15 min and then has to sit    Time 8    Period Weeks    Status On-going    Target Date 01/29/21      PT LONG TERM GOAL #4   Title Pt will be able to show 5/5 strength in core and LEs    Baseline hip flex 4/5, strong in knees but poor endurance and "wobbly"    Time 8    Period Weeks    Status On-going    Target Date 01/29/21      PT LONG TERM GOAL #5   Title Patient will be able to go shopping independently for groceries, [employer] for up to 30 min with moderate fatigue on the weekend.    Time  8    Period Weeks    Status New    Target Date 01/29/21                   Plan - 12/18/20 1206     Clinical Impression Statement Patient reporting issues twith pelvic floor weakness today.  Revisited the basic core stabilization an included cues for drawing in , pelvic floor vs pushing out or bearing down.  Able to improve her stability in supine with cues but continued cues needed, even for cervical spine.  She reported fatigue in her lower abdominals which was new for her. She is not interested in a referral for pelvic floor PT but will try to incorporate more cues and resources for her as we cont PT.    PT Treatment/Interventions ADLs/Self Care Home Management;Moist Heat;Neuromuscular re-education;Balance training;Therapeutic exercise;Therapeutic activities;Manual techniques;Taping    PT Next Visit Plan strengthening/standing as tolerated , CORE, endurance .   Monitor RPE and HR.  did she trial doing more standing at home and doing her HEP at home. intergrate pelvic floor    PT Home Exercise Plan Access Code: [email or web address]  URL: [email or web address]  Date: 08/28/2020  Prepared by: Karie Mainland    Exercises  Supine 90/90 with Leg Extensions - 1 x daily - 7 x weekly - 2 sets - 10 reps - 5 hold  Supine Bridge - 1 x daily - 7 x weekly - 2 sets - 10 reps - 5 hold  Active Straight Leg Raise with Quad Set - 1 x daily - 7 x weekly - 2 sets - 10 reps - 5 hold  Prone Hip Extension - 1 x daily - 7 x weekly - 2 sets - 10 reps - 5 hold  Sidelying Hip Abduction - 1 x daily - 7 x weekly - 2 sets - 10 reps - 5 hold  Sidelying Hip Adduction - 1 x daily - 7 x weekly - 2 sets - 10 reps - 5 hold  Supine Shoulder Horizontal Abduction with  Resistance - 1 x daily - 7 x weekly - 2 sets - 10 reps  Seated Shoulder Front Raise with Resistance - 1 x daily - 7 x weekly - 2 sets - 10 reps    Consulted and Agree with Plan of Care Patient             Patient will benefit from skilled therapeutic  intervention in order to improve the following deficits and impairments:  Decreased balance, Decreased endurance, Decreased mobility, Difficulty walking, Postural dysfunction, Pain, Decreased strength, Decreased activity tolerance, Cardiopulmonary status limiting activity, Dizziness  Visit Diagnosis: Dizziness and giddiness  Muscle weakness (generalized)  Hypermobility syndrome  POTS (postural orthostatic tachycardia syndrome)     Problem List Patient Active Problem List   Diagnosis Date Noted   Generalized anxiety disorder 07/02/2020   Dry heaves 03/28/2020   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, involving the cardia 03/28/2020   Nausea and vomiting 03/28/2020   Ehlers-Danlos disease 11/22/2019   Chronic eczematous otitis externa of both ears 12/07/2018   Excessive cerumen in both ear canals 12/07/2018   POTS (postural orthostatic tachycardia syndrome) 11/04/2014   Benign essential hypertension 10/10/2014   Lymphadenopathy 02/10/2011    Mir Fullilove, PT 12/18/2020, 12:54 PM  [facility] [address] [place], [place], [address] Phone: [phone]   Fax:  [phone]  Name: Roberta [name] MRN: [id number] Date of Birth: [birth year]   Karie Mainland, PT 12/18/20 12:54 PM Phone: [phone] Fax: [phone]

## 2020-12-25 ENCOUNTER — Encounter: Payer: Self-pay | Admitting: Physical Therapy

## 2020-12-25 ENCOUNTER — Other Ambulatory Visit: Payer: Self-pay

## 2020-12-25 ENCOUNTER — Ambulatory Visit: Payer: Managed Care, Other (non HMO) | Attending: Cardiology | Admitting: Physical Therapy

## 2020-12-25 DIAGNOSIS — R42 Dizziness and giddiness: Secondary | ICD-10-CM | POA: Diagnosis present

## 2020-12-25 DIAGNOSIS — G90A Postural orthostatic tachycardia syndrome (POTS): Secondary | ICD-10-CM | POA: Diagnosis present

## 2020-12-25 DIAGNOSIS — M6281 Muscle weakness (generalized): Secondary | ICD-10-CM | POA: Diagnosis not present

## 2020-12-25 DIAGNOSIS — M357 Hypermobility syndrome: Secondary | ICD-10-CM | POA: Diagnosis present

## 2020-12-25 NOTE — Therapy (Signed)
[facility] Outpatient [facility] [address] [place], [place], [address] Phone: [phone]   Fax:  [phone]  Physical Therapy Treatment  Patient Details  Name: Roberta [name] MRN: [id number] Date of Birth: 11-Nov-1990 Referring Provider (PT): Dr. Christie Beckers , Dr Merri Brunette   Encounter Date: 12/25/2020   PT End of Session - 12/25/20 1111     Visit Number 18    Number of Visits 23    Date for PT Re-Evaluation 01/29/21    Authorization Type [employer]    PT Start Time 1101    PT Stop Time 1145    PT Time Calculation (min) 44 min             Past Medical History:  Diagnosis Date   Anxiety    Depression    Dumping syndrome    Ehlers-Danlos syndrome    Postural orthostatic tachycardia syndrome     Past Surgical History:  Procedure Laterality Date   BIOPSY  11/20/2020   Procedure: BIOPSY;  Surgeon: Bernette Redbird, MD;  Location: [facility];  Service: Endoscopy;;   ESOPHAGOGASTRODUODENOSCOPY N/A 11/20/2020   Procedure: ESOPHAGOGASTRODUODENOSCOPY (EGD);  Surgeon: Bernette Redbird, MD;  Location: [facility];  Service: Endoscopy;  Laterality: N/A;   INNER EAR SURGERY     UPPER GI ENDOSCOPY      There were no vitals filed for this visit.   Subjective Assessment - 12/25/20 1104     Subjective Pain in knees and hips, unsure of cause.  I did my exercises everyday this weekend.  My pelvic floor was so sore.    Currently in Pain? Yes    Pain Score 3     Pain Location Knee    Pain Orientation Right;Left;Anterior   anterior hip, knee   Pain Descriptors / Indicators Aching    Pain Type Chronic pain    Pain Onset More than a month ago    Pain Frequency Intermittent    Aggravating Factors  not sure    Pain Relieving Factors not sure              UBE for 5 min L2 Seated physio ball for core  Green Looped band  overhead lift x 10 Add rotation x 10 Green Looped band chest press    Rotation x 10   Seated march x 15 Opp arm overhead  with opp LE march x 10  Seated ball   Abduction 4 lbs single arm for anti-rotation (alternating) x8   Flexion 4 lbs (alt. ) x 8   Narrow BOS green band horizontal pull x 10   Standing hip flexor stretch x 2 each side  Core planks on mat table green ball static hold 30 sec x 3    Reformer  2 Red 1 blue : Parallel and hip ER x 15 on heels     Wide in parallel and hip ER on heels       PT Short Term Goals - 11/19/20 3151       PT SHORT TERM GOAL #1   Title Pt will be consistent and I with HEP for core, large muscle group strengthening    Status Achieved      PT SHORT TERM GOAL #2   Title Pt will use RPE and HR monitor (Fitbit) to monitor self while doing cardio    Status Achieved      PT SHORT TERM GOAL #3   Title Pt will be able to report feeling less fatigue post  work, able to do simple home tasks and not have to go right to bed.    Status On-going               PT Long Term Goals - 12/04/20 1105       PT LONG TERM GOAL #1   Title Pt will be able to show Independence with HEP and consistent cardio routine    Baseline limited by fatigue and sx post procedure    Time 8    Period Weeks    Status On-going      PT LONG TERM GOAL #2   Title Patient will be able to squat, lift up to 25 lbs x 10 with no pain, just min fatigue    Status Achieved    Target Date 01/29/21      PT LONG TERM GOAL #3   Title Pt will be able to see a NICU patient for 30 min in standing with min difficulty    Baseline can do 15 min and then has to sit    Time 8    Period Weeks    Status On-going    Target Date 01/29/21      PT LONG TERM GOAL #4   Title Pt will be able to show 5/5 strength in core and LEs    Baseline hip flex 4/5, strong in knees but poor endurance and "wobbly"    Time 8    Period Weeks    Status On-going    Target Date 01/29/21      PT LONG TERM GOAL #5   Title Patient will be able to go shopping independently for groceries, [employer] for up to 30 min with moderate  fatigue on the weekend.    Time 8    Period Weeks    Status New    Target Date 01/29/21                   Plan - 12/25/20 1331     Clinical Impression Statement Session focused on core throughout session, did well and only needed a short rest break for plank series.  She has been more compliant with HEP, motivated.  Nausea controlled with 3 x per day injections.    PT Treatment/Interventions ADLs/Self Care Home Management;Moist Heat;Neuromuscular re-education;Balance training;Therapeutic exercise;Therapeutic activities;Manual techniques;Taping    PT Next Visit Plan strengthening/standing as tolerated , CORE, endurance .   Monitor RPE and HR.  did she trial doing more standing at home and doing her HEP at home. intergrate pelvic floor    PT Home Exercise Plan Access Code: [email or web address]  URL: [email or web address]  Date: 08/28/2020  Prepared by: Karie Mainland    Exercises  Supine 90/90 with Leg Extensions - 1 x daily - 7 x weekly - 2 sets - 10 reps - 5 hold  Supine Bridge - 1 x daily - 7 x weekly - 2 sets - 10 reps - 5 hold  Active Straight Leg Raise with Quad Set - 1 x daily - 7 x weekly - 2 sets - 10 reps - 5 hold  Prone Hip Extension - 1 x daily - 7 x weekly - 2 sets - 10 reps - 5 hold  Sidelying Hip Abduction - 1 x daily - 7 x weekly - 2 sets - 10 reps - 5 hold  Sidelying Hip Adduction - 1 x daily - 7 x weekly - 2 sets - 10 reps - 5 hold  Supine Shoulder Horizontal Abduction with Resistance -  1 x daily - 7 x weekly - 2 sets - 10 reps  Seated Shoulder Front Raise with Resistance - 1 x daily - 7 x weekly - 2 sets - 10 reps    Consulted and Agree with Plan of Care Patient             Patient will benefit from skilled therapeutic intervention in order to improve the following deficits and impairments:  Decreased balance, Decreased endurance, Decreased mobility, Difficulty walking, Postural dysfunction, Pain, Decreased strength, Decreased activity tolerance, Cardiopulmonary status  limiting activity, Dizziness  Visit Diagnosis: Muscle weakness (generalized)  Dizziness and giddiness  POTS (postural orthostatic tachycardia syndrome)  Hypermobility syndrome     Problem List Patient Active Problem List   Diagnosis Date Noted   Generalized anxiety disorder 07/02/2020   Dry heaves 03/28/2020   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, involving the cardia 03/28/2020   Nausea and vomiting 03/28/2020   Ehlers-Danlos disease 11/22/2019   Chronic eczematous otitis externa of both ears 12/07/2018   Excessive cerumen in both ear canals 12/07/2018   POTS (postural orthostatic tachycardia syndrome) 11/04/2014   Benign essential hypertension 10/10/2014   Lymphadenopathy 02/10/2011    Anniston Nellums, PT 12/25/2020, 1:34 PM  [facility] [address] [place], [place], [address] Phone: [phone]   Fax:  [phone]  Name: Roberta [name] MRN: [id number] Date of Birth: 1990-05-16   Karie Mainland, PT 12/25/20 1:34 PM Phone: [phone] Fax: [phone]

## 2021-01-08 ENCOUNTER — Ambulatory Visit: Payer: Managed Care, Other (non HMO) | Admitting: Physical Therapy

## 2021-01-08 ENCOUNTER — Other Ambulatory Visit: Payer: Self-pay

## 2021-01-08 ENCOUNTER — Encounter: Payer: Self-pay | Admitting: Physical Therapy

## 2021-01-08 VITALS — BP 154/95

## 2021-01-08 DIAGNOSIS — G90A Postural orthostatic tachycardia syndrome (POTS): Secondary | ICD-10-CM

## 2021-01-08 DIAGNOSIS — M6281 Muscle weakness (generalized): Secondary | ICD-10-CM | POA: Diagnosis not present

## 2021-01-08 DIAGNOSIS — R42 Dizziness and giddiness: Secondary | ICD-10-CM

## 2021-01-08 DIAGNOSIS — M357 Hypermobility syndrome: Secondary | ICD-10-CM

## 2021-01-08 NOTE — Therapy (Signed)
[facility] Outpatient [facility] [address] [place], [place], [address] Phone: [phone]   Fax:  [phone]  Physical Therapy Treatment  Patient Details  Name: Roberta [name] MRN: [id number] Date of Birth: [birth year] Referring Provider (PT): Dr. Christie Beckers , Dr Merri Brunette   Encounter Date: 01/08/2021   PT End of Session - 01/08/21 1325     Visit Number 19    Number of Visits 23    Date for PT Re-Evaluation 01/29/21    Authorization Type [employer]    PT Start Time 1305    PT Stop Time 1345    PT Time Calculation (min) 40 min    Activity Tolerance Patient tolerated treatment well    Behavior During Therapy [facility] for tasks assessed/performed             Past Medical History:  Diagnosis Date   Anxiety    Depression    Dumping syndrome    Ehlers-Danlos syndrome    Postural orthostatic tachycardia syndrome     Past Surgical History:  Procedure Laterality Date   BIOPSY  11/20/2020   Procedure: BIOPSY;  Surgeon: Bernette Redbird, MD;  Location: [facility];  Service: Endoscopy;;   ESOPHAGOGASTRODUODENOSCOPY N/A 11/20/2020   Procedure: ESOPHAGOGASTRODUODENOSCOPY (EGD);  Surgeon: Bernette Redbird, MD;  Location: [facility];  Service: Endoscopy;  Laterality: N/A;   INNER EAR SURGERY     UPPER GI ENDOSCOPY      Vitals:   01/08/21 1327 01/08/21 1328  BP: (!) 167/91 (!) 154/95     Subjective Assessment - 01/08/21 1318     Subjective Pt stressed today.  No pain otherwise. Has had increased anxiety/depression, new meds. Cont with improved leakage but then had UTI.    Currently in Pain? No/denies                  Skilled therapy interventions:   Therapeutic Exercise:  UBE level 1 for 5 min warm up   Supine ball squeeze x 10  Bridge x 10 then hold with small ball squeeze x 10    Hip abduction   Heel taps x 15   Circles x 10 each side   Clam x 15     Standing single leg dumbbell pass/transfer for balance and anti-rotation  core   5 lbs side to side   Around the word x 10 each way   High march with unilateral shoulder flexion 3 lbs 1 min  HR  110 BP 162/94        PT Short Term Goals - 11/19/20 0852       PT SHORT TERM GOAL #1   Title Pt will be consistent and I with HEP for core, large muscle group strengthening    Status Achieved      PT SHORT TERM GOAL #2   Title Pt will use RPE and HR monitor (Fitbit) to monitor self while doing cardio    Status Achieved      PT SHORT TERM GOAL #3   Title Pt will be able to report feeling less fatigue post work, able to do simple home tasks and not have to go right to bed.    Status On-going               PT Long Term Goals - 12/04/20 1105       PT LONG TERM GOAL #1   Title Pt will be able to show Independence with HEP and consistent cardio routine  Baseline limited by fatigue and sx post procedure    Time 8    Period Weeks    Status On-going      PT LONG TERM GOAL #2   Title Patient will be able to squat, lift up to 25 lbs x 10 with no pain, just min fatigue    Status Achieved    Target Date 01/29/21      PT LONG TERM GOAL #3   Title Pt will be able to see a NICU patient for 30 min in standing with min difficulty    Baseline can do 15 min and then has to sit    Time 8    Period Weeks    Status On-going    Target Date 01/29/21      PT LONG TERM GOAL #4   Title Pt will be able to show 5/5 strength in core and LEs    Baseline hip flex 4/5, strong in knees but poor endurance and "wobbly"    Time 8    Period Weeks    Status On-going    Target Date 01/29/21      PT LONG TERM GOAL #5   Title Patient will be able to go shopping independently for groceries, [employer] for up to 30 min with moderate fatigue on the weekend.    Time 8    Period Weeks    Status New    Target Date 01/29/21                   Plan - 01/08/21 1326     Clinical Impression Statement Pt with elevated BP with headache. She was emotional today and that may  have contributed to her headache. Her job is stressful and physically exhausting. She is "wobbly" by the end of the day and usually unable to do much else at home after work.  SHe has been complaint with her pelvic floor HEP and that is helping.   Most of session focused on core and cues for spine elongation in standing.   Rest breaks as needed, support offered.    PT Treatment/Interventions ADLs/Self Care Home Management;Moist Heat;Neuromuscular re-education;Balance training;Therapeutic exercise;Therapeutic activities;Manual techniques;Taping    PT Next Visit Plan strengthening/standing as tolerated , CORE, endurance .   Monitor RPE and HR.  did she trial doing more standing at home and doing her HEP at home. intergrate pelvic floor    PT Home Exercise Plan Access Code: [email or web address]    Consulted and Agree with Plan of Care Patient             Patient will benefit from skilled therapeutic intervention in order to improve the following deficits and impairments:  Decreased balance, Decreased endurance, Decreased mobility, Difficulty walking, Postural dysfunction, Pain, Decreased strength, Decreased activity tolerance, Cardiopulmonary status limiting activity, Dizziness  Visit Diagnosis: Muscle weakness (generalized)  Dizziness and giddiness  POTS (postural orthostatic tachycardia syndrome)  Hypermobility syndrome     Problem List Patient Active Problem List   Diagnosis Date Noted   Generalized anxiety disorder 07/02/2020   Dry heaves 03/28/2020   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 [place], [place], [address] Phone: [phone]   Fax:  [phone]  Name: Roberta [name] MRN: [id number] Date of Birth: 1991-02-21  Karie Mainland, PT 01/08/21 7:53 PM Phone: [phone] Fax: [phone]

## 2021-01-09 ENCOUNTER — Encounter: Payer: Self-pay | Admitting: Adult Health

## 2021-01-09 ENCOUNTER — Ambulatory Visit (INDEPENDENT_AMBULATORY_CARE_PROVIDER_SITE_OTHER): Payer: 59 | Admitting: Adult Health

## 2021-01-09 DIAGNOSIS — F411 Generalized anxiety disorder: Secondary | ICD-10-CM | POA: Diagnosis not present

## 2021-01-09 DIAGNOSIS — F331 Major depressive disorder, recurrent, moderate: Secondary | ICD-10-CM

## 2021-01-09 NOTE — Progress Notes (Signed)
Roberta [name] [id number] Nov 11, 1990 30 y.o.  Subjective:   Patient ID:  Roberta [name] is a 30 y.o. (DOB 01-12-1991) female.  Chief Complaint: No chief complaint on file.   HPI Roberta [name] presents to the office today for follow-up of MDD and GAD.  Accompanied by friend.  Describes mood today as "ok". Pleasant. Tearful at times. Mood symptoms - reports decreased depression, anxiety and irritability. Feels like mood is improving. Stating "I feel happier". Feels like current medication regimen is helpful. Improved interest and motivation. Taking medications as prescribed.  Energy levels improved. Active, does not have a regular exercise routine with POTS.  Enjoys some usual interests and activities. Single. Lives alone with dog - mini dachshund. Family local. Spending time with family. Appetite adequate. Weight stable - 187 pounds. Sleeps well most nights. Averages 9 to 10 hours. Napping during the day. Focus and concentration stable. Completing tasks. Managing aspects of household. Works full-time - 4 10 hour days. Works as a Human resources officer at [employer]. Denies SI or HI.  Denies AH or VH.   Previous medication trials: Wellbutrin - irritability, Pristiq, Buspar, Vistaril   Flowsheet Row Admission (Discharged) from 11/20/2020 in [facility] ENDOSCOPY  C-SSRS RISK CATEGORY No Risk        Review of Systems:  Review of Systems  Musculoskeletal:  Negative for gait problem.  Neurological:  Negative for tremors.  Psychiatric/Behavioral:         Please refer to HPI   Medications: I have reviewed the patient's current medications.  Current Outpatient Medications  Medication Sig Dispense Refill   busPIRone (BUSPAR) 15 MG tablet Take 1 tablet (15 mg total) by mouth 2 (two) times daily. 180 tablet 1   cloNIDine (CATAPRES) 0.1 MG tablet Take 0.1 mg by mouth 2 (two) times daily.     desvenlafaxine (PRISTIQ) 100 MG 24 hr tablet Take 1 tablet (100 mg total) by  mouth daily. 90 tablet 1   EPINEPHrine 0.3 mg/0.3 mL IJ SOAJ injection Inject 0.3 mg into the muscle as needed for anaphylaxis.     hydrOXYzine (VISTARIL) 25 MG capsule Take 1 capsule (25 mg total) by mouth 3 (three) times daily as needed for anxiety. (Patient not taking: Reported on 11/12/2020) 30 capsule 0   ivabradine (CORLANOR) 5 MG TABS tablet Take 5 mg by mouth 2 (two) times daily with a meal.     metoprolol succinate (TOPROL-XL) 50 MG 24 hr tablet Take 50 mg by mouth daily. Take with or immediately following a meal.     octreotide (SANDOSTATIN) 50 MCG/ML SOLN injection Inject 50 mcg into the skin in the morning, at noon, and at bedtime.     ondansetron (ZOFRAN-ODT) 8 MG disintegrating tablet Take 8 mg by mouth every 8 (eight) hours as needed for nausea or vomiting.     sertraline (ZOLOFT) 100 MG tablet Take 1 tablet (100 mg total) by mouth daily. 90 tablet 1   No current facility-administered medications for this visit.    Medication Side Effects: None  Allergies:  Allergies  Allergen Reactions   Shellfish Allergy Anaphylaxis, Hives and Other (See Comments)   Shellfish-Derived Products     Other reaction(s): hives, Unknown   Penicillin G Rash    Other reaction(s): rash    Past Medical History:  Diagnosis Date   Anxiety    Depression    Dumping syndrome    Ehlers-Danlos syndrome    Postural orthostatic tachycardia syndrome  Past Medical History, Surgical history, Social history, and Family history were reviewed and updated as appropriate.   Please see review of systems for further details on the patient's review from today.   Objective:   Physical Exam:  There were no vitals taken for this visit.  Physical Exam Constitutional:      General: She is not in acute distress. Musculoskeletal:        General: No deformity.  Neurological:     Mental Status: She is alert and oriented to person, place, and time.     Coordination: Coordination normal.  Psychiatric:         Attention and Perception: Attention and perception normal. She does not perceive auditory or visual hallucinations.        Mood and Affect: Mood normal. Mood is not anxious or depressed. Affect is not labile, blunt, angry or inappropriate.        Speech: Speech normal.        Behavior: Behavior normal.        Thought Content: Thought content normal. Thought content is not paranoid or delusional. Thought content does not include homicidal or suicidal ideation. Thought content does not include homicidal or suicidal plan.        Cognition and Memory: Cognition and memory normal.        Judgment: Judgment normal.     Comments: Insight intact    Lab Review:     Component Value Date/Time   NA 139 03/24/2011 0910   K 3.7 03/24/2011 0910   CL 104 03/24/2011 0910   CO2 24 03/24/2011 0910   GLUCOSE 83 03/24/2011 0910   BUN 10 03/24/2011 0910   CREATININE 0.69 03/24/2011 0910   CALCIUM 9.5 03/24/2011 0910   PROT 7.4 03/24/2011 0910   ALBUMIN 5.1 03/24/2011 0910   AST 25 03/24/2011 0910   ALT 20 03/24/2011 0910   ALKPHOS 38 (L) 03/24/2011 0910   BILITOT 1.0 03/24/2011 0910       Component Value Date/Time   WBC 6.6 03/24/2011 0910   RBC 5.16 03/24/2011 0910   HGB 15.6 03/24/2011 0910   HCT 43.0 03/24/2011 0910   PLT 254 03/24/2011 0910   MCV 83.3 03/24/2011 0910   MCH 30.2 03/24/2011 0910   MCHC 36.3 (H) 03/24/2011 0910   RDW 12.6 03/24/2011 0910   LYMPHSABS 2.7 03/24/2011 0910   MONOABS 0.4 03/24/2011 0910   EOSABS 0.2 03/24/2011 0910   BASOSABS 0.0 03/24/2011 0910    No results found for: POCLITH, LITHIUM   No results found for: PHENYTOIN, PHENOBARB, VALPROATE, CBMZ   .res Assessment: Plan:     Plan:  PDMP reviewed  Zoloft 100mg  daily Pristiq 100mg  daily Buspar 15mg  BID   Time spent with patient was 15 minutes. Greater than 50% of face to face time with patient was spent on counseling and coordination of care.   RTC 4/6 weeks  Patient advised to contact  office with any questions, adverse effects, or acute worsening in signs and symptoms.   Diagnoses and all orders for this visit:  Major depressive disorder, recurrent episode, moderate (HCC)  Generalized anxiety disorder    Please see After Visit Summary for patient specific instructions.  Future Appointments  Date Time Provider Department Center  01/15/2021 12:30 PM [facility]  01/22/2021 12:30 PM [facility]  03/12/2021  5:20 PM Daley Gosse, [facility], NP CP-CP None    No orders of the defined types were  placed in this encounter.   -------------------------------

## 2021-01-15 ENCOUNTER — Ambulatory Visit: Payer: Managed Care, Other (non HMO) | Admitting: Physical Therapy

## 2021-01-15 ENCOUNTER — Other Ambulatory Visit: Payer: Self-pay

## 2021-01-15 ENCOUNTER — Encounter: Payer: Self-pay | Admitting: Physical Therapy

## 2021-01-15 VITALS — BP 145/81 | HR 102

## 2021-01-15 DIAGNOSIS — R42 Dizziness and giddiness: Secondary | ICD-10-CM

## 2021-01-15 DIAGNOSIS — M6281 Muscle weakness (generalized): Secondary | ICD-10-CM

## 2021-01-15 DIAGNOSIS — G90A Postural orthostatic tachycardia syndrome (POTS): Secondary | ICD-10-CM

## 2021-01-15 DIAGNOSIS — M357 Hypermobility syndrome: Secondary | ICD-10-CM

## 2021-01-15 NOTE — Therapy (Signed)
[facility] Outpatient Rehabilitation [facility] [address] [place], [place], [address] Phone: [phone]   Fax:  [phone]  Physical Therapy Treatment  Patient Details  Name: Roberta [name] MRN: [id number] Date of Birth: 12-02-1990 Referring Provider (PT): Dr. Christie Beckers , Dr Merri Brunette   Encounter Date: 01/15/2021   PT End of Session - 01/15/21 1241     Visit Number 20    Date for PT Re-Evaluation 01/29/21    Authorization Type [employer]    PT Start Time 1232    PT Stop Time 1315    PT Time Calculation (min) 43 min    Activity Tolerance Patient tolerated treatment well    Behavior During Therapy [facility] for tasks assessed/performed             Past Medical History:  Diagnosis Date   Anxiety    Depression    Dumping syndrome    Ehlers-Danlos syndrome    Postural orthostatic tachycardia syndrome     Past Surgical History:  Procedure Laterality Date   BIOPSY  11/20/2020   Procedure: BIOPSY;  Surgeon: Bernette Redbird, MD;  Location: [facility] ENDOSCOPY;  Service: Endoscopy;;   ESOPHAGOGASTRODUODENOSCOPY N/A 11/20/2020   Procedure: ESOPHAGOGASTRODUODENOSCOPY (EGD);  Surgeon: Bernette Redbird, MD;  Location: [facility] ENDOSCOPY;  Service: Endoscopy;  Laterality: N/A;   INNER EAR SURGERY     UPPER GI ENDOSCOPY      Vitals:   01/15/21 1241 01/15/21 1320  BP: (!) 146/91 (!) 145/81  Pulse: 84 (!) 102     Subjective Assessment - 01/15/21 1236     Subjective Pt had her flu shot last week and had anaphlactic reaction. MD thinks it may have sent me into a POTS flare up. High RHR, high BP, very dizzy and short of breath and dry heaving.  Blood work done, had low CO2, kidney issues.    Currently in Pain? Yes    Pain Score 2     Pain Location Knee    Pain Orientation Right;Left;Anterior    Pain Descriptors / Indicators Aching    Pain Type Chronic pain    Pain Onset More than a month ago    Pain Frequency Intermittent               Skilled therapy  interventions:   Therapeutic Exercise:  Nustep L7 UE and LE 7 min   Wall sit (30 deg isometric)  biceps 1 min  Forward raise 5 lbs x 1 min   Overhead narrow grip green band x 1 min   Lat pull down x 1 min alternating   High mat table (elbows)  Green looped band hip extension x 10   Hip abduction x 15   SLR with chest press 5 lbs x 10 x each leg   (Wide press then narrow press)   SLR with hip ER (VMO) small range x  10 with iso hold arms at 90 deg      PT Short Term Goals - 11/19/20 2595       PT SHORT TERM GOAL #1   Title Pt will be consistent and I with HEP for core, large muscle group strengthening    Status Achieved      PT SHORT TERM GOAL #2   Title Pt will use RPE and HR monitor (Fitbit) to monitor self while doing cardio    Status Achieved      PT SHORT TERM GOAL #3   Title Pt will be able to report feeling less  fatigue post work, able to do simple home tasks and not have to go right to bed.    Status On-going               PT Long Term Goals - 12/04/20 1105       PT LONG TERM GOAL #1   Title Pt will be able to show Independence with HEP and consistent cardio routine    Baseline limited by fatigue and sx post procedure    Time 8    Period Weeks    Status On-going      PT LONG TERM GOAL #2   Title Patient will be able to squat, lift up to 25 lbs x 10 with no pain, just min fatigue    Status Achieved    Target Date 01/29/21      PT LONG TERM GOAL #3   Title Pt will be able to see a NICU patient for 30 min in standing with min difficulty    Baseline can do 15 min and then has to sit    Time 8    Period Weeks    Status On-going    Target Date 01/29/21      PT LONG TERM GOAL #4   Title Pt will be able to show 5/5 strength in core and LEs    Baseline hip flex 4/5, strong in knees but poor endurance and "wobbly"    Time 8    Period Weeks    Status On-going    Target Date 01/29/21      PT LONG TERM GOAL #5   Title Patient will be able to go  shopping independently for groceries, [employer] for up to 30 min with moderate fatigue on the weekend.    Time 8    Period Weeks    Status New    Target Date 01/29/21                   Plan - 01/15/21 1253     Clinical Impression Statement Blunted HR response with exercise today. BP elevated but stable. Does well in standing if she can lean on the wall and rest between sets. Focused on time vs reps in standing.  Pt with notable fatigue and instablity with exercises, unable to keep pelvis level without manual and verbal cueing. She was feeling quite bad today but given water and rest as needed, she worked hard to continue session.    PT Treatment/Interventions ADLs/Self Care Home Management;Moist Heat;Neuromuscular re-education;Balance training;Therapeutic exercise;Therapeutic activities;Manual techniques;Taping    PT Next Visit Plan strengthening/standing as tolerated , CORE, endurance .   Monitor RPE and HR.  did she trial doing more standing at home and doing her HEP at home. intergrate pelvic floor    PT Home Exercise Plan Access Code: [email or web address]    Consulted and Agree with Plan of Care Patient             Patient will benefit from skilled therapeutic intervention in order to improve the following deficits and impairments:  Decreased balance, Decreased endurance, Decreased mobility, Difficulty walking, Postural dysfunction, Pain, Decreased strength, Decreased activity tolerance, Cardiopulmonary status limiting activity, Dizziness  Visit Diagnosis: Muscle weakness (generalized)  POTS (postural orthostatic tachycardia syndrome)  Dizziness and giddiness  Hypermobility syndrome     Problem List Patient Active Problem List   Diagnosis Date Noted   Generalized anxiety disorder 07/02/2020   Dry heaves 03/28/2020   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 [place], [place], [address] Phone: [phone]   Fax:  [phone]  Name: JUSTICE AGUIRRE MRN: [id number] Date of Birth: 10-Nov-1990   Karie Mainland, PT 01/15/21 1:22 PM Phone: [phone] Fax: [phone]

## 2021-01-22 ENCOUNTER — Ambulatory Visit: Payer: Managed Care, Other (non HMO) | Attending: Cardiology | Admitting: Physical Therapy

## 2021-01-22 ENCOUNTER — Encounter: Payer: Self-pay | Admitting: Physical Therapy

## 2021-01-22 ENCOUNTER — Other Ambulatory Visit: Payer: Self-pay

## 2021-01-22 VITALS — BP 142/81

## 2021-01-22 DIAGNOSIS — M357 Hypermobility syndrome: Secondary | ICD-10-CM | POA: Diagnosis present

## 2021-01-22 DIAGNOSIS — R42 Dizziness and giddiness: Secondary | ICD-10-CM | POA: Diagnosis present

## 2021-01-22 DIAGNOSIS — G90A Postural orthostatic tachycardia syndrome (POTS): Secondary | ICD-10-CM | POA: Insufficient documentation

## 2021-01-22 DIAGNOSIS — M6281 Muscle weakness (generalized): Secondary | ICD-10-CM | POA: Diagnosis present

## 2021-01-22 NOTE — Therapy (Signed)
[facility] Outpatient Rehabilitation [facility] [address] [place], [place], [address] Phone: [phone]   Fax:  [phone]  Physical Therapy Treatment  Patient Details  Name: Roberta [name] MRN: [id number] Date of Birth: [birth year] Referring Provider (PT): Dr. Christie Beckers , Dr Merri Brunette   Encounter Date: 01/22/2021   PT End of Session - 01/22/21 1243     Visit Number 21    Number of Visits 23    Date for PT Re-Evaluation 01/29/21    Authorization Type [employer]    PT Start Time 1238    PT Stop Time 1320    PT Time Calculation (min) 42 min    Activity Tolerance Patient tolerated treatment well    Behavior During Therapy [facility] for tasks assessed/performed             Past Medical History:  Diagnosis Date   Anxiety    Depression    Dumping syndrome    Ehlers-Danlos syndrome    Postural orthostatic tachycardia syndrome     Past Surgical History:  Procedure Laterality Date   BIOPSY  11/20/2020   Procedure: BIOPSY;  Surgeon: Bernette Redbird, MD;  Location: [facility] ENDOSCOPY;  Service: Endoscopy;;   ESOPHAGOGASTRODUODENOSCOPY N/A 11/20/2020   Procedure: ESOPHAGOGASTRODUODENOSCOPY (EGD);  Surgeon: Bernette Redbird, MD;  Location: [facility] ENDOSCOPY;  Service: Endoscopy;  Laterality: N/A;   INNER EAR SURGERY     UPPER GI ENDOSCOPY      Vitals:   01/22/21 1244  BP: (!) 142/81  SpO2: 99%     Subjective Assessment - 01/22/21 1241     Subjective Hurting all over (knees, wrists and ankles). Still very nauseous dizzy.    Currently in Pain? Yes    Pain Score --   not rated , appears no more than mod   Pain Location Generalized    Pain Orientation Right;Left    Pain Descriptors / Indicators Sore    Pain Type Chronic pain    Pain Onset More than a month ago    Pain Frequency Constant    Aggravating Factors  not sure    Pain Relieving Factors rest                [facility] PT Assessment - 01/22/21 0001       Assessment   Medical Diagnosis [name] ,  arrhythmia    Referring Provider (PT) Dr. Christie Beckers , Dr Merri Brunette      Sensation   Light Touch Appears Intact      Strength   Right Hip Extension 4+/5    Right Hip ABduction 4/5    Left Hip Extension 4+/5    Left Hip ABduction 4/5    Right Knee Flexion 5/5    Right Knee Extension 5/5    Left Knee Flexion 5/5    Left Knee Extension 5/5             [facility] Adult PT Treatment/Exercise:   Therapeutic Exercise: Supine/SL exercises for core and hip strength Knee extension /flexion  Single leg circles (semi) opp leg bent  Bridges (hamstring) with legs on bolster x 10 x 3 sets (used weights for 1 set, long hold x 1 set)  Hip adduction , abduction and circles x 10 each  Shoulder stability top arm holding 6 lb wgt for clam x 15    Manual Therapy:   Neuromuscular re-ed: N/A   Therapeutic Activity: N/A   Modalities: N/A   Self Care: N/A   Consider / progression  for next session:                PT Short Term Goals - 01/22/21 1256       PT SHORT TERM GOAL #1   Title Pt will be consistent and I with HEP for core, large muscle group strengthening    Status Achieved      PT SHORT TERM GOAL #2   Title Pt will use RPE and HR monitor (Fitbit) to monitor self while doing cardio    Status Achieved      PT SHORT TERM GOAL #3   Title Pt will be able to report feeling less fatigue post work, able to do simple home tasks and not have to go right to bed.    Status Unable to assess               PT Long Term Goals - 01/22/21 1257       PT LONG TERM GOAL #1   Title Pt will be able to show Independence with HEP and consistent cardio routine    Baseline does cardio 1 day a week (walking outside for about 20 min)    Status On-going      PT LONG TERM GOAL #2   Title Patient will be able to squat, lift up to 25 lbs x 10 with no pain, just min fatigue    Baseline limited due to pain and mostly fatigue, cues for form    Status On-going      PT LONG TERM  GOAL #3   Title Pt will be able to see a NICU patient for 30 min in standing with min difficulty    Baseline can now do 2 babies back to back- upgrade goal to 3 babies    Status On-going      PT LONG TERM GOAL #4   Title Pt will be able to show 5/5 strength in core and LEs    Baseline hip flex 4/5, strong in knees but poor endurance and "wobbly"    Status On-going      PT LONG TERM GOAL #5   Title Patient will be able to go shopping independently for groceries, [employer] for up to 30 min with moderate fatigue on the weekend.    Baseline 15 min does curbside otherwise    Status On-going      Additional Long Term Goals   Additional Long Term Goals Yes      PT LONG TERM GOAL #6   Title Pt will be able to feel more stable with work duties (FEES, XR)    Time 8    Period Weeks    Status New    Target Date 03/19/21                   Plan - 01/22/21 1245     Clinical Impression Statement Pt with more joint pain than typically present. Requested mat level exercises due to this and dizziness. She is doing HEP daily and able to now see 2 babies in a row in the NICU.  She is benefitting from skilled PT for monitored exercise and education regarding joint protections, strength, proprioception, endurance.    Stability/Clinical Decision Making Stable/Uncomplicated    Clinical Decision Making Moderate    Rehab Potential Good    PT Frequency 1x / week    PT Duration 8 weeks    PT Treatment/Interventions ADLs/Self Care Home Management;Moist Heat;Neuromuscular re-education;Balance training;Therapeutic exercise;Therapeutic activities;Manual techniques;Taping  PT Next Visit Plan FOTO.  Stand 20 lbs near wall for postural strength as tolerated. strengthening/standing as tolerated , CORE, endurance .   Monitor RPE and HR.  did she trial doing more standing at home and doing her HEP at home. intergrate pelvic floor    PT Home Exercise Plan Access Code: [email or web address]    Consulted and Agree with Plan  of Care Patient             Patient will benefit from skilled therapeutic intervention in order to improve the following deficits and impairments:  Decreased balance, Decreased endurance, Decreased mobility, Difficulty walking, Postural dysfunction, Pain, Decreased strength, Decreased activity tolerance, Cardiopulmonary status limiting activity, Dizziness  Visit Diagnosis: Muscle weakness (generalized)  POTS (postural orthostatic tachycardia syndrome)  Dizziness and giddiness  Hypermobility syndrome     Problem List Patient Active Problem List   Diagnosis Date Noted   Generalized anxiety disorder 07/02/2020   Dry heaves 03/28/2020   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 [place], [place], [address] Phone: [phone]   Fax:  [phone]  Name: Roberta [name] MRN: [id number] Date of Birth: 1990/03/30  Karie Mainland, PT 01/22/21 1:44 PM Phone: [phone] Fax: [phone]

## 2021-02-05 ENCOUNTER — Other Ambulatory Visit: Payer: Self-pay

## 2021-02-05 ENCOUNTER — Encounter: Payer: Self-pay | Admitting: Physical Therapy

## 2021-02-05 ENCOUNTER — Ambulatory Visit: Payer: Managed Care, Other (non HMO) | Admitting: Physical Therapy

## 2021-02-05 DIAGNOSIS — G90A Postural orthostatic tachycardia syndrome (POTS): Secondary | ICD-10-CM

## 2021-02-05 DIAGNOSIS — R42 Dizziness and giddiness: Secondary | ICD-10-CM

## 2021-02-05 DIAGNOSIS — M357 Hypermobility syndrome: Secondary | ICD-10-CM

## 2021-02-05 DIAGNOSIS — M6281 Muscle weakness (generalized): Secondary | ICD-10-CM | POA: Diagnosis not present

## 2021-02-05 NOTE — Therapy (Signed)
[facility] [address] [place], [place], [address] Phone: [phone]   Fax:  [phone]  Physical Therapy Treatment  Patient Details  Name: Roberta [name] MRN: [id number] Date of Birth: 11-02-1990 Referring Provider (PT): Dr. Christie Beckers , Dr Merri Brunette   Encounter Date: 02/05/2021   PT End of Session - 02/05/21 0937     Visit Number 22    Number of Visits 29    Date for PT Re-Evaluation 03/26/21    Authorization Type [employer]    PT Start Time 0932    PT Stop Time 1018    PT Time Calculation (min) 46 min    Activity Tolerance Patient tolerated treatment well    Behavior During Therapy [facility] for tasks assessed/performed             Past Medical History:  Diagnosis Date   Anxiety    Depression    Dumping syndrome    Ehlers-Danlos syndrome    Postural orthostatic tachycardia syndrome     Past Surgical History:  Procedure Laterality Date   BIOPSY  11/20/2020   Procedure: BIOPSY;  Surgeon: Bernette Redbird, MD;  Location: [facility];  Service: Endoscopy;;   ESOPHAGOGASTRODUODENOSCOPY N/A 11/20/2020   Procedure: ESOPHAGOGASTRODUODENOSCOPY (EGD);  Surgeon: Bernette Redbird, MD;  Location: [facility];  Service: Endoscopy;  Laterality: N/A;   INNER EAR SURGERY     UPPER GI ENDOSCOPY      There were no vitals filed for this visit.   Subjective Assessment - 02/05/21 0936     Subjective I have to have another ECHO to see if I have heart damage.  Joint pain in knees, hips, wrists, sternum.  Joints cont to pop.  Painful.    Currently in Pain? Yes    Pain Score 3     Pain Location Generalized                  [facility] Adult PT Treatment/Exercise:   Therapeutic Exercise: NuStep L6 UE and LE for 6 min   Gait with 15 lbs Front body carry 15lbs 3 x 150 feet Suitcase 15 lbs 2 x 150 each arm  "Hard" for patient HR in mid 90's and Sao2 98%   Dead lift 15 lbs x 5   Prone press up with hold for upper back  strength  x 10 , used hands to assist   Tall kneeling mod bridge with 15 lbs x 15  Dead bug x 5 , 5 sec hold.   Manual Therapy:   Neuromuscular re-ed: N/A   Therapeutic Activity: N/A   Modalities: N/A   Self Care: N/A   Consider / progression for next session:       Standing at wall with weight, core!      PT Short Term Goals - 01/22/21 1256       PT SHORT TERM GOAL #1   Title Pt will be consistent and I with HEP for core, large muscle group strengthening    Status Achieved      PT SHORT TERM GOAL #2   Title Pt will use RPE and HR monitor (Fitbit) to monitor self while doing cardio    Status Achieved      PT SHORT TERM GOAL #3   Title Pt will be able to report feeling less fatigue post work, able to do simple home tasks and not have to go right to bed.    Status Unable to assess  PT Long Term Goals - 01/22/21 1257       PT LONG TERM GOAL #1   Title Pt will be able to show Independence with HEP and consistent cardio routine    Baseline does cardio 1 day a week (walking outside for about 20 min)    Status On-going      PT LONG TERM GOAL #2   Title Patient will be able to squat, lift up to 25 lbs x 10 with no pain, just min fatigue    Baseline limited due to pain and mostly fatigue, cues for form    Status On-going      PT LONG TERM GOAL #3   Title Pt will be able to see a NICU patient for 30 min in standing with min difficulty    Baseline can now do 2 babies back to back- upgrade goal to 3 babies    Status On-going      PT LONG TERM GOAL #4   Title Pt will be able to show 5/5 strength in core and LEs    Baseline hip flex 4/5, strong in knees but poor endurance and "wobbly"    Status On-going      PT LONG TERM GOAL #5   Title Patient will be able to go shopping independently for groceries, [employer] for up to 30 min with moderate fatigue on the weekend.    Baseline 15 min does curbside otherwise    Status On-going      Additional  Long Term Goals   Additional Long Term Goals Yes      PT LONG TERM GOAL #6   Title Pt will be able to feel more stable with work duties (FEES, XR)    Time 8    Period Weeks    Status New    [employer] Date 03/19/21                   Plan - 02/05/21 0937     Clinical Impression Statement Pt with mild difficulty breathing and lightheaded with standing ther ex, requiring supine position to recover < 3 min . FOTO score is unchanged in 15 visits. She is in maintenance mode at this point and has not worsened.  She has been able to identify scenarios at work in which she is doing better. She worked on posterior chain to counteract weakness and the feeling of falling forward. Did well overall.    PT Treatment/Interventions ADLs/Self Care Home Management;Moist Heat;Neuromuscular re-education;Balance training;Therapeutic exercise;Therapeutic activities;Manual techniques;Taping    PT Next Visit Plan Stand 20 lbs near wall for postural strength as tolerated. strengthening/standing as tolerated , CORE, endurance .   Monitor RPE and HR.  did she trial doing more standing at home and doing her HEP at home. intergrate pelvic floor    PT Home Exercise Plan Access Code: [email or web address]    Consulted and Agree with Plan of Care Patient             Patient will benefit from skilled therapeutic intervention in order to improve the following deficits and impairments:  Decreased balance, Decreased endurance, Decreased mobility, Difficulty walking, Postural dysfunction, Pain, Decreased strength, Decreased activity tolerance, Cardiopulmonary status limiting activity, Dizziness  Visit Diagnosis: Muscle weakness (generalized)  POTS (postural orthostatic tachycardia syndrome)  Dizziness and giddiness  Hypermobility syndrome     Problem List Patient Active Problem List   Diagnosis Date Noted   Generalized anxiety disorder 07/02/2020   Dry heaves 03/28/2020   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 [place], [place], [address] Phone: [phone]   Fax:  [phone]  Name: Roberta [name] MRN: [id number] Date of Birth: 1990/04/02  ,Karie Mainland, PT 02/05/21 11:11 AM Phone: [phone] Fax: [phone]

## 2021-02-16 ENCOUNTER — Ambulatory Visit: Payer: Managed Care, Other (non HMO) | Admitting: Physical Therapy

## 2021-02-16 ENCOUNTER — Other Ambulatory Visit: Payer: Self-pay

## 2021-02-16 VITALS — BP 118/106

## 2021-02-16 DIAGNOSIS — M6281 Muscle weakness (generalized): Secondary | ICD-10-CM | POA: Diagnosis not present

## 2021-02-16 DIAGNOSIS — G90A Postural orthostatic tachycardia syndrome (POTS): Secondary | ICD-10-CM

## 2021-02-16 DIAGNOSIS — R42 Dizziness and giddiness: Secondary | ICD-10-CM

## 2021-02-16 DIAGNOSIS — M357 Hypermobility syndrome: Secondary | ICD-10-CM

## 2021-02-16 NOTE — Therapy (Signed)
[facility] [address] [place], [place], [address] Phone: [phone]   Fax:  [phone]  Physical Therapy Treatment  Patient Details  Name: Roberta [name] MRN: [id number] Date of Birth: 03-20-1991 Referring Provider (PT): Dr. Christie Beckers , Dr Merri Brunette   Encounter Date: 02/16/2021   PT End of Session - 02/16/21 0943     Visit Number 23    Number of Visits 29    Date for PT Re-Evaluation 03/26/21    Authorization Type [employer]    PT Start Time 0930    PT Stop Time 1014    PT Time Calculation (min) 44 min    Activity Tolerance Patient tolerated treatment well    Behavior During Therapy [facility] for tasks assessed/performed             Past Medical History:  Diagnosis Date   Anxiety    Depression    Dumping syndrome    Ehlers-Danlos syndrome    Postural orthostatic tachycardia syndrome     Past Surgical History:  Procedure Laterality Date   BIOPSY  11/20/2020   Procedure: BIOPSY;  Surgeon: Bernette Redbird, MD;  Location: [facility];  Service: Endoscopy;;   ESOPHAGOGASTRODUODENOSCOPY N/A 11/20/2020   Procedure: ESOPHAGOGASTRODUODENOSCOPY (EGD);  Surgeon: Bernette Redbird, MD;  Location: [facility];  Service: Endoscopy;  Laterality: N/A;   INNER EAR SURGERY     UPPER GI ENDOSCOPY      Vitals:   02/16/21 0938  BP: (!) 118/106     Subjective Assessment - 02/16/21 0933     Subjective Pt with change in symptoms.  I'm so dizzy.  I feel horrible.  Headache, nausea. My BP tanks and then it goes dark and I pass out, has happened a few times in the past 2-3 days.  The standing exercises and showers make it happen.    Currently in Pain? No/denies                 [facility] Adult PT Treatment/Exercise:   Therapeutic Exercise: Seated LAQ, 4 lbs x 20  Seated march 4 lbs x 20 cues for core stability and posture  Standing heel raise 4 lbs x 20 with 1HHA Supine bridge with heel dig 10 sec hold x 15  (BP  173/98) Supine SLR toes up x 15 reps Sidelying clam small ROM green looped band x 15 each  Hip abduction with band, fire hydrant x 10   BP upon sitting 127/96 Seated shoulder horizontal abduction green band x 15  Seated overhead lift with tension in green band x 10 , cues to maintain trunk control, head control.          PT Education - 02/16/21 1036     Education Details monitoring BP, symptoms    Person(s) Educated Patient    Methods Explanation    Comprehension Verbalized understanding              PT Short Term Goals - 01/22/21 1256       PT SHORT TERM GOAL #1   Title Pt will be consistent and I with HEP for core, large muscle group strengthening    Status Achieved      PT SHORT TERM GOAL #2   Title Pt will use RPE and HR monitor (Fitbit) to monitor self while doing cardio    Status Achieved      PT SHORT TERM GOAL #3   Title Pt will be able to report feeling less fatigue post work,  able to do simple home tasks and not have to go right to bed.    Status Unable to assess               PT Long Term Goals - 01/22/21 1257       PT LONG TERM GOAL #1   Title Pt will be able to show Independence with HEP and consistent cardio routine    Baseline does cardio 1 day a week (walking outside for about 20 min)    Status On-going      PT LONG TERM GOAL #2   Title Patient will be able to squat, lift up to 25 lbs x 10 with no pain, just min fatigue    Baseline limited due to pain and mostly fatigue, cues for form    Status On-going      PT LONG TERM GOAL #3   Title Pt will be able to see a NICU patient for 30 min in standing with min difficulty    Baseline can now do 2 babies back to back- upgrade goal to 3 babies    Status On-going      PT LONG TERM GOAL #4   Title Pt will be able to show 5/5 strength in core and LEs    Baseline hip flex 4/5, strong in knees but poor endurance and "wobbly"    Status On-going      PT LONG TERM GOAL #5   Title Patient will be  able to go shopping independently for groceries, [employer] for up to 30 min with moderate fatigue on the weekend.    Baseline 15 min does curbside otherwise    Status On-going      Additional Long Term Goals   Additional Long Term Goals Yes      PT LONG TERM GOAL #6   Title Pt will be able to feel more stable with work duties (FEES, XR)    Time 8    Period Weeks    Status New    [employer] Date 03/19/21                   Plan - 02/16/21 1014     Clinical Impression Statement Terence continues to struggle with dizziness, BP elevation and fatigue. She wastold not to exercise unsupervised due to her recent episodes of "blacking out".  BP monitored thrughout session and diastolic stayed in high 90's -010.  Kept session to seated and mat exercises for the most part. She continues to have joint instability with knees and hips "popping out" frequently. Will cont to have her monitor BP and cont to progress as she tolerates per MD recommendation.    PT Treatment/Interventions ADLs/Self Care Home Management;Moist Heat;Neuromuscular re-education;Balance training;Therapeutic exercise;Therapeutic activities;Manual techniques;Taping    PT Next Visit Plan BO updates, meds ? Stand 20 lbs near wall for postural strength as tolerated. strengthening/standing as tolerated , CORE, endurance .   Monitor RPE and HR.  did she trial doing more standing at home and doing her HEP at home. intergrate pelvic floor. Frequent BP checks    PT Home Exercise Plan Access Code: [email or web address]    Consulted and Agree with Plan of Care Patient             Patient will benefit from skilled therapeutic intervention in order to improve the following deficits and impairments:  Decreased balance, Decreased endurance, Decreased mobility, Difficulty walking, Postural dysfunction, Pain, Decreased strength, Decreased activity tolerance, Cardiopulmonary status limiting activity, Dizziness  Visit Diagnosis: Muscle weakness  (generalized)  POTS (postural orthostatic tachycardia syndrome)  Dizziness and giddiness  Hypermobility syndrome     Problem List Patient Active Problem List   Diagnosis Date Noted   Generalized anxiety disorder 07/02/2020   Dry heaves 03/28/2020   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, involving the cardia 03/28/2020   Nausea and vomiting 03/28/2020   Ehlers-Danlos disease 11/22/2019   Chronic eczematous otitis externa of both ears 12/07/2018   Excessive cerumen in both ear canals 12/07/2018   POTS (postural orthostatic tachycardia syndrome) 11/04/2014   Benign essential hypertension 10/10/2014   Lymphadenopathy 02/10/2011    Ivannah Zody, PT 02/16/2021, 10:42 AM  [facility] [address] [place], [place], [address] Phone: [phone]   Fax:  [phone]  Name: KINDRA BICKHAM MRN: [id number] Date of Birth: March 02, 1991  Karie Mainland, PT 02/16/21 10:42 AM Phone: [phone] Fax: [phone]

## 2021-02-19 ENCOUNTER — Other Ambulatory Visit: Payer: Self-pay

## 2021-02-19 ENCOUNTER — Encounter: Payer: Self-pay | Admitting: Adult Health

## 2021-02-19 ENCOUNTER — Telehealth (INDEPENDENT_AMBULATORY_CARE_PROVIDER_SITE_OTHER): Payer: 59 | Admitting: Adult Health

## 2021-02-19 DIAGNOSIS — F331 Major depressive disorder, recurrent, moderate: Secondary | ICD-10-CM

## 2021-02-19 DIAGNOSIS — F411 Generalized anxiety disorder: Secondary | ICD-10-CM | POA: Diagnosis not present

## 2021-02-19 MED ORDER — SERTRALINE HCL 100 MG PO TABS: ORAL_TABLET | ORAL | 1 refills | Status: DC

## 2021-02-19 NOTE — Progress Notes (Signed)
Roberta [name] [id number] 1991/02/14 30 y.o.  Virtual Visit via Video Note  I connected with pt @ on 02/19/21 at  5:20 PM EST by a video enabled telemedicine application and verified that I am speaking with the correct person using two identifiers.   I discussed the limitations of evaluation and management by telemedicine and the availability of in person appointments. The patient expressed understanding and agreed to proceed.  I discussed the assessment and treatment plan with the patient. The patient was provided an opportunity to ask questions and all were answered. The patient agreed with the plan and demonstrated an understanding of the instructions.   The patient was advised to call back or seek an in-person evaluation if the symptoms worsen or if the condition fails to improve as anticipated.  I provided 25 minutes of non-face-to-face time during this encounter.  The patient was located at home.  The provider was located at [facility].   Roberta Gibbs, NP   Subjective:   Patient ID:  Roberta [name] is a 30 y.o. (DOB September 05, 1990) female.  Chief Complaint: No chief complaint on file.   HPI Roberta [name] presents for follow-up of MDD and GAD.  Accompanied by friend.  Describes mood today as "ok". Pleasant. Tearful at times. Mood symptoms - reports depression, anxiety and irritability. Feels like mood has declined since last visit. Stating "I'm doing as well". Feels like current medication are not working as well. Willing to increase dose of Zoloft from 100 to 150mg  daily. Decreased interest and motivation. Taking medications as prescribed.  Energy levels lower. Active, does not have a regular exercise routine with POTS.  Enjoys some usual interests and activities. Single. Lives alone with dog - mini dachshund. Family local. Spending time with family. Appetite adequate. Weight stable - 187 pounds. Sleeps well most nights - having vivid dreams - "disturbing ones". Averages  9 to 10 hours. Napping during the day. Focus and concentration stable. Completing tasks. Managing aspects of household. Works full-time - 4 10 hour days. Works as a at Human resources officer. Denies SI or HI.  Denies AH or VH.   Previous medication trials: Wellbutrin - irritability, Pristiq, Buspar, Vistaril   Review of Systems:  Review of Systems  Musculoskeletal:  Negative for gait problem.  Neurological:  Negative for tremors.  Psychiatric/Behavioral:         Please refer to HPI   Medications: I have reviewed the patient's current medications.  Current Outpatient Medications  Medication Sig Dispense Refill   busPIRone (BUSPAR) 15 MG tablet Take 1 tablet (15 mg total) by mouth 2 (two) times daily. 180 tablet 1   cloNIDine (CATAPRES) 0.1 MG tablet Take 0.1 mg by mouth 2 (two) times daily.     desvenlafaxine (PRISTIQ) 100 MG 24 hr tablet Take 1 tablet (100 mg total) by mouth daily. 90 tablet 1   EPINEPHrine 0.3 mg/0.3 mL IJ SOAJ injection Inject 0.3 mg into the muscle as needed for anaphylaxis.     hydrOXYzine (VISTARIL) 25 MG capsule Take 1 capsule (25 mg total) by mouth 3 (three) times daily as needed for anxiety. (Patient not taking: Reported on 11/12/2020) 30 capsule 0   ivabradine (CORLANOR) 5 MG TABS tablet Take 5 mg by mouth 2 (two) times daily with a meal.     metoprolol succinate (TOPROL-XL) 50 MG 24 hr tablet Take 50 mg by mouth 2 (two) times daily. Take with or immediately following a meal.  octreotide (SANDOSTATIN) 50 MCG/ML SOLN injection Inject 50 mcg into the skin in the morning, at noon, and at bedtime.     ondansetron (ZOFRAN-ODT) 8 MG disintegrating tablet Take 8 mg by mouth every 8 (eight) hours as needed for nausea or vomiting.     sertraline (ZOLOFT) 100 MG tablet Take 1 tablet (100 mg total) by mouth daily. 90 tablet 1   No current facility-administered medications for this visit.    Medication Side Effects: None  Allergies:  Allergies   Allergen Reactions   Shellfish Allergy Anaphylaxis, Hives and Other (See Comments)   Shellfish-Derived Products     Other reaction(s): hives, Unknown   Penicillin G Rash    Other reaction(s): rash    Past Medical History:  Diagnosis Date   Anxiety    Depression    Dumping syndrome    Ehlers-Danlos syndrome    Postural orthostatic tachycardia syndrome     Family History  Problem Relation Age of Onset   Lymphoma Paternal Grandfather     Social History   Socioeconomic History   Marital status: Single    Spouse name: Not on file   Number of children: Not on file   Years of education: Not on file   Highest education level: Not on file  Occupational History   Not on file  Tobacco Use   Smoking status: Never   Smokeless tobacco: Never  Substance and Sexual Activity   Alcohol use: Not on file   Drug use: Not on file   Sexual activity: Not on file  Other Topics Concern   Not on file  Social History Narrative   Not on file   Social Determinants of Health   Financial Resource Strain: Not on file  Food Insecurity: Not on file  Transportation Needs: Not on file  Physical Activity: Not on file  Stress: Not on file  Social Connections: Not on file  Intimate Partner Violence: Not on file    Past Medical History, Surgical history, Social history, and Family history were reviewed and updated as appropriate.   Please see review of systems for further details on the patient's review from today.   Objective:   Physical Exam:  There were no vitals taken for this visit.  Physical Exam  Lab Review:     Component Value Date/Time   NA 139 03/24/2011 0910   K 3.7 03/24/2011 0910   CL 104 03/24/2011 0910   CO2 24 03/24/2011 0910   GLUCOSE 83 03/24/2011 0910   BUN 10 03/24/2011 0910   CREATININE 0.69 03/24/2011 0910   CALCIUM 9.5 03/24/2011 0910   PROT 7.4 03/24/2011 0910   ALBUMIN 5.1 03/24/2011 0910   AST 25 03/24/2011 0910   ALT 20 03/24/2011 0910   ALKPHOS 38  (L) 03/24/2011 0910   BILITOT 1.0 03/24/2011 0910       Component Value Date/Time   WBC 6.6 03/24/2011 0910   RBC 5.16 03/24/2011 0910   HGB 15.6 03/24/2011 0910   HCT 43.0 03/24/2011 0910   PLT 254 03/24/2011 0910   MCV 83.3 03/24/2011 0910   MCH 30.2 03/24/2011 0910   MCHC 36.3 (H) 03/24/2011 0910   RDW 12.6 03/24/2011 0910   LYMPHSABS 2.7 03/24/2011 0910   MONOABS 0.4 03/24/2011 0910   EOSABS 0.2 03/24/2011 0910   BASOSABS 0.0 03/24/2011 0910    No results found for: POCLITH, LITHIUM   No results found for: PHENYTOIN, PHENOBARB, VALPROATE, CBMZ   .res Assessment: Plan:  Plan:  PDMP reviewed  Zoloft 100mg  daily Pristiq 100mg  daily Buspar 15mg  BID   Time spent with patient was 15 minutes. Greater than 50% of face to face time with patient was spent on counseling and coordination of care.   RTC 4/6 weeks  Patient advised to contact office with any questions, adverse effects, or acute worsening in signs and symptoms.   There are no diagnoses linked to this encounter.   Please see After Visit Summary for patient specific instructions.  Future Appointments  Date Time Provider Department Center  02/19/2021  5:20 PM Floetta Brickey, , NP [facility] None  03/02/2021  1:30 PM 14/03/2020 [facility]  03/10/2021  9:30 AM Dimensions, PT [facility]  03/19/2021  9:30 AM Dimensions, PT [facility]    No orders of the defined types were placed in this encounter.     -------------------------------

## 2021-03-02 ENCOUNTER — Encounter: Payer: Self-pay | Admitting: Physical Therapy

## 2021-03-02 ENCOUNTER — Other Ambulatory Visit: Payer: Self-pay

## 2021-03-02 ENCOUNTER — Ambulatory Visit: Payer: Managed Care, Other (non HMO) | Attending: Cardiology | Admitting: Physical Therapy

## 2021-03-02 VITALS — BP 175/86

## 2021-03-02 DIAGNOSIS — G90A Postural orthostatic tachycardia syndrome (POTS): Secondary | ICD-10-CM | POA: Insufficient documentation

## 2021-03-02 DIAGNOSIS — R42 Dizziness and giddiness: Secondary | ICD-10-CM | POA: Diagnosis present

## 2021-03-02 DIAGNOSIS — M6281 Muscle weakness (generalized): Secondary | ICD-10-CM | POA: Insufficient documentation

## 2021-03-02 DIAGNOSIS — M357 Hypermobility syndrome: Secondary | ICD-10-CM | POA: Insufficient documentation

## 2021-03-02 NOTE — Therapy (Signed)
[facility] [address] [place], [place], [address] Phone: [phone]   Fax:  [phone]  Physical Therapy Treatment  Patient Details  Name: Roberta [name] MRN: [id number] Date of Birth: 1991-01-04 Referring Provider (PT): Dr. Christie Beckers , Dr Merri Brunette   Encounter Date: 03/02/2021   PT End of Session - 03/02/21 1342     Visit Number 24    Number of Visits 29    Date for PT Re-Evaluation 03/26/21    Authorization Type [employer]    PT Start Time 1330    PT Stop Time 1419    PT Time Calculation (min) 49 min    Activity Tolerance Patient tolerated treatment well    Behavior During Therapy [facility] for tasks assessed/performed             Past Medical History:  Diagnosis Date   Anxiety    Depression    Dumping syndrome    Ehlers-Danlos syndrome    Postural orthostatic tachycardia syndrome     Past Surgical History:  Procedure Laterality Date   BIOPSY  11/20/2020   Procedure: BIOPSY;  Surgeon: Bernette Redbird, MD;  Location: [facility];  Service: Endoscopy;;   ESOPHAGOGASTRODUODENOSCOPY N/A 11/20/2020   Procedure: ESOPHAGOGASTRODUODENOSCOPY (EGD);  Surgeon: Bernette Redbird, MD;  Location: [facility];  Service: Endoscopy;  Laterality: N/A;   INNER EAR SURGERY     UPPER GI ENDOSCOPY      Vitals:   03/02/21 1341 03/02/21 1342  BP: (!) 163/121 (!) 175/86     Subjective Assessment - 03/02/21 1337     Subjective I started my new BP meds.  my head hurts and I'm tired and nauseous.    Currently in Pain? No/denies             [facility] Adult PT Treatment/Exercise:   Therapeutic Exercise: Supine core -red band, ball Horizontal pull x 15  Diagonal pull alternating x 10  Bridging x 10 with ball  SLR from ball  Seated 141/106 after sitting up  Standing calf raise x 20 ball on table to stabilize Seated ball  March x 10  Goal post 1 lbs x 10   Bow and arrow pull red looped x 10 each   Standing ball plank 30 sec ,  unable to maintain form   BP 158/105 upon episode closing.    Consider / progression for next session:           PT Short Term Goals - 01/22/21 1256       PT SHORT TERM GOAL #1   Title Pt will be consistent and I with HEP for core, large muscle group strengthening    Status Achieved      PT SHORT TERM GOAL #2   Title Pt will use RPE and HR monitor (Fitbit) to monitor self while doing cardio    Status Achieved      PT SHORT TERM GOAL #3   Title Pt will be able to report feeling less fatigue post work, able to do simple home tasks and not have to go right to bed.    Status Unable to assess               PT Long Term Goals - 03/02/21 1424       PT LONG TERM GOAL #1   Title Pt will be able to show Independence with HEP and consistent cardio routine    Baseline does cardio 1 day a week (walking outside for  about 20 min) but varies daily based on sx    Status Achieved      PT LONG TERM GOAL #2   Title Patient will be able to squat, lift up to 25 lbs x 10 with no pain, just min fatigue    Status On-going      PT LONG TERM GOAL #3   Title Pt will be able to see a NICU patient for 30 min in standing with min difficulty    Baseline can now do 2 babies back to back- upgrade goal to 3 babies    Status On-going      PT LONG TERM GOAL #4   Title Pt will be able to show 5/5 strength in core and LEs    Status On-going      PT LONG TERM GOAL #5   Title Patient will be able to go shopping independently for groceries, [employer] for up to 30 min with moderate fatigue on the weekend.    Baseline 15 min does curbside otherwise    Status On-going      PT LONG TERM GOAL #6   Title Pt will be able to feel more stable with work duties (FEES, XR)    Status On-going                   Plan - 03/02/21 1346     Clinical Impression Statement Pt presents with familiar symptoms of hypertension, fatigue, headache, nausea.  Her BP improved with light ther ex in supine.  She has  regressed in her ability to maintain body position, even in supine.  Her MD mentioned she may have a had COVID at some point and that may have put her in this flare up.  She is on several BP meds and is still not controlled.  She needed stand by A for maintaining balance on ball.    PT Treatment/Interventions ADLs/Self Care Home Management;Moist Heat;Neuromuscular re-education;Balance training;Therapeutic exercise;Therapeutic activities;Manual techniques;Taping    PT Next Visit Plan BO updates, meds ? Stand 20 lbs near wall for postural strength as tolerated. strengthening/standing as tolerated , CORE, endurance .   Monitor RPE and HR.  did she trial doing more standing at home and doing her HEP at home. intergrate pelvic floor. Frequent BP checks    PT Home Exercise Plan Access Code: [email or web address]    Consulted and Agree with Plan of Care Patient             Patient will benefit from skilled therapeutic intervention in order to improve the following deficits and impairments:  Decreased balance, Decreased endurance, Decreased mobility, Difficulty walking, Postural dysfunction, Pain, Decreased strength, Decreased activity tolerance, Cardiopulmonary status limiting activity, Dizziness  Visit Diagnosis: Muscle weakness (generalized)  POTS (postural orthostatic tachycardia syndrome)  Dizziness and giddiness  Hypermobility syndrome     Problem List Patient Active Problem List   Diagnosis Date Noted   Generalized anxiety disorder 07/02/2020   Dry heaves 03/28/2020   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 [place], [place], [address] Phone: [phone]   Fax:  [phone]  Name:  Roberta [name] MRN: [id number] Date of Birth: [birth year]   Karie Mainland, PT 03/02/21 2:29 PM Phone: [phone] Fax: [phone]

## 2021-03-10 ENCOUNTER — Encounter: Payer: Self-pay | Admitting: Physical Therapy

## 2021-03-10 ENCOUNTER — Ambulatory Visit: Payer: Managed Care, Other (non HMO) | Admitting: Physical Therapy

## 2021-03-10 ENCOUNTER — Other Ambulatory Visit: Payer: Self-pay

## 2021-03-10 VITALS — BP 149/87 | HR 100

## 2021-03-10 DIAGNOSIS — M357 Hypermobility syndrome: Secondary | ICD-10-CM

## 2021-03-10 DIAGNOSIS — R42 Dizziness and giddiness: Secondary | ICD-10-CM

## 2021-03-10 DIAGNOSIS — M6281 Muscle weakness (generalized): Secondary | ICD-10-CM

## 2021-03-10 DIAGNOSIS — G90A Postural orthostatic tachycardia syndrome (POTS): Secondary | ICD-10-CM

## 2021-03-10 NOTE — Therapy (Signed)
[facility] [address] [place], [place], [address] Phone: [phone]   Fax:  [phone]  Physical Therapy Treatment  Patient Details  Name: Roberta [name] MRN: [id number] Date of Birth: [birth year] Referring Provider (PT): Dr. Christie Beckers , Dr Merri Brunette   Encounter Date: 03/10/2021   PT End of Session - 03/10/21 0926     Visit Number 25    Number of Visits 29    Date for PT Re-Evaluation 03/26/21    Authorization Type [employer]    PT Start Time 0928    PT Stop Time 1014    PT Time Calculation (min) 46 min    Activity Tolerance Patient tolerated treatment well    Behavior During Therapy [facility] for tasks assessed/performed             Past Medical History:  Diagnosis Date   Anxiety    Depression    Dumping syndrome    Ehlers-Danlos syndrome    Postural orthostatic tachycardia syndrome     Past Surgical History:  Procedure Laterality Date   BIOPSY  11/20/2020   Procedure: BIOPSY;  Surgeon: Bernette Redbird, MD;  Location: [facility];  Service: Endoscopy;;   ESOPHAGOGASTRODUODENOSCOPY N/A 11/20/2020   Procedure: ESOPHAGOGASTRODUODENOSCOPY (EGD);  Surgeon: Bernette Redbird, MD;  Location: [facility];  Service: Endoscopy;  Laterality: N/A;   INNER EAR SURGERY     UPPER GI ENDOSCOPY      Vitals:   03/10/21 0934 03/10/21 1007  BP: (!) 171/119 (!) 149/87  Pulse: 86 100  SpO2: 90% 97%     Subjective Assessment - 03/10/21 0929     Subjective "things have been going good.they added a 4th blood pressure medication.'                      [facility] Adult PT Treatment:     DATE: 03/10/2021 Therapeutic Exercise: Recumbent bike L2 x 8 min  RPE 4, O2 98% HR 104  Performed exercises in standing for 15 min to mimic work related endurance/ conditions Standing marching with hands on the wall 3 x 20 RPE 4 Wall wash with RUE/LUE 2 x 20 ea (10 CW/CCW) in staggered stand position RPE 5 Rolling physioball up wall 3  x 10 in "T" RPE 5  Neuromuscular re-ed: Seated balance on dyna-disc 3 x 30  Verbal cues to keep core tight and breath Seated on dyna-disc raising hands over head 2 x 10                       PT Short Term Goals - 01/22/21 1256       PT SHORT TERM GOAL #1   Title Pt will be consistent and I with HEP for core, large muscle group strengthening    Status Achieved      PT SHORT TERM GOAL #2   Title Pt will use RPE and HR monitor (Fitbit) to monitor self while doing cardio    Status Achieved      PT SHORT TERM GOAL #3   Title Pt will be able to report feeling less fatigue post work, able to do simple home tasks and not have to go right to bed.    Status Unable to assess               PT Long Term Goals - 03/02/21 1424       PT LONG TERM GOAL #1   Title Pt will be  able to show Independence with HEP and consistent cardio routine    Baseline does cardio 1 day a week (walking outside for about 20 min) but varies daily based on sx    Status Achieved      PT LONG TERM GOAL #2   Title Patient will be able to squat, lift up to 25 lbs x 10 with no pain, just min fatigue    Status On-going      PT LONG TERM GOAL #3   Title Pt will be able to see a NICU patient for 30 min in standing with min difficulty    Baseline can now do 2 babies back to back- upgrade goal to 3 babies    Status On-going      PT LONG TERM GOAL #4   Title Pt will be able to show 5/5 strength in core and LEs    Status On-going      PT LONG TERM GOAL #5   Title Patient will be able to go shopping independently for groceries, [employer] for up to 30 min with moderate fatigue on the weekend.    Baseline 15 min does curbside otherwise    Status On-going      PT LONG TERM GOAL #6   Title Pt will be able to feel more stable with work duties (FEES, XR)    Status On-going                   Plan - 03/10/21 1002     Clinical Impression Statement pt arrives to session noting she has been  doing alittle better, she had been giving and addtional medication for blood pressure since her last session. monitored BP throughout session, and worked on mimicking work related activities such as a swallow study where she had to standing for 15 min with bil UE at shoulder level, using wall for support.    Examination-Activity Limitations Lift;Stairs;Bend;Locomotion Level;Stand;Caring for Others;Reach Overhead;Carry    PT Treatment/Interventions ADLs/Self Care Home Management;Moist Heat;Neuromuscular re-education;Balance training;Therapeutic exercise;Therapeutic activities;Manual techniques;Taping    PT Next Visit Plan BO updates, meds ? Stand 20 lbs near wall for postural strength as tolerated. strengthening/standing as tolerated , CORE, endurance .   Monitor RPE and HR.  did she trial doing more standing at home and doing her HEP at home. intergrate pelvic floor. Frequent BP checks             Patient will benefit from skilled therapeutic intervention in order to improve the following deficits and impairments:  Decreased balance, Decreased endurance, Decreased mobility, Difficulty walking, Postural dysfunction, Pain, Decreased strength, Decreased activity tolerance, Cardiopulmonary status limiting activity, Dizziness  Visit Diagnosis: Muscle weakness (generalized)  POTS (postural orthostatic tachycardia syndrome)  Hypermobility syndrome  Dizziness and giddiness     Problem List Patient Active Problem List   Diagnosis Date Noted   Generalized anxiety disorder 07/02/2020   Dry heaves 03/28/2020   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, involving the cardia 03/28/2020   Nausea and vomiting 03/28/2020   Ehlers-Danlos disease 11/22/2019   Chronic eczematous otitis externa of both ears 12/07/2018   Excessive cerumen in both ear canals 12/07/2018   POTS (postural orthostatic tachycardia syndrome) 11/04/2014   Benign essential hypertension 10/10/2014   Lymphadenopathy  02/10/2011    Lulu Riding PT, DPT, LAT, ATC  03/10/21  10:15 AM     [facility] [address] [place], [place], [address] Phone: [phone]   Fax:  [phone]  Name:  Roberta [name] MRN: [id number] Date of Birth: [birth year]

## 2021-03-12 ENCOUNTER — Ambulatory Visit: Payer: 59 | Admitting: Adult Health

## 2021-03-13 ENCOUNTER — Telehealth: Payer: Self-pay

## 2021-03-19 ENCOUNTER — Encounter: Payer: Self-pay | Admitting: Physical Therapy

## 2021-03-19 ENCOUNTER — Ambulatory Visit: Payer: Managed Care, Other (non HMO) | Admitting: Physical Therapy

## 2021-03-19 ENCOUNTER — Other Ambulatory Visit: Payer: Self-pay

## 2021-03-19 VITALS — BP 135/93 | HR 80

## 2021-03-19 DIAGNOSIS — G90A Postural orthostatic tachycardia syndrome (POTS): Secondary | ICD-10-CM

## 2021-03-19 DIAGNOSIS — M357 Hypermobility syndrome: Secondary | ICD-10-CM

## 2021-03-19 DIAGNOSIS — M6281 Muscle weakness (generalized): Secondary | ICD-10-CM

## 2021-03-19 DIAGNOSIS — R42 Dizziness and giddiness: Secondary | ICD-10-CM

## 2021-03-19 NOTE — Therapy (Signed)
[facility] [address] [place], [place], [address] Phone: [phone]   Fax:  [phone]  Physical Therapy Treatment  Patient Details  Name: Roberta [name] MRN: [id number] Date of Birth: [birth year] Referring Provider (PT): Dr. Christie Beckers , Dr Merri Brunette   Encounter Date: 03/19/2021   PT End of Session - 03/19/21 0933     Visit Number 26    Number of Visits 29    Date for PT Re-Evaluation 03/26/21    Authorization Type [employer]    PT Start Time 0931    PT Stop Time 1015    PT Time Calculation (min) 44 min    Activity Tolerance Patient tolerated treatment well    Behavior During Therapy [facility] for tasks assessed/performed             Past Medical History:  Diagnosis Date   Anxiety    Depression    Dumping syndrome    Ehlers-Danlos syndrome    Postural orthostatic tachycardia syndrome     Past Surgical History:  Procedure Laterality Date   BIOPSY  11/20/2020   Procedure: BIOPSY;  Surgeon: Bernette Redbird, MD;  Location: [facility];  Service: Endoscopy;;   ESOPHAGOGASTRODUODENOSCOPY N/A 11/20/2020   Procedure: ESOPHAGOGASTRODUODENOSCOPY (EGD);  Surgeon: Bernette Redbird, MD;  Location: [facility];  Service: Endoscopy;  Laterality: N/A;   INNER EAR SURGERY     UPPER GI ENDOSCOPY      Vitals:   03/19/21 0936 03/19/21 1002  BP: (!) 151/91 (!) 135/93  Pulse: 81 80  SpO2: 97% 96%     Subjective Assessment - 03/19/21 0934     Subjective "I was pretty tired after the last session. I am waiting to get my results regarding my blood test, but have to wait until early January to see the MD."    Currently in Pain? No/denies                                 [facility] Adult PT Treatment:     DATE: 03/19/2021 Therapeutic Exercise: Nu step L 5 x 5 min  RPE 5  Performed exercises in standing for 18 min to mimic work related endurance/ conditions Standing marching with hands on the wall 3 x 20 RPE  5 Wall wash with RUE/LUE 2 x 20 ea (10 CW/CCW) in staggered stand position RPE 6 Rolling physioball up wall 2 x 10 in "W" RPE 6  * During standing  rest breaks cues to widen base of support for improved balance           PT Short Term Goals - 01/22/21 1256       PT SHORT TERM GOAL #1   Title Pt will be consistent and I with HEP for core, large muscle group strengthening    Status Achieved      PT SHORT TERM GOAL #2   Title Pt will use RPE and HR monitor (Fitbit) to monitor self while doing cardio    Status Achieved      PT SHORT TERM GOAL #3   Title Pt will be able to report feeling less fatigue post work, able to do simple home tasks and not have to go right to bed.    Status Unable to assess               PT Long Term Goals - 03/02/21 1424       PT LONG  TERM GOAL #1   Title Pt will be able to show Independence with HEP and consistent cardio routine    Baseline does cardio 1 day a week (walking outside for about 20 min) but varies daily based on sx    Status Achieved      PT LONG TERM GOAL #2   Title Patient will be able to squat, lift up to 25 lbs x 10 with no pain, just min fatigue    Status On-going      PT LONG TERM GOAL #3   Title Pt will be able to see a NICU patient for 30 min in standing with min difficulty    Baseline can now do 2 babies back to back- upgrade goal to 3 babies    Status On-going      PT LONG TERM GOAL #4   Title Pt will be able to show 5/5 strength in core and LEs    Status On-going      PT LONG TERM GOAL #5   Title Patient will be able to go shopping independently for groceries, [employer] for up to 30 min with moderate fatigue on the weekend.    Baseline 15 min does curbside otherwise    Status On-going      PT LONG TERM GOAL #6   Title Pt will be able to feel more stable with work duties (FEES, XR)    Status On-going                   Plan - 03/19/21 0956     Clinical Impression Statement [place]ria reports no pain  toda but does report feeling fatigued after the last session. repeated her last session to mimick work related tasks and conditions as much as possible in the clinic. continued performing these in standing for, during standing rest breaks she did required verbal cues to widen her BOS promote balance and reduce postural sway.    PT Treatment/Interventions ADLs/Self Care Home Management;Moist Heat;Neuromuscular re-education;Balance training;Therapeutic exercise;Therapeutic activities;Manual techniques;Taping    PT Next Visit Plan BO updates, meds ? Stand 20 lbs near wall for postural strength as tolerated. strengthening/standing as tolerated , CORE, endurance .   Monitor RPE and HR.  did she trial doing more standing at home and doing her HEP at home. intergrate pelvic floor. Frequent BP checks    PT Home Exercise Plan Access Code: [email or web address]    Consulted and Agree with Plan of Care Patient             Patient will benefit from skilled therapeutic intervention in order to improve the following deficits and impairments:  Decreased balance, Decreased endurance, Decreased mobility, Difficulty walking, Postural dysfunction, Pain, Decreased strength, Decreased activity tolerance, Cardiopulmonary status limiting activity, Dizziness  Visit Diagnosis: Muscle weakness (generalized)  POTS (postural orthostatic tachycardia syndrome)  Hypermobility syndrome  Dizziness and giddiness     Problem List Patient Active Problem List   Diagnosis Date Noted   Generalized anxiety disorder 07/02/2020   Dry heaves 03/28/2020   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, involving the cardia 03/28/2020   Nausea and vomiting 03/28/2020   Ehlers-Danlos disease 11/22/2019   Chronic eczematous otitis externa of both ears 12/07/2018   Excessive cerumen in both ear canals 12/07/2018   POTS (postural orthostatic tachycardia syndrome) 11/04/2014   Benign essential hypertension 10/10/2014   Lymphadenopathy  02/10/2011   Lulu Riding PT, DPT, LAT, ATC  03/19/21  10:15 AM      [facility]  [address] [place], [place], [address] Phone: [phone]   Fax:  [phone]  Name: KRYSTIAN FERRENTINO MRN: [id number] Date of Birth: 1990/05/02

## 2021-03-26 ENCOUNTER — Other Ambulatory Visit: Payer: Self-pay

## 2021-03-26 ENCOUNTER — Encounter: Payer: Self-pay | Admitting: Physical Therapy

## 2021-03-26 ENCOUNTER — Ambulatory Visit: Payer: Managed Care, Other (non HMO) | Attending: Cardiology | Admitting: Physical Therapy

## 2021-03-26 VITALS — BP 158/84 | HR 115

## 2021-03-26 DIAGNOSIS — G90A Postural orthostatic tachycardia syndrome (POTS): Secondary | ICD-10-CM | POA: Diagnosis present

## 2021-03-26 DIAGNOSIS — M357 Hypermobility syndrome: Secondary | ICD-10-CM | POA: Insufficient documentation

## 2021-03-26 DIAGNOSIS — R42 Dizziness and giddiness: Secondary | ICD-10-CM | POA: Insufficient documentation

## 2021-03-26 DIAGNOSIS — M6281 Muscle weakness (generalized): Secondary | ICD-10-CM | POA: Insufficient documentation

## 2021-03-26 NOTE — Therapy (Signed)
[name], [place], [address] Phone: [phone]   Fax:  [phone]  Physical Therapy Treatment / Discharge  Patient Details  Name: Roberta [name] MRN: [id number] Date of Birth: [birth year] Referring Provider (PT): Dr. Margarito Courser , Dr Deland Pretty   Encounter Date: 03/26/2021   PT End of Session - 03/26/21 1407     Visit Number 27    Number of Visits 29    Date for PT Re-Evaluation 03/26/21    Authorization Type [employer]    PT Start Time 1328    PT Stop Time 1400    PT Time Calculation (min) 32 min    Activity Tolerance Patient tolerated treatment well    Behavior During Therapy [facility] for tasks assessed/performed             Past Medical History:  Diagnosis Date   Anxiety    Depression    Dumping syndrome    Ehlers-Danlos syndrome    Postural orthostatic tachycardia syndrome     Past Surgical History:  Procedure Laterality Date   BIOPSY  11/20/2020   Procedure: BIOPSY;  Surgeon: Ronald Lobo, MD;  Location: [facility] ENDOSCOPY;  Service: Endoscopy;;   ESOPHAGOGASTRODUODENOSCOPY N/A 11/20/2020   Procedure: ESOPHAGOGASTRODUODENOSCOPY (EGD);  Surgeon: Ronald Lobo, MD;  Location: [facility];  Service: Endoscopy;  Laterality: N/A;   INNER EAR SURGERY     UPPER GI ENDOSCOPY      Vitals:   03/26/21 1333  BP: (!) 158/84  Pulse: (!) 115  SpO2: 98%     Subjective Assessment - 03/26/21 1338     Subjective "I am doing okay physically, I am concerned with the recent diagnosis and not sure what to expect."    Patient Stated Goals Patientr would like to move and not be so out of breath    Currently in Pain? No/denies                [facility] PT Assessment - 03/26/21 0001       Assessment   Medical Diagnosis Drue Dun , arrhythmia    Referring Provider (PT) Dr. Margarito Courser , Dr Deland Pretty      Strength   Right Hip Extension 4+/5    Right Hip ABduction 4/5    Left Hip Extension 4+/5     Left Hip ABduction 4/5                                      PT Short Term Goals - 03/26/21 1340       PT SHORT TERM GOAL #1   Title Pt will be consistent and I with HEP for core, large muscle group strengthening    Status Achieved      PT SHORT TERM GOAL #2   Title Pt will use RPE and HR monitor (Fitbit) to monitor self while doing cardio    Status Achieved      PT SHORT TERM GOAL #3   Title Pt will be able to report feeling less fatigue post work, able to do simple home tasks and not have to go right to bed.    Period Weeks    Status Partially Met               PT Long Term Goals - 03/26/21 1341       PT LONG TERM GOAL #1   Title Pt  will be able to show Independence with HEP and consistent cardio routine    Status Achieved      PT LONG TERM GOAL #2   Title Patient will be able to squat, lift up to 25 lbs x 10 with no pain, just min fatigue    Period Weeks    Status Achieved      PT LONG TERM GOAL #3   Title Pt will be able to see a NICU patient for 30 min in standing with min difficulty    Period Weeks    Status Achieved      PT LONG TERM GOAL #4   Title Pt will be able to show 5/5 strength in core and LEs    Period Weeks    Status Partially Met      PT LONG TERM GOAL #5   Title Patient will be able to go shopping independently for groceries, [employer] for up to 30 min with moderate fatigue on the weekend.    Baseline 15 min does curbside otherwise    Status Partially Met      PT LONG TERM GOAL #6   Title Pt will be able to feel more stable with work duties (FEES, XR)    Status Partially Met                   Plan - 03/26/21 1359     Clinical Impression Statement Roberta [name] has done well with physical therapy progressing endurance and overall function. She has met or partially met all goals today. Extensively reviewed her HEP and updated today. Based on her current LOF she has maximized her current benefit with physical  therapy and will be formerly discharged today.    PT Treatment/Interventions ADLs/Self Care Home Management;Moist Heat;Neuromuscular re-education;Balance training;Therapeutic exercise;Therapeutic activities;Manual techniques;Taping    PT Next Visit Plan D/C    PT Home Exercise Plan Access Code: [email or web address]             Patient will benefit from skilled therapeutic intervention in order to improve the following deficits and impairments:  Decreased balance, Decreased endurance, Decreased mobility, Difficulty walking, Postural dysfunction, Pain, Decreased strength, Decreased activity tolerance, Cardiopulmonary status limiting activity, Dizziness  Visit Diagnosis: Muscle weakness (generalized)  POTS (postural orthostatic tachycardia syndrome)  Hypermobility syndrome  Dizziness and giddiness     Problem List Patient Active Problem List   Diagnosis Date Noted   Generalized anxiety disorder 07/02/2020   Dry heaves 03/28/2020   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, involving the cardia 03/28/2020   Nausea and vomiting 03/28/2020   Ehlers-Danlos disease 11/22/2019   Chronic eczematous otitis externa of both ears 12/07/2018   Excessive cerumen in both ear canals 12/07/2018   POTS (postural orthostatic tachycardia syndrome) 11/04/2014   Benign essential hypertension 10/10/2014   Lymphadenopathy 02/10/2011   Starr Lake PT, DPT, LAT, ATC  03/26/21  2:08 PM      [facility] [address] [place], [place], [address] Phone: [phone]   Fax:  [phone]  Name: Roberta [name] MRN: [id number] Date of Birth: 1990/09/20       PHYSICAL THERAPY DISCHARGE SUMMARY  Visits from Start of Care: 27  Current functional level related to goals / functional outcomes: See goals   Remaining deficits: See assessment   Education / Equipment: HEP,theraband, posture, endurance training   Patient agrees to discharge.  Patient goals were partially met. Patient is being discharged due to a change in medical status. And being  pleased with her current LOF.

## 2021-04-02 ENCOUNTER — Ambulatory Visit: Payer: Managed Care, Other (non HMO) | Admitting: Physical Therapy

## 2021-04-09 ENCOUNTER — Encounter: Payer: Managed Care, Other (non HMO) | Admitting: Physical Therapy

## 2021-04-15 ENCOUNTER — Encounter: Payer: Managed Care, Other (non HMO) | Admitting: Physical Therapy

## 2021-04-15 ENCOUNTER — Other Ambulatory Visit: Payer: Self-pay

## 2021-04-15 ENCOUNTER — Encounter: Payer: Self-pay | Admitting: Adult Health

## 2021-04-15 ENCOUNTER — Ambulatory Visit (INDEPENDENT_AMBULATORY_CARE_PROVIDER_SITE_OTHER): Payer: 59 | Admitting: Adult Health

## 2021-04-15 DIAGNOSIS — F411 Generalized anxiety disorder: Secondary | ICD-10-CM

## 2021-04-15 DIAGNOSIS — F331 Major depressive disorder, recurrent, moderate: Secondary | ICD-10-CM | POA: Diagnosis not present

## 2021-04-15 NOTE — Progress Notes (Signed)
Roberta [name] [id number] Oct 21, 1990 31 y.o.  Subjective:   Patient ID:  Roberta [name] is a 31 y.o. (DOB 04-Nov-1990) female.  Chief Complaint: No chief complaint on file.   HPI SAIDI SANTACROCE presents to the office today for follow-up of MDD and GAD.  Accompanied by friend.  Describes mood today as "not the best". Pleasant. Tearful at times. Mood symptoms - reports depression, anxiety and irritability. Reports increased worry and rumination. Mood is low. Doesn't feel like medications are helping. Unable to identify a precipitant, but notes ongoing medical issues. Working with therapist regularly. Decreased interest and motivation. Taking medications as prescribed.  Energy levels lower. Active, does not have a regular exercise routine - POTS.  Enjoys some usual interests and activities. Single. Lives alone with dog - mini dachshund. Family local. Spending time with family. Appetite adequate. Weight stable - 187 pounds. Sleeps better some nights than others - nightmares.. Averages 9 to 10 hours. Napping during the day. Focus and concentration stable. Completing tasks. Managing aspects of household. Works full-time - 4 10 hour days. Works as a Human resources officer at [employer]. Denies SI or HI.  Denies AH or VH.   Previous medication trials: Wellbutrin - irritability, Pristiq, Buspar, Vistaril    Flowsheet Row Admission (Discharged) from 11/20/2020 in [facility] ENDOSCOPY  C-SSRS RISK CATEGORY No Risk        Review of Systems:  Review of Systems  Musculoskeletal:  Negative for gait problem.  Neurological:  Negative for tremors.  Psychiatric/Behavioral:         Please refer to HPI   Medications: I have reviewed the patient's current medications.  Current Outpatient Medications  Medication Sig Dispense Refill   busPIRone (BUSPAR) 15 MG tablet Take 1 tablet (15 mg total) by mouth 2 (two) times daily. 180 tablet 1   cloNIDine (CATAPRES) 0.1 MG tablet Take  0.1 mg by mouth 2 (two) times daily.     desvenlafaxine (PRISTIQ) 100 MG 24 hr tablet Take 1 tablet (100 mg total) by mouth daily. 90 tablet 1   EPINEPHrine 0.3 mg/0.3 mL IJ SOAJ injection Inject 0.3 mg into the muscle as needed for anaphylaxis.     guanFACINE (TENEX) 1 MG tablet Take 1 mg by mouth at bedtime.     hydrOXYzine (VISTARIL) 25 MG capsule Take 1 capsule (25 mg total) by mouth 3 (three) times daily as needed for anxiety. (Patient not taking: Reported on 11/12/2020) 30 capsule 0   ivabradine (CORLANOR) 5 MG TABS tablet Take 5 mg by mouth 2 (two) times daily with a meal.     metoprolol succinate (TOPROL-XL) 50 MG 24 hr tablet Take 50 mg by mouth 2 (two) times daily. Take with or immediately following a meal.     octreotide (SANDOSTATIN) 50 MCG/ML SOLN injection Inject 50 mcg into the skin in the morning, at noon, and at bedtime.     ondansetron (ZOFRAN-ODT) 8 MG disintegrating tablet Take 8 mg by mouth every 8 (eight) hours as needed for nausea or vomiting.     sertraline (ZOLOFT) 100 MG tablet Take one and 1/2 tablets daily. 135 tablet 1   No current facility-administered medications for this visit.    Medication Side Effects: None  Allergies:  Allergies  Allergen Reactions   Shellfish Allergy Anaphylaxis, Hives and Other (See Comments)   Shellfish-Derived Products     Other reaction(s): hives, Unknown   Influenza Vac A&B Sa Adj Quad     Other reaction(s):  urticaria, SOC, edema   Penicillin G Rash    Other reaction(s): rash    Past Medical History:  Diagnosis Date   Anxiety    Depression    Dumping syndrome    Ehlers-Danlos syndrome    Postural orthostatic tachycardia syndrome     Past Medical History, Surgical history, Social history, and Family history were reviewed and updated as appropriate.   Please see review of systems for further details on the patient's review from today.   Objective:   Physical Exam:  There were no vitals taken for this  visit.  Physical Exam Constitutional:      General: She is not in acute distress. Musculoskeletal:        General: No deformity.  Neurological:     Mental Status: She is alert and oriented to person, place, and time.     Coordination: Coordination normal.  Psychiatric:        Attention and Perception: Attention and perception normal. She does not perceive auditory or visual hallucinations.        Mood and Affect: Mood normal. Mood is not anxious or depressed. Affect is not labile, blunt, angry or inappropriate.        Speech: Speech normal.        Behavior: Behavior normal.        Thought Content: Thought content normal. Thought content is not paranoid or delusional. Thought content does not include homicidal or suicidal ideation. Thought content does not include homicidal or suicidal plan.        Cognition and Memory: Cognition and memory normal.        Judgment: Judgment normal.     Comments: Insight intact    Lab Review:     Component Value Date/Time   NA 139 03/24/2011 0910   K 3.7 03/24/2011 0910   CL 104 03/24/2011 0910   CO2 24 03/24/2011 0910   GLUCOSE 83 03/24/2011 0910   BUN 10 03/24/2011 0910   CREATININE 0.69 03/24/2011 0910   CALCIUM 9.5 03/24/2011 0910   PROT 7.4 03/24/2011 0910   ALBUMIN 5.1 03/24/2011 0910   AST 25 03/24/2011 0910   ALT 20 03/24/2011 0910   ALKPHOS 38 (L) 03/24/2011 0910   BILITOT 1.0 03/24/2011 0910       Component Value Date/Time   WBC 6.6 03/24/2011 0910   RBC 5.16 03/24/2011 0910   HGB 15.6 03/24/2011 0910   HCT 43.0 03/24/2011 0910   PLT 254 03/24/2011 0910   MCV 83.3 03/24/2011 0910   MCH 30.2 03/24/2011 0910   MCHC 36.3 (H) 03/24/2011 0910   RDW 12.6 03/24/2011 0910   LYMPHSABS 2.7 03/24/2011 0910   MONOABS 0.4 03/24/2011 0910   EOSABS 0.2 03/24/2011 0910   BASOSABS 0.0 03/24/2011 0910    No results found for: POCLITH, LITHIUM   No results found for: PHENYTOIN, PHENOBARB, VALPROATE, CBMZ   .res Assessment: Plan:     Plan:  PDMP reviewed  Zoloft 100mg  - 2 tabs daily Pristiq 100mg  daily Buspar 15mg  BID  Initiate Gene Sight testing.  Time spent with patient was 20 minutes. Greater than 50% of face to face time with patient was spent on counseling and coordination of care.   RTC 4/6 weeks  Patient advised to contact office with any questions, adverse effects, or acute worsening in signs and symptoms.  Diagnoses and all orders for this visit:  Major depressive disorder, recurrent episode, moderate (HCC)  Generalized anxiety disorder     Please see  After Visit Summary for patient specific instructions.  No future appointments.   No orders of the defined types were placed in this encounter.   -------------------------------

## 2021-04-16 NOTE — Telephone Encounter (Signed)
In error

## 2021-04-24 DIAGNOSIS — E538 Deficiency of other specified B group vitamins: Secondary | ICD-10-CM | POA: Insufficient documentation

## 2021-04-24 DIAGNOSIS — D509 Iron deficiency anemia, unspecified: Secondary | ICD-10-CM | POA: Insufficient documentation

## 2021-05-04 ENCOUNTER — Other Ambulatory Visit: Payer: Self-pay

## 2021-05-04 DIAGNOSIS — F411 Generalized anxiety disorder: Secondary | ICD-10-CM

## 2021-05-04 DIAGNOSIS — F331 Major depressive disorder, recurrent, moderate: Secondary | ICD-10-CM

## 2021-05-04 MED ORDER — SERTRALINE HCL 100 MG PO TABS: ORAL_TABLET | ORAL | 1 refills | Status: DC

## 2021-05-22 ENCOUNTER — Encounter: Payer: Self-pay | Admitting: Adult Health

## 2021-05-22 DIAGNOSIS — I872 Venous insufficiency (chronic) (peripheral): Secondary | ICD-10-CM | POA: Insufficient documentation

## 2021-06-08 DIAGNOSIS — D751 Secondary polycythemia: Secondary | ICD-10-CM | POA: Insufficient documentation

## 2021-06-18 ENCOUNTER — Ambulatory Visit: Payer: Managed Care, Other (non HMO) | Admitting: Physical Therapy
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
## 2021-06-24 ENCOUNTER — Encounter: Payer: Self-pay | Admitting: Physical Therapy

## 2021-06-24 ENCOUNTER — Ambulatory Visit: Payer: Managed Care, Other (non HMO) | Attending: Cardiology | Admitting: Physical Therapy

## 2021-06-24 DIAGNOSIS — R42 Dizziness and giddiness: Secondary | ICD-10-CM | POA: Diagnosis present

## 2021-06-24 DIAGNOSIS — M6281 Muscle weakness (generalized): Secondary | ICD-10-CM | POA: Diagnosis present

## 2021-06-24 DIAGNOSIS — M357 Hypermobility syndrome: Secondary | ICD-10-CM | POA: Diagnosis present

## 2021-06-24 DIAGNOSIS — G90A Postural orthostatic tachycardia syndrome (POTS): Secondary | ICD-10-CM | POA: Diagnosis present

## 2021-06-26 ENCOUNTER — Other Ambulatory Visit: Payer: Self-pay | Admitting: Adult Health

## 2021-06-26 DIAGNOSIS — F411 Generalized anxiety disorder: Secondary | ICD-10-CM

## 2021-06-26 DIAGNOSIS — F331 Major depressive disorder, recurrent, moderate: Secondary | ICD-10-CM

## 2021-06-29 NOTE — Telephone Encounter (Signed)
Please call to schedule an appt. Last seen 1/25 with RTC in 4-6 weeks.  ?

## 2021-07-07 ENCOUNTER — Ambulatory Visit: Payer: Managed Care, Other (non HMO) | Admitting: Physical Therapy

## 2021-07-07 ENCOUNTER — Encounter: Payer: Self-pay | Admitting: Physical Therapy

## 2021-07-07 VITALS — BP 161/97 | HR 88

## 2021-07-07 DIAGNOSIS — M6281 Muscle weakness (generalized): Secondary | ICD-10-CM | POA: Diagnosis not present

## 2021-07-07 DIAGNOSIS — R42 Dizziness and giddiness: Secondary | ICD-10-CM

## 2021-07-07 DIAGNOSIS — M357 Hypermobility syndrome: Secondary | ICD-10-CM

## 2021-07-07 DIAGNOSIS — G90A Postural orthostatic tachycardia syndrome (POTS): Secondary | ICD-10-CM
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
## 2021-07-09 DIAGNOSIS — R519 Headache, unspecified: Secondary | ICD-10-CM | POA: Insufficient documentation

## 2021-07-10 ENCOUNTER — Ambulatory Visit: Payer: Managed Care, Other (non HMO) | Admitting: Physical Therapy

## 2021-07-14 ENCOUNTER — Encounter: Payer: Managed Care, Other (non HMO) | Admitting: Physical Therapy
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
## 2021-07-16 ENCOUNTER — Encounter: Payer: Self-pay | Admitting: Physical Therapy

## 2021-07-16 ENCOUNTER — Ambulatory Visit: Payer: Managed Care, Other (non HMO) | Admitting: Physical Therapy

## 2021-07-16 DIAGNOSIS — R42 Dizziness and giddiness: Secondary | ICD-10-CM

## 2021-07-16 DIAGNOSIS — M6281 Muscle weakness (generalized): Secondary | ICD-10-CM

## 2021-07-16 DIAGNOSIS — M357 Hypermobility syndrome: Secondary | ICD-10-CM

## 2021-07-16 DIAGNOSIS — G90A Postural orthostatic tachycardia syndrome (POTS): Secondary | ICD-10-CM

## 2021-07-21 ENCOUNTER — Ambulatory Visit: Payer: Managed Care, Other (non HMO) | Admitting: Physical Therapy

## 2021-07-23 ENCOUNTER — Ambulatory Visit: Payer: Managed Care, Other (non HMO) | Admitting: Physical Therapy
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
## 2021-07-28 ENCOUNTER — Ambulatory Visit: Payer: Managed Care, Other (non HMO) | Admitting: Physical Therapy

## 2021-07-30 ENCOUNTER — Encounter: Payer: Self-pay | Admitting: Physical Therapy

## 2021-07-30 ENCOUNTER — Ambulatory Visit: Payer: Managed Care, Other (non HMO) | Attending: Cardiology | Admitting: Physical Therapy

## 2021-07-30 DIAGNOSIS — M357 Hypermobility syndrome: Secondary | ICD-10-CM | POA: Insufficient documentation

## 2021-07-30 DIAGNOSIS — M6281 Muscle weakness (generalized): Secondary | ICD-10-CM | POA: Diagnosis present

## 2021-07-30 DIAGNOSIS — R42 Dizziness and giddiness: Secondary | ICD-10-CM | POA: Insufficient documentation

## 2021-07-30 DIAGNOSIS — G90A Postural orthostatic tachycardia syndrome (POTS): Secondary | ICD-10-CM | POA: Diagnosis present
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
## 2021-08-06 ENCOUNTER — Ambulatory Visit: Payer: Managed Care, Other (non HMO) | Admitting: Physical Therapy

## 2021-08-06 DIAGNOSIS — M6281 Muscle weakness (generalized): Secondary | ICD-10-CM

## 2021-08-06 DIAGNOSIS — G90A Postural orthostatic tachycardia syndrome (POTS): Secondary | ICD-10-CM

## 2021-08-06 DIAGNOSIS — R42 Dizziness and giddiness: Secondary | ICD-10-CM

## 2021-08-06 DIAGNOSIS — M357 Hypermobility syndrome: Secondary | ICD-10-CM
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
## 2021-08-13 ENCOUNTER — Ambulatory Visit: Payer: Managed Care, Other (non HMO) | Admitting: Physical Therapy

## 2021-08-13 DIAGNOSIS — M6281 Muscle weakness (generalized): Secondary | ICD-10-CM | POA: Diagnosis not present

## 2021-08-13 DIAGNOSIS — G90A Postural orthostatic tachycardia syndrome (POTS): Secondary | ICD-10-CM

## 2021-08-13 DIAGNOSIS — M357 Hypermobility syndrome: Secondary | ICD-10-CM

## 2021-08-13 DIAGNOSIS — R42 Dizziness and giddiness: Secondary | ICD-10-CM
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
## 2021-08-19 DIAGNOSIS — Z1589 Genetic susceptibility to other disease: Secondary | ICD-10-CM | POA: Insufficient documentation

## 2021-08-19 DIAGNOSIS — G932 Benign intracranial hypertension: Secondary | ICD-10-CM | POA: Insufficient documentation

## 2021-08-20 DIAGNOSIS — K5900 Constipation, unspecified: Secondary | ICD-10-CM | POA: Insufficient documentation

## 2021-08-20 DIAGNOSIS — E559 Vitamin D deficiency, unspecified: Secondary | ICD-10-CM | POA: Insufficient documentation

## 2021-08-20 DIAGNOSIS — Z91013 Allergy to seafood: Secondary | ICD-10-CM | POA: Insufficient documentation

## 2021-08-20 DIAGNOSIS — T85618A Breakdown (mechanical) of other specified internal prosthetic devices, implants and grafts, initial encounter: Secondary | ICD-10-CM | POA: Insufficient documentation
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
## 2021-08-27 ENCOUNTER — Ambulatory Visit: Payer: Managed Care, Other (non HMO) | Attending: Cardiology | Admitting: Physical Therapy

## 2021-08-27 ENCOUNTER — Encounter: Payer: Self-pay | Admitting: Physical Therapy

## 2021-08-27 DIAGNOSIS — G90A Postural orthostatic tachycardia syndrome (POTS): Secondary | ICD-10-CM | POA: Insufficient documentation

## 2021-08-27 DIAGNOSIS — M357 Hypermobility syndrome: Secondary | ICD-10-CM | POA: Insufficient documentation

## 2021-08-27 DIAGNOSIS — M6281 Muscle weakness (generalized): Secondary | ICD-10-CM | POA: Insufficient documentation

## 2021-08-27 DIAGNOSIS — R42 Dizziness and giddiness: Secondary | ICD-10-CM | POA: Diagnosis present
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
## 2021-09-03 ENCOUNTER — Ambulatory Visit: Payer: Managed Care, Other (non HMO) | Admitting: Physical Therapy

## 2021-09-03 DIAGNOSIS — H4710 Unspecified papilledema: Secondary | ICD-10-CM | POA: Insufficient documentation
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
## 2021-09-10 ENCOUNTER — Ambulatory Visit: Payer: Managed Care, Other (non HMO) | Admitting: Physical Therapy

## 2021-09-10 ENCOUNTER — Encounter: Payer: Self-pay | Admitting: Physical Therapy

## 2021-09-10 DIAGNOSIS — G90A Postural orthostatic tachycardia syndrome (POTS): Secondary | ICD-10-CM | POA: Diagnosis not present

## 2021-09-10 DIAGNOSIS — M6281 Muscle weakness (generalized): Secondary | ICD-10-CM

## 2021-09-10 DIAGNOSIS — R42 Dizziness and giddiness: Secondary | ICD-10-CM

## 2021-09-10 DIAGNOSIS — M357 Hypermobility syndrome: Secondary | ICD-10-CM

## 2021-09-17 ENCOUNTER — Encounter: Payer: Self-pay | Admitting: Physical Therapy

## 2021-09-17 ENCOUNTER — Ambulatory Visit: Payer: Managed Care, Other (non HMO) | Admitting: Physical Therapy

## 2021-09-17 DIAGNOSIS — M357 Hypermobility syndrome: Secondary | ICD-10-CM

## 2021-09-17 DIAGNOSIS — G90A Postural orthostatic tachycardia syndrome (POTS): Secondary | ICD-10-CM

## 2021-09-17 DIAGNOSIS — R42 Dizziness and giddiness: Secondary | ICD-10-CM

## 2021-09-17 DIAGNOSIS — M6281 Muscle weakness (generalized): Secondary | ICD-10-CM
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
## 2021-09-23 DIAGNOSIS — G932 Benign intracranial hypertension: Secondary | ICD-10-CM | POA: Insufficient documentation

## 2021-09-24 ENCOUNTER — Ambulatory Visit: Payer: Managed Care, Other (non HMO) | Admitting: Physical Therapy

## 2021-10-01 ENCOUNTER — Encounter: Payer: Managed Care, Other (non HMO) | Admitting: Physical Therapy

## 2021-10-08 ENCOUNTER — Encounter: Payer: Managed Care, Other (non HMO) | Admitting: Physical Therapy

## 2022-01-27 DIAGNOSIS — Z982 Presence of cerebrospinal fluid drainage device: Secondary | ICD-10-CM | POA: Insufficient documentation

## 2022-02-08 NOTE — Therapy (Unsigned)
OUTPATIENT PHYSICAL THERAPY THORACOLUMBAR EVALUATION   Patient Name: Roberta [name] MRN: [id number] DOB:May 03, 1990, 31 y.o., female Today's Date: 02/09/2022  END OF SESSION:  PT End of Session - 02/09/22 1023     Visit Number 1    Number of Visits 16    Date for PT Re-Evaluation 04/06/22    Authorization Type [employer]    PT Start Time 1019    PT Stop Time 1058    PT Time Calculation (min) 39 min    Activity Tolerance Patient tolerated treatment well    Behavior During Therapy WFL for tasks assessed/performed             Past Medical History:  Diagnosis Date   Anxiety    Depression    Dumping syndrome    Ehlers-Danlos syndrome    Postural orthostatic tachycardia syndrome    Past Surgical History:  Procedure Laterality Date   BIOPSY  11/20/2020   Procedure: BIOPSY;  Surgeon: Roberta Lobo, MD;  Location: [facility];  Service: Endoscopy;;   ESOPHAGOGASTRODUODENOSCOPY N/A 11/20/2020   Procedure: ESOPHAGOGASTRODUODENOSCOPY (EGD);  Surgeon: Roberta Lobo, MD;  Location: [facility];  Service: Endoscopy;  Laterality: N/A;   INNER EAR SURGERY     UPPER GI ENDOSCOPY     Patient Active Problem List   Diagnosis Date Noted   Generalized anxiety disorder 07/02/2020   Dry heaves 03/28/2020   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, involving the cardia 03/28/2020   Nausea and vomiting 03/28/2020   Ehlers-Danlos disease 11/22/2019   Chronic eczematous otitis externa of both ears 12/07/2018   Excessive cerumen in both ear canals 12/07/2018   POTS (postural orthostatic tachycardia syndrome) 11/04/2014   Benign essential hypertension 10/10/2014   Lymphadenopathy 02/10/2011    PCP: Roberta Pretty MD  REFERRING PROVIDER: Deland Pretty MD   REFERRING DIAG: post op VP shunt   Rationale for Evaluation and Treatment: Rehabilitation  THERAPY DIAG:  Muscle weakness (generalized)  Hypermobility syndrome  POTS (postural orthostatic tachycardia syndrome)  Dizziness  and giddiness  ONSET DATE: 01/26/22  SUBJECTIVE:                                                                                                                                                                                           SUBJECTIVE STATEMENT: I haven't moved in like 3 weeks.  Today is the first day I am really out.  BPs a little bit high from the meds. Currently has antibiotic resistant UTI. Bedrest for 1 week then.  Symptoms Fatigue, pain in head, low back (UTI), weakness, dizziness.  Not working until 02/25/22 if MD allows (1/2 days)/   Today  I am having a PICC line to treat UTI. Denies joint pain or instability. No appetite, nausea.  Is now back in her home and feeling extremely fatigued.   PERTINENT HISTORY:  Neurosurgery note: 02/04/22  Roberta [name] is a  31 y.o. old female with Ehlers-Danlos who developed elevated intracranial pressures and we placed a transverse/sigmoid stent.  After that, her headaches went from a 10 down to a 7  She is continue to have headaches and recently underwent a lumbar puncture.  This demonstrated an opening pressure of 23 cm.  After the lumbar puncture her headaches got significantly better and they are down to a 2 out of 10.  This relief lasted for approximately 3 days and then her headaches returned. After much discussion with the patient she was taken to the operating room on 01/26/2022 for right-sided ventriculoperitoneal shunt insertion.  Postoperatively we have been following this for very closely as she was having refractory nausea and vomiting. We first dialed the shunt up to 170 mm of H2O. Unfortunately she continued to have nausea vomiting was seen in the emergency department. She was given some IV fluids and we subsequently dialed her shunt all the way up to 200 mm of H2O. She has since been seen by infectious disease due to recurrent UTIs.  Today she returns and reports that she continues to to have some intermittent nausea vomiting but it  has improved since the shunt adjustment and antibiotics. Unfortunately she reports that her UTI is not improving and that infectious disease is going to place a PICC line for antibiotic therapy.  Today she reports that her headaches are currently a 5 out of 10 which is an improvement from previously as they were a 7-8 out of 10 prior to shunt insertion. She denies any changes in her vision but is scheduled to see her ophthalmologist in the coming days. She denies any chest pain, heart palpitations, shortness breath or any recent fevers or chills. Incisions are healing nicely without any redness drainage or any wound dehiscence.   PAIN:  Are you having pain? Yes: NPRS scale: 4/10 Pain location: Rt side of head  Pain description: incisional pain  Aggravating factors: nothing  Relieving factors: nothing   Yes: NPRS scale: 3/10 Pain location: back  Pain description: achy sore Aggravating factors: standing, walking, moving Relieving factors: sitting and rest     PRECAUTIONS: Other: No forward bending for prolonged time.   WEIGHT BEARING RESTRICTIONS: No  FALLS:  Has patient fallen in last 6 months? Yes. Number of falls x 1 in July after the 1st surgery  Balance is still an issue  LIVING ENVIRONMENT: Lives with: lives with their family and lives alone family nearby she lived with them for 2 weeks after surgery.  Lives in: House/apartment Stairs: No Has following equipment at home: None  OCCUPATION: SLP acute care at [employer]  PLOF: Independent  PATIENT GOALS: Do the basic things without feeling like I'm dying.   NEXT MD VISIT:   OBJECTIVE:   DIAGNOSTIC FINDINGS:  none  VITALS:  SaO2 99% with HR 95   131/92    PATIENT SURVEYS:  FAS :Fatigue Assesment Scale TBA  SCREENING FOR RED FLAGS: Bowel or bladder incontinence: No Spinal tumors: No Cauda equina syndrome: No Compression fracture: No Abdominal aneurysm: No  COGNITION: Overall cognitive status: Within  functional limits for tasks assessed     SENSATION: WFL  MUSCLE LENGTH: Hamstrings: NT Thomas test: [place]  POSTURE: rounded shoulders and forward head  PALPATION: NT   LUMBAR ROM:  NT ON EVAL  AROM eval  Flexion   Extension   Right lateral flexion   Left lateral flexion   Right rotation   Left rotation    (Blank rows = not tested)  LOWER EXTREMITY MMT:    MMT Right eval Left eval  Hip flexion 3+ 4-  Hip extension 4- 4-  Hip abduction 3 3+  Hip adduction    Hip internal rotation    Hip external rotation    Knee flexion 4+ 4+  Knee extension 4+ 4  Ankle dorsiflexion    Ankle plantarflexion    Ankle inversion    Ankle eversion    Core strength 3/5:  12 sec table top hold legs drop and back arches  Single leg bridge 28 sec LLE and RLE 20 sec  Trunk compensation in MMT '  SENSATION: WFL   FUNCTIONAL TESTS:  5 times sit to stand: 17 sec 30 seconds chair stand test 2 minute walk test: 443 feet Borg 5  GAIT: Distance walked: 443 Assistive device utilized: None Level of assistance: Modified independence Comments: increased postural sway, irregular path of direction, apparent core instability   LUMBAR SPECIAL TESTS:  NT   TODAY'S TREATMENT:                                                                                                                              DATE: 02/09/22   5 x sts 17 sec  30 sec: 7 reps, 103 HR   2 min walk test 443 feet 5/10 on Borg scale   PATIENT EDUCATION:  Education details: PT, POC  Person educated: Patient Education method: Explanation and Verbal cues Education comprehension: verbalized understanding  HOME EXERCISE PROGRAM: None at this time.  Asked her to work on getting up from sitting/lying every hour, work on increasing daily activity in her home.   ASSESSMENT:  CLINICAL IMPRESSION: Patient is a 31 y.o. 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  OBJECTIVE IMPAIRMENTS: Abnormal gait, cardiopulmonary status limiting activity, decreased activity tolerance, decreased balance, decreased coordination, decreased endurance, decreased mobility, difficulty walking, decreased strength, dizziness, postural dysfunction, and pain.   ACTIVITY LIMITATIONS: carrying, lifting, bending, standing, squatting, stairs, bathing, hygiene/grooming, and locomotion level  PARTICIPATION LIMITATIONS: meal prep, cleaning, laundry, interpersonal relationship, shopping, community activity, and occupation  PERSONAL FACTORS: Past/current experiences and 3+ comorbidities: EDS, type III, POTS, chronic UTI  are also affecting patient's functional outcome.   REHAB POTENTIAL: Excellent  CLINICAL DECISION MAKING: Unstable/unpredictable  EVALUATION COMPLEXITY: High   GOALS: Goals reviewed with patient? Yes  SHORT TERM GOALS: Target date 03/09/22   Patient will be independent with basic home exercise program for endurance and lower body strength Baseline: Goal status: INITIAL  2.  Patient will complete fatigue assessment scale and goal set (FAS) Baseline:  Goal status: INITIAL  3.  Patient will tolerate out of bed every hour for 10 minutes for 6 hours/day (60 minutes)  Baseline:  Goal status: INITIAL   LONG TERM GOALS: Target date: 04/06/22  Pt would like to be able to shop for 30-40 min with moderate fatigue  Baseline: unable, severe  Goal status: INITIAL  2.  Pt work part time with (5 hours) with no more than minimal back pain 3 out of 5 days/week Baseline: unable to work for another 2 weeks  Goal status: INITIAL  3.  Pt will complete ADLs, dressing showering with minimal fatigue Baseline: very fatigued Goal status: INITIAL  4.  Patient will be independent in final home exercise program upon discharge Baseline:  Goal status: INITIAL  5.   FAS goal to be assessed Baseline:  Goal status: INITIAL  6.  Patient will demonstrate 5/5 lower extremity strength to optimize ambulation, transfers. Baseline:  Goal status: INITIAL   7. Patient will hold 90/90 position with lower body for 30 sec and no loss of neutral spine to demo increased core   Baseline: 12 sec    Goal status: INITIAL PLAN:  PT FREQUENCY: 2x/week  PT DURATION: 8 weeks  PLANNED INTERVENTIONS: Therapeutic exercises, Therapeutic activity, Neuromuscular re-education, Balance training, Gait training, Patient/Family education, Self Care, Cryotherapy, Moist heat, Manual therapy, and Re-evaluation.  PLAN FOR NEXT SESSION: FAS, HEP for core.  Monitor vitals, bike/UBE/NuStep    Akin Yi, PT 02/09/2022, 3:34 PM   [name], PT 02/09/22 4:40 PM Phone: [phone] Fax: [phone]

## 2022-02-09 ENCOUNTER — Ambulatory Visit: Payer: Managed Care, Other (non HMO) | Attending: Internal Medicine | Admitting: Physical Therapy

## 2022-02-09 ENCOUNTER — Encounter: Payer: Self-pay | Admitting: Physical Therapy

## 2022-02-09 DIAGNOSIS — M357 Hypermobility syndrome: Secondary | ICD-10-CM | POA: Insufficient documentation

## 2022-02-09 DIAGNOSIS — M6281 Muscle weakness (generalized): Secondary | ICD-10-CM | POA: Insufficient documentation

## 2022-02-09 DIAGNOSIS — R42 Dizziness and giddiness: Secondary | ICD-10-CM | POA: Insufficient documentation

## 2022-02-09 DIAGNOSIS — G90A Postural orthostatic tachycardia syndrome (POTS): Secondary | ICD-10-CM | POA: Insufficient documentation

## 2022-02-09 DIAGNOSIS — B9629 Other Escherichia coli [E. coli] as the cause of diseases classified elsewhere: Secondary | ICD-10-CM | POA: Insufficient documentation

## 2022-02-09 DIAGNOSIS — N390 Urinary tract infection, site not specified: Secondary | ICD-10-CM | POA: Insufficient documentation

## 2022-02-15 NOTE — Therapy (Unsigned)
OUTPATIENT PHYSICAL THERAPY TREATMENT NOTE   Patient Name: Roberta [name] MRN: [id number] DOB:Dec 17, 1990, 31 y.o., female Today's Date: 02/15/2022  PCP: Marland Kitchen REFERRING PROVIDER: ***  END OF SESSION:    Past Medical History:  Diagnosis Date   Anxiety    Depression    Dumping syndrome    Ehlers-Danlos syndrome    Postural orthostatic tachycardia syndrome    Past Surgical History:  Procedure Laterality Date   BIOPSY  11/20/2020   Procedure: BIOPSY;  Surgeon: Ronald Lobo, MD;  Location: [facility];  Service: Endoscopy;;   ESOPHAGOGASTRODUODENOSCOPY N/A 11/20/2020   Procedure: ESOPHAGOGASTRODUODENOSCOPY (EGD);  Surgeon: Ronald Lobo, MD;  Location: [facility];  Service: Endoscopy;  Laterality: N/A;   INNER EAR SURGERY     UPPER GI ENDOSCOPY     Patient Active Problem List   Diagnosis Date Noted   Generalized anxiety disorder 07/02/2020   Dry heaves 03/28/2020   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  Rationale for Evaluation and Treatment {HABREHAB:27488}  PERTINENT HISTORY: ***  PRECAUTIONS: ***  SUBJECTIVE:                                                                                                                                                                                      SUBJECTIVE STATEMENT:  ***   PAIN:  Are you having pain? {OPRCPAIN:27236}   OBJECTIVE: (objective measures completed at initial evaluation unless otherwise dated)  Outpatient Rehab  02/09/2022 [facility] [address]    Clint Guy,  [place] [facility] Muscle weakness (generalized) +3 more Dx PT Initial Evaluation; Referred by Deland Pretty, MD Reason for Visit   Additional Documentation  Flowsheets: Screening,   Healthcare Directives,   PT End of Session  Encounter Info: Billing Info,   History,   Allergies,   Detailed Report   All Notes   Therapy by Clint Guy, PT at 02/09/2022 10:15 AM  Author: Clint Guy, PT Author Type: Physical Therapist Filed: 02/09/2022  4:47 PM  Note Status: Signed Cosign: Cosign Not Required Encounter Date: 02/09/2022  Editor: Clint Guy, PT (Physical Therapist)              [place] EVALUATION     Patient Name: Roberta [name] MRN: [id number] DOB:08/06/1990, 31 y.o.,  female Today's Date: 02/09/2022   END OF SESSION:   PT End of Session - 02/09/22 1023       Visit Number 1     Number of Visits 16     Date for PT Re-Evaluation 04/06/22     Authorization Type [employer]     PT Start Time 1019     PT Stop Time 1058     PT Time Calculation (min) 39 min     Activity Tolerance Patient tolerated treatment well     Behavior During Therapy WFL for tasks assessed/performed                       Past Medical History:  Diagnosis Date   Anxiety     Depression     Dumping syndrome     Ehlers-Danlos syndrome     Postural orthostatic tachycardia syndrome           Past Surgical History:  Procedure Laterality Date   BIOPSY   11/20/2020    Procedure: BIOPSY;  Surgeon: Ronald Lobo, MD;  Location: [facility];  Service: Endoscopy;;   ESOPHAGOGASTRODUODENOSCOPY N/A 11/20/2020    Procedure: ESOPHAGOGASTRODUODENOSCOPY (EGD);  Surgeon: Ronald Lobo, MD;  Location: [facility];  Service: Endoscopy;  Laterality: N/A;   INNER EAR SURGERY       UPPER GI ENDOSCOPY            Patient Active Problem List    Diagnosis Date Noted   Generalized anxiety disorder 07/02/2020   Dry heaves 03/28/2020   Gastric intestinal metaplasia  without dysplasia, involving the cardia 03/28/2020   Nausea and vomiting 03/28/2020   Ehlers-Danlos disease 11/22/2019   Chronic eczematous otitis externa of both ears 12/07/2018   Excessive cerumen in both ear canals 12/07/2018   POTS (postural orthostatic tachycardia syndrome) 11/04/2014   Benign essential hypertension 10/10/2014   Lymphadenopathy 02/10/2011      PCP: Deland Pretty MD   REFERRING PROVIDER: Deland Pretty MD    REFERRING DIAG: post op VP shunt    Rationale for Evaluation and Treatment: Rehabilitation   THERAPY DIAG:  Muscle weakness (generalized)   Hypermobility syndrome   POTS (postural orthostatic tachycardia syndrome)   Dizziness and giddiness   ONSET DATE: 01/26/22   SUBJECTIVE:                                                                                                                                                                                            SUBJECTIVE STATEMENT: I haven't moved in like 3 weeks.  Today is the first day I am really out.  BPs  a little bit high from the meds. Currently has antibiotic resistant UTI. Bedrest for 1 week then.  Symptoms Fatigue, pain in head, low back (UTI), weakness, dizziness.  Not working until 02/25/22 if MD allows (1/2 days)/   Today I am having a PICC line to treat UTI. Denies joint pain or instability. No appetite, nausea.  Is now back in her home and feeling extremely fatigued.    PERTINENT HISTORY:  Neurosurgery note: 02/04/22   Roberta [name] is a  31 y.o. old female with Ehlers-Danlos who developed elevated intracranial pressures and we placed a transverse/sigmoid stent.  After that, her headaches went from a 10 down to a 7  She is continue to have headaches and recently underwent a lumbar puncture.  This demonstrated an opening pressure of 23 cm.  After the lumbar puncture her headaches got significantly better and they are down to a 2 out of 10.  This relief lasted for approximately 3 days and then  her headaches returned. After much discussion with the patient she was taken to the operating room on 01/26/2022 for right-sided ventriculoperitoneal shunt insertion.  Postoperatively we have been following this for very closely as she was having refractory nausea and vomiting. We first dialed the shunt up to 170 mm of H2O. Unfortunately she continued to have nausea vomiting was seen in the emergency department. She was given some IV fluids and we subsequently dialed her shunt all the way up to 200 mm of H2O. She has since been seen by infectious disease due to recurrent UTIs.  Today she returns and reports that she continues to to have some intermittent nausea vomiting but it has improved since the shunt adjustment and antibiotics. Unfortunately she reports that her UTI is not improving and that infectious disease is going to place a PICC line for antibiotic therapy.  Today she reports that her headaches are currently a 5 out of 10 which is an improvement from previously as they were a 7-8 out of 10 prior to shunt insertion. She denies any changes in her vision but is scheduled to see her ophthalmologist in the coming days. She denies any chest pain, heart palpitations, shortness breath or any recent fevers or chills. Incisions are healing nicely without any redness drainage or any wound dehiscence.     PAIN:  Are you having pain? Yes: NPRS scale: 4/10 Pain location: Rt side of head  Pain description: incisional pain  Aggravating factors: nothing  Relieving factors: nothing    Yes: NPRS scale: 3/10 Pain location: back  Pain description: achy sore Aggravating factors: standing, walking, moving Relieving factors: sitting and rest       PRECAUTIONS: Other: No forward bending for prolonged time.    WEIGHT BEARING RESTRICTIONS: No   FALLS:  Has patient fallen in last 6 months? Yes. Number of falls x 1 in July after the 1st surgery  Balance is still an issue   LIVING ENVIRONMENT: Lives  with: lives with their family and lives alone family nearby she lived with them for 2 weeks after surgery.  Lives in: House/apartment Stairs: No Has following equipment at home: None   OCCUPATION: SLP acute care at [employer]   PLOF: Independent   PATIENT GOALS: Do the basic things without feeling like I'm dying.    NEXT MD VISIT:    OBJECTIVE:    DIAGNOSTIC FINDINGS:  none   VITALS:            SaO2 99% with HR 95  131/92       PATIENT SURVEYS:  FAS :Fatigue Assesment Scale TBA   SCREENING FOR RED FLAGS: Bowel or bladder incontinence: No Spinal tumors: No Cauda equina syndrome: No Compression fracture: No Abdominal aneurysm: No   COGNITION: Overall cognitive status: Within functional limits for tasks assessed                          SENSATION: WFL   MUSCLE LENGTH: Hamstrings: NT Thomas test: [place]   POSTURE: rounded shoulders and forward head   PALPATION: NT    LUMBAR ROM:  NT ON EVAL  AROM eval  Flexion    Extension    Right lateral flexion    Left lateral flexion    Right rotation    Left rotation     (Blank rows = not tested)   LOWER EXTREMITY MMT:     MMT Right eval Left eval  Hip flexion 3+ 4-  Hip extension 4- 4-  Hip abduction 3 3+  Hip adduction      Hip internal rotation      Hip external rotation      Knee flexion 4+ 4+  Knee extension 4+ 4  Ankle dorsiflexion      Ankle plantarflexion      Ankle inversion      Ankle eversion      Core strength 3/5:  12 sec table top hold legs drop and back arches  Single leg bridge 28 sec LLE and RLE 20 sec  Trunk compensation in MMT '   SENSATION: WFL     FUNCTIONAL TESTS:  5 times sit to stand: 17 sec 30 seconds chair stand test 2 minute walk test: 443 feet Borg 5   GAIT: Distance walked: 443 Assistive device utilized: None Level of assistance: Modified independence Comments: increased postural sway, irregular path of direction, apparent core instability    LUMBAR  SPECIAL TESTS:  NT     TODAY'S TREATMENT:                                                                                                                              DATE: 02/09/22    5 x sts 17 sec  30 sec: 7 reps, 103 HR    2 min walk test 443 feet 5/10 on Borg scale    PATIENT EDUCATION:  Education details: PT, POC  Person educated: Patient Education method: Explanation and Verbal cues Education comprehension: verbalized understanding   HOME EXERCISE PROGRAM: None at this time.  Asked her to work on getting up from sitting/lying every hour, work on increasing daily activity in her home.    ASSESSMENT:   CLINICAL IMPRESSION: Patient is a 31 y.o. 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   OBJECTIVE IMPAIRMENTS: Abnormal gait, cardiopulmonary status limiting activity, decreased  activity tolerance, decreased balance, decreased coordination, decreased endurance, decreased mobility, difficulty walking, decreased strength, dizziness, postural dysfunction, and pain.    ACTIVITY LIMITATIONS: carrying, lifting, bending, standing, squatting, stairs, bathing, hygiene/grooming, and locomotion level   PARTICIPATION LIMITATIONS: meal prep, cleaning, laundry, interpersonal relationship, shopping, community activity, and occupation   PERSONAL FACTORS: Past/current experiences and 3+ comorbidities: EDS, type III, POTS, chronic UTI  are also affecting patient's functional outcome.    REHAB POTENTIAL: Excellent   CLINICAL DECISION MAKING: Unstable/unpredictable   EVALUATION COMPLEXITY: High     GOALS: Goals reviewed with patient? Yes   SHORT TERM GOALS: Target date 03/09/22     Patient will be independent with basic home exercise program for endurance and lower body strength Baseline: Goal status: INITIAL   2.   Patient will complete fatigue assessment scale and goal set (FAS) Baseline:  Goal status: INITIAL   3.  Patient will tolerate out of bed every hour for 10 minutes for 6 hours/day (60 minutes)  Baseline:  Goal status: INITIAL     LONG TERM GOALS: Target date: 04/06/22   Pt would like to be able to shop for 30-40 min with moderate fatigue  Baseline: unable, severe  Goal status: INITIAL   2.  Pt work part time with (5 hours) with no more than minimal back pain 3 out of 5 days/week Baseline: unable to work for another 2 weeks  Goal status: INITIAL   3.  Pt will complete ADLs, dressing showering with minimal fatigue Baseline: very fatigued Goal status: INITIAL   4.  Patient will be independent in final home exercise program upon discharge Baseline:  Goal status: INITIAL   5.  FAS goal to be assessed Baseline:  Goal status: INITIAL   6.  Patient will demonstrate 5/5 lower extremity strength to optimize ambulation, transfers. Baseline:  Goal status: INITIAL             7. Patient will hold 90/90 position with lower body for 30 sec and no loss of neutral spine to demo increased core                      Baseline: 12 sec                       Goal status: INITIAL PLAN:   PT FREQUENCY: 2x/week   PT DURATION: 8 weeks   PLANNED INTERVENTIONS: Therapeutic exercises, Therapeutic activity, Neuromuscular re-education, Balance training, Gait training, Patient/Family education, Self Care, Cryotherapy, Moist heat, Manual therapy, and Re-evaluation.   PLAN FOR NEXT SESSION: FAS, HEP for core.  Monitor vitals, bike/UBE/NuStep      Wakeelah Solan, PT 02/09/2022, 3:34 PM    [name], PT 02/09/22 4:40 PM Phone: [phone] Fax: [phone]       (Copy Eval's Objective through Plan section here)   Luciann Gossett, PT 02/15/2022, 12:06 PM

## 2022-02-16 ENCOUNTER — Ambulatory Visit: Payer: Managed Care, Other (non HMO) | Admitting: Physical Therapy

## 2022-02-16 ENCOUNTER — Encounter: Payer: Self-pay | Admitting: Physical Therapy

## 2022-02-16 DIAGNOSIS — R42 Dizziness and giddiness: Secondary | ICD-10-CM

## 2022-02-16 DIAGNOSIS — M357 Hypermobility syndrome: Secondary | ICD-10-CM

## 2022-02-16 DIAGNOSIS — G90A Postural orthostatic tachycardia syndrome (POTS): Secondary | ICD-10-CM

## 2022-02-16 DIAGNOSIS — M6281 Muscle weakness (generalized): Secondary | ICD-10-CM

## 2022-02-16 NOTE — Therapy (Signed)
OUTPATIENT PHYSICAL THERAPY TREATMENT NOTE   Patient Name: Roberta [name] MRN: [id number] DOB:13-Jan-1991, 31 y.o., female Today's Date: 02/16/2022  PCP: Roberta Brunette MD REFERRING PROVIDER: Merri Brunette MD   END OF SESSION:   PT End of Session - 02/16/22 1539     Visit Number 2    Number of Visits 16    Date for PT Re-Evaluation 04/06/22    Authorization Type [employer]    PT Start Time 1537    PT Stop Time 1619    PT Time Calculation (min) 42 min    Activity Tolerance Patient tolerated treatment well    Behavior During Therapy WFL for tasks assessed/performed             Past Medical History:  Diagnosis Date   Anxiety    Depression    Dumping syndrome    Ehlers-Danlos syndrome    Postural orthostatic tachycardia syndrome    Past Surgical History:  Procedure Laterality Date   BIOPSY  11/20/2020   Procedure: BIOPSY;  Surgeon: Roberta Redbird, MD;  Location: [facility];  Service: Endoscopy;;   ESOPHAGOGASTRODUODENOSCOPY N/A 11/20/2020   Procedure: ESOPHAGOGASTRODUODENOSCOPY (EGD);  Surgeon: Roberta Redbird, MD;  Location: [facility];  Service: Endoscopy;  Laterality: N/A;   INNER EAR SURGERY     UPPER GI ENDOSCOPY     Patient Active Problem List   Diagnosis Date Noted   Generalized anxiety disorder 07/02/2020   Dry heaves 03/28/2020   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, involving the cardia 03/28/2020   Nausea and vomiting 03/28/2020   Ehlers-Danlos disease 11/22/2019   Chronic eczematous otitis externa of both ears 12/07/2018   Excessive cerumen in both ear canals 12/07/2018   POTS (postural orthostatic tachycardia syndrome) 11/04/2014   Benign essential hypertension 10/10/2014   Lymphadenopathy 02/10/2011    REFERRING DIAG: post VP shunt   THERAPY DIAG:  Muscle weakness (generalized)  Hypermobility syndrome  POTS (postural orthostatic tachycardia syndrome)  Dizziness and giddiness  Rationale for Evaluation and Treatment  Rehabilitation  PERTINENT HISTORY: Ehlers-Danlos disease   Hypertension   Intracranial hypertension   PONV (postoperative nausea and vomiting)   Past Surgical History:  Procedure Laterality Date   Esophagogastroduodenoscopy   Ir brain stenting   Lymph node biopsy 09/19/2012    PRECAUTIONS: monitor BP  SUBJECTIVE:                                                                                                                                                                                      SUBJECTIVE STATEMENT:  I am good.  I got my PICC line in.  I am on the strongest antibiotic there is  as  they don't want to get my shunt infected.     PAIN:  Are you having pain? Yes: NPRS scale: 4/10 Pain location: Headache  Pain description: throbbing , aching  Aggravating factors: constant  Relieving factors: constant   OBJECTIVE: (objective measures completed at initial evaluation unless otherwise dated)  DIAGNOSTIC FINDINGS:  none   VITALS:            SaO2 99% with HR 95             131/92   02/16/22:  125/79 98%  88 HR      PATIENT SURVEYS:  FAS :Fatigue Assesment Scale TBA   SCREENING FOR RED FLAGS: Bowel or bladder incontinence: No Spinal tumors: No Cauda equina syndrome: No Compression fracture: No Abdominal aneurysm: No   COGNITION: Overall cognitive status: Within functional limits for tasks assessed                          SENSATION: WFL   MUSCLE LENGTH: Hamstrings: NT Thomas test: NT   POSTURE: rounded shoulders and forward head   PALPATION: NT    LUMBAR ROM:  NT ON EVAL  AROM eval  Flexion    Extension    Right lateral flexion    Left lateral flexion    Right rotation    Left rotation     (Blank rows = not tested)   LOWER EXTREMITY MMT:     MMT Right eval Left eval  Hip flexion 3+ 4-  Hip extension 4- 4-  Hip abduction 3 3+  Hip adduction      Hip internal rotation      Hip external rotation      Knee flexion 4+ 4+  Knee  extension 4+ 4  Ankle dorsiflexion      Ankle plantarflexion      Ankle inversion      Ankle eversion      Core strength 3/5:  12 sec table top hold legs drop and back arches  Single leg bridge 28 sec LLE and RLE 20 sec  Trunk compensation in MMT '   SENSATION: WFL     FUNCTIONAL TESTS:  5 times sit to stand: 17 sec 30 seconds chair stand test 2 minute walk test: 443 feet Borg 5   GAIT: Distance walked: 443 Assistive device utilized: None Level of assistance: Modified independence Comments: increased postural sway, irregular path of direction, apparent core instability    LUMBAR SPECIAL TESTS:  NT     TODAY'S TREATMENT:                                                                                                                              DATE: 02/09/22    5 x sts 17 sec  30 sec: 7 reps, 103 HR    2 min walk test 443 feet 5/10 on Borg scale    [facility] Adult PT Treatment:  DATE: 02/16/22 Therapeutic Exercise: Recumbent bike 5 min L2  Bridge x 15 SLR x 15 each  90/90 hold 30 sec x 3, towel under sacrum to assist  Lat pull over 5 lbs x 10 added alt knee lift  Lat pul over 5 lbs x 16 with alt SLR  Sidelying hip circles 2 x 10  Sidelying diamond clam (feet elevated) x 15   PATIENT EDUCATION:  Education details: PT, POC  Person educated: Patient Education method: Explanation and Verbal cues Education comprehension: verbalized understanding   HOME EXERCISE PROGRAM:  Access Code: [email or web address] URL: [email or web address] Date: 02/16/2022 Prepared by: Roberta [name]  Exercises - Supine Bridge  - 1 x daily - 7 x weekly - 2 sets - 10 reps - 5 hold - Active Straight Leg Raise with Quad Set  - 1 x daily - 7 x weekly - 2 sets - 10 reps - Sidelying Hip Circles  - 1 x daily - 7 x weekly - 2 sets - 10 reps - Supine 90/90 Abdominal Bracing  - 1 x daily - 7 x weekly - 1 sets - 3-5 reps - 30 hold - Dead Bug Alternating  Arm Extension  - 1 x daily - 7 x weekly - 2 sets - 10 reps - 5 hold   Asked her to work on getting up from sitting/lying every hour, work on increasing daily activity in her home.    ASSESSMENT:   CLINICAL IMPRESSION: Roberta [name] tolerated the session well.  She shows core weakness in unsupported supine neutral spine exercises.  No increased pain.  After biking she needed to lie down.  Completed FAS (Fatigue Assessment Scale) with a score of 34/50.  Cont POC.      OBJECTIVE IMPAIRMENTS: Abnormal gait, cardiopulmonary status limiting activity, decreased activity tolerance, decreased balance, decreased coordination, decreased endurance, decreased mobility, difficulty walking, decreased strength, dizziness, postural dysfunction, and pain.    ACTIVITY LIMITATIONS: carrying, lifting, bending, standing, squatting, stairs, bathing, hygiene/grooming, and locomotion level   PARTICIPATION LIMITATIONS: meal prep, cleaning, laundry, interpersonal relationship, shopping, community activity, and occupation   PERSONAL FACTORS: Past/current experiences and 3+ comorbidities: EDS, type III, POTS, chronic UTI  are also affecting patient's functional outcome.    REHAB POTENTIAL: Excellent   CLINICAL DECISION MAKING: Unstable/unpredictable   EVALUATION COMPLEXITY: High     GOALS: Goals reviewed with patient? Yes   SHORT TERM GOALS: Target date 03/09/22     Patient will be independent with basic home exercise program for endurance and lower body strength Baseline: Goal status: INITIAL   2.  Patient will complete fatigue assessment scale and goal set  Baseline: 34/80 Goal status: INITIAL   3.  Patient will tolerate out of bed every hour for 10 minutes for 6 hours/day (60 minutes)  Baseline:  Goal status: INITIAL     LONG TERM GOALS: Target date: 04/06/22   Pt would like to be able to shop for 30-40 min with moderate fatigue  Baseline: unable, severe  Goal status: INITIAL   2.  Pt work part time  with (5 hours) with no more than minimal back pain 3 out of 5 days/week Baseline: unable to work for another 2 weeks  Goal status: INITIAL   3.  Pt will complete ADLs, dressing showering with minimal fatigue Baseline: very fatigued Goal status: INITIAL   4.  Patient will be independent in final home exercise program upon discharge Baseline:  Goal status: INITIAL   5.  FAS goal will improve to 28 /50  or less  Baseline: 34/50 Goal status: INITIAL   6.  Patient will demonstrate 5/5 lower extremity strength to optimize ambulation, transfers. Baseline:  Goal status: INITIAL             7. Patient will hold 90/90 position with lower body for 30 sec and no loss of neutral spine to demo increased core                      Baseline: 12 sec                       Goal status: INITIAL PLAN:   PT FREQUENCY: 2x/week   PT DURATION: 8 weeks   PLANNED INTERVENTIONS: Therapeutic exercises, Therapeutic activity, Neuromuscular re-education, Balance training, Gait training, Patient/Family education, Self Care, Cryotherapy, Moist heat, Manual therapy, and Re-evaluation.   PLAN FOR NEXT SESSION: check  HEP for core.  Monitor vitals, bike/UBE/NuStep Advance cardio.      Xaria Judon, PT 02/09/2022, 3:34 PM    Roberta [name], PT 02/09/22 4:40 PM Phone: [phone] Fax: [phone]    Gerritt Galentine, PT 02/16/2022, 4:30 PM

## 2022-02-18 ENCOUNTER — Encounter: Payer: Self-pay | Admitting: Physical Therapy

## 2022-02-18 ENCOUNTER — Ambulatory Visit: Payer: Managed Care, Other (non HMO) | Admitting: Physical Therapy

## 2022-02-18 DIAGNOSIS — M357 Hypermobility syndrome: Secondary | ICD-10-CM

## 2022-02-18 DIAGNOSIS — M6281 Muscle weakness (generalized): Secondary | ICD-10-CM

## 2022-02-18 DIAGNOSIS — R42 Dizziness and giddiness: Secondary | ICD-10-CM

## 2022-02-18 DIAGNOSIS — G90A Postural orthostatic tachycardia syndrome (POTS): Secondary | ICD-10-CM

## 2022-02-18 NOTE — Therapy (Signed)
OUTPATIENT PHYSICAL THERAPY TREATMENT NOTE   Patient Name: Roberta [name] MRN: [id number] DOB:April 06, 1990, 31 y.o., female Today's Date: 02/18/2022  PCP: Merri Brunette MD REFERRING PROVIDER: Merri Brunette MD   END OF SESSION:   PT End of Session - 02/18/22 0921     Visit Number 3    Number of Visits 16    Date for PT Re-Evaluation 04/06/22    Authorization Type [employer]    PT Start Time [phone]    Activity Tolerance Patient tolerated treatment well    Behavior During Therapy [facility] for tasks assessed/performed              Past Medical History:  Diagnosis Date   Anxiety    Depression    Dumping syndrome    Ehlers-Danlos syndrome    Postural orthostatic tachycardia syndrome    Past Surgical History:  Procedure Laterality Date   BIOPSY  11/20/2020   Procedure: BIOPSY;  Surgeon: Bernette Redbird, MD;  Location: [facility];  Service: Endoscopy;;   ESOPHAGOGASTRODUODENOSCOPY N/A 11/20/2020   Procedure: ESOPHAGOGASTRODUODENOSCOPY (EGD);  Surgeon: Bernette Redbird, MD;  Location: [facility];  Service: Endoscopy;  Laterality: N/A;   INNER EAR SURGERY     UPPER GI ENDOSCOPY     Patient Active Problem List   Diagnosis Date Noted   Generalized anxiety disorder 07/02/2020   Dry heaves 03/28/2020   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, involving the cardia 03/28/2020   Nausea and vomiting 03/28/2020   Ehlers-Danlos disease 11/22/2019   Chronic eczematous otitis externa of both ears 12/07/2018   Excessive cerumen in both ear canals 12/07/2018   POTS (postural orthostatic tachycardia syndrome) 11/04/2014   Benign essential hypertension 10/10/2014   Lymphadenopathy 02/10/2011    REFERRING DIAG: post VP shunt   THERAPY DIAG:  Muscle weakness (generalized)  Hypermobility syndrome  POTS (postural orthostatic tachycardia syndrome)  Dizziness and giddiness  Rationale for Evaluation and Treatment Rehabilitation  PERTINENT HISTORY: Ehlers-Danlos disease   Hypertension    Intracranial hypertension   PONV (postoperative nausea and vomiting)   Past Surgical History:  Procedure Laterality Date   Esophagogastroduodenoscopy   Ir brain stenting   Lymph node biopsy 09/19/2012    PRECAUTIONS: monitor BP  SUBJECTIVE:                                                                                                                                                                                      SUBJECTIVE STATEMENT:  "No issues today, and the dizziness is getting better."   PAIN:  Are you having pain? Yes: NPRS scale: 4/10 Pain location: Headache  Pain description: throbbing , aching  Aggravating factors: constant  Relieving  factors: constant   OBJECTIVE: (objective measures completed at initial evaluation unless otherwise dated)  DIAGNOSTIC FINDINGS:  none   VITALS:            SaO2 99% with HR 95             131/92   02/16/22:  125/79 98%  88 HR      PATIENT SURVEYS:  FAS :Fatigue Assesment Scale TBA   SCREENING FOR RED FLAGS: Bowel or bladder incontinence: No Spinal tumors: No Cauda equina syndrome: No Compression fracture: No Abdominal aneurysm: No   COGNITION: Overall cognitive status: Within functional limits for tasks assessed                          SENSATION: WFL   MUSCLE LENGTH: Hamstrings: NT Thomas test: NT   POSTURE: rounded shoulders and forward head   PALPATION: NT    LUMBAR ROM:  NT ON EVAL  AROM eval  Flexion    Extension    Right lateral flexion    Left lateral flexion    Right rotation    Left rotation     (Blank rows = not tested)   LOWER EXTREMITY MMT:     MMT Right eval Left eval  Hip flexion 3+ 4-  Hip extension 4- 4-  Hip abduction 3 3+  Hip adduction      Hip internal rotation      Hip external rotation      Knee flexion 4+ 4+  Knee extension 4+ 4  Ankle dorsiflexion      Ankle plantarflexion      Ankle inversion      Ankle eversion      Core strength 3/5:  12 sec table  top hold legs drop and back arches  Single leg bridge 28 sec LLE and RLE 20 sec  Trunk compensation in MMT '   SENSATION: WFL     FUNCTIONAL TESTS:  5 times sit to stand: 17 sec 30 seconds chair stand test 2 minute walk test: 443 feet Borg 5   GAIT: Distance walked: 443 Assistive device utilized: None Level of assistance: Modified independence Comments: increased postural sway, irregular path of direction, apparent core instability    LUMBAR SPECIAL TESTS:  NT     TODAY'S TREATMENT:                                                                                                                              DATE: 02/09/22   [facility] Adult PT Treatment:                                                DATE: 02/18/2022 Before Tx BP 178/76  HR   96 O2 97% After Tx BP  146/86  HR   87 O2 97%  Verbal cues required throughout session  Therapeutic Exercise: Recumbent bike 6 min L2 (max HR 110) Supine Bil UE press down on physioball 2 x 12 with exhalation while pressing, and inhallation with release Supine marching while maintainting sustained posterior pelvic tilt (requiring tactile cues) 2 x 20 alternating L/R Bridge 2 x 10 with glue squeeze holding 2 sec before lowering Chest press with dowel rod 2 x 12 - cues to touch her chest in the same place - modified dowel press up due to report of R PICC line aggriavation. Supine Shoulder extension bil with dowel rod 2 x 10 with GTB  Seated LAQ 2 x 10 bil 5# bil - verbal cues to avoid leaning backward during exercise Seated (in chair to avoid posterior trunk lead) overhead shoulder press bil using dowel rod 2 x 10     5 x sts 17 sec  30 sec: 7 reps, 103 HR    2 min walk test 443 feet 5/10 on Borg scale    [facility] Adult PT Treatment:                                                DATE: 02/16/22 Therapeutic Exercise: Recumbent bike 5 min L2  Bridge x 15 SLR x 15 each  90/90 hold 30 sec x 3, towel under sacrum to assist  Lat pull over 5  lbs x 10 added alt knee lift  Lat pul over 5 lbs x 16 with alt SLR  Sidelying hip circles 2 x 10  Sidelying diamond clam (feet elevated) x 15   PATIENT EDUCATION:  Education details: PT, POC  Person educated: Patient Education method: Explanation and Verbal cues Education comprehension: verbalized understanding   HOME EXERCISE PROGRAM:  Access Code: [email or web address] URL: [email or web address] Date: 02/16/2022 Prepared by: Karie MainlandJennifer Paa  Exercises - Supine Bridge  - 1 x daily - 7 x weekly - 2 sets - 10 reps - 5 hold - Active Straight Leg Raise with Quad Set  - 1 x daily - 7 x weekly - 2 sets - 10 reps - Sidelying Hip Circles  - 1 x daily - 7 x weekly - 2 sets - 10 reps - Supine 90/90 Abdominal Bracing  - 1 x daily - 7 x weekly - 1 sets - 3-5 reps - 30 hold - Dead Bug Alternating Arm Extension  - 1 x daily - 7 x weekly - 2 sets - 10 reps - 5 hold   Asked her to work on getting up from sitting/lying every hour, work on increasing daily activity in her home.    ASSESSMENT:   CLINICAL IMPRESSION: Roberta [name] arrives to session with no complaints today. Continued working gross general strengthening of core UE/LE. She does require intermittent cues to focus on breathing throughout session.  She did well with all exercises demonstrating mild sway with bridges and with quad strengthening but was able to correct position with verbal cues. She did not some intermittent bouts of dizziness that resolved with rest between sets.     OBJECTIVE IMPAIRMENTS: Abnormal gait, cardiopulmonary status limiting activity, decreased activity tolerance, decreased balance, decreased coordination, decreased endurance, decreased mobility, difficulty walking, decreased strength, dizziness, postural dysfunction, and pain.    ACTIVITY LIMITATIONS: carrying, lifting, bending, standing, squatting, stairs, bathing, hygiene/grooming, and locomotion level   PARTICIPATION LIMITATIONS: meal prep,  cleaning, laundry,  interpersonal relationship, shopping, community activity, and occupation   PERSONAL FACTORS: Past/current experiences and 3+ comorbidities: EDS, type III, POTS, chronic UTI  are also affecting patient's functional outcome.    REHAB POTENTIAL: Excellent   CLINICAL DECISION MAKING: Unstable/unpredictable   EVALUATION COMPLEXITY: High     GOALS: Goals reviewed with patient? Yes   SHORT TERM GOALS: Target date 03/09/22     Patient will be independent with basic home exercise program for endurance and lower body strength Baseline: Goal status: INITIAL   2.  Patient will complete fatigue assessment scale and goal set  Baseline: 34/80 Goal status: INITIAL   3.  Patient will tolerate out of bed every hour for 10 minutes for 6 hours/day (60 minutes)  Baseline:  Goal status: INITIAL     LONG TERM GOALS: Target date: 04/06/22   Pt would like to be able to shop for 30-40 min with moderate fatigue  Baseline: unable, severe  Goal status: INITIAL   2.  Pt work part time with (5 hours) with no more than minimal back pain 3 out of 5 days/week Baseline: unable to work for another 2 weeks  Goal status: INITIAL   3.  Pt will complete ADLs, dressing showering with minimal fatigue Baseline: very fatigued Goal status: INITIAL   4.  Patient will be independent in final home exercise program upon discharge Baseline:  Goal status: INITIAL   5.  FAS goal will improve to 28 /50 or less  Baseline: 34/50 Goal status: INITIAL   6.  Patient will demonstrate 5/5 lower extremity strength to optimize ambulation, transfers. Baseline:  Goal status: INITIAL             7. Patient will hold 90/90 position with lower body for 30 sec and no loss of neutral spine to demo increased core                      Baseline: 12 sec                       Goal status: INITIAL PLAN:   PT FREQUENCY: 2x/week   PT DURATION: 8 weeks   PLANNED INTERVENTIONS: Therapeutic exercises, Therapeutic activity,  Neuromuscular re-education, Balance training, Gait training, Patient/Family education, Self Care, Cryotherapy, Moist heat, Manual therapy, and Re-evaluation.   PLAN FOR NEXT SESSION: check  HEP for core.  Monitor vitals, bike/UBE/NuStep Advance cardio.       Dreya Buhrman PT, DPT, LAT, ATC  02/18/22  10:16 AM

## 2022-02-23 NOTE — Therapy (Unsigned)
OUTPATIENT PHYSICAL THERAPY TREATMENT NOTE   Patient Name: Roberta [name] MRN: [id number] DOB:06/20/1990, 31 y.o., female Today's Date: 02/24/2022  PCP: Deland Pretty MD REFERRING PROVIDER: Deland Pretty MD   END OF SESSION:   PT End of Session - 02/24/22 1508     Visit Number 4    Number of Visits 16    Date for PT Re-Evaluation 04/06/22    Authorization Type [employer]    PT Start Time 1500    PT Stop Time 1547    PT Time Calculation (min) 47 min    Activity Tolerance Patient tolerated treatment well    Behavior During Therapy WFL for tasks assessed/performed               Past Medical History:  Diagnosis Date   Anxiety    Depression    Dumping syndrome    Ehlers-Danlos syndrome    Postural orthostatic tachycardia syndrome    Past Surgical History:  Procedure Laterality Date   BIOPSY  11/20/2020   Procedure: BIOPSY;  Surgeon: Ronald Lobo, MD;  Location: [facility];  Service: Endoscopy;;   ESOPHAGOGASTRODUODENOSCOPY N/A 11/20/2020   Procedure: ESOPHAGOGASTRODUODENOSCOPY (EGD);  Surgeon: Ronald Lobo, MD;  Location: [facility];  Service: Endoscopy;  Laterality: N/A;   INNER EAR SURGERY     UPPER GI ENDOSCOPY     Patient Active Problem List   Diagnosis Date Noted   Generalized anxiety disorder 07/02/2020   Dry heaves 03/28/2020   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, involving the cardia 03/28/2020   Nausea and vomiting 03/28/2020   Ehlers-Danlos disease 11/22/2019   Chronic eczematous otitis externa of both ears 12/07/2018   Excessive cerumen in both ear canals 12/07/2018   POTS (postural orthostatic tachycardia syndrome) 11/04/2014   Benign essential hypertension 10/10/2014   Lymphadenopathy 02/10/2011    REFERRING DIAG: post VP shunt   THERAPY DIAG:  Muscle weakness (generalized)  Hypermobility syndrome  POTS (postural orthostatic tachycardia syndrome)  Dizziness and giddiness  Rationale for Evaluation and Treatment  Rehabilitation  PERTINENT HISTORY: Ehlers-Danlos disease   Hypertension   Intracranial hypertension   PONV (postoperative nausea and vomiting)   Past Surgical History:  Procedure Laterality Date   Esophagogastroduodenoscopy   Ir brain stenting   Lymph node biopsy 09/19/2012    PRECAUTIONS: monitor BP  SUBJECTIVE:                                                                                                                                                                                      SUBJECTIVE STATEMENT:  Pt began work today, 5 hours today.  Tired, very challenging day. Headache 3/10.  Was in Neuro ICU.  Seeing Neurosurgeon Monday to check my shunt.    PAIN:  Are you having pain? Yes: NPRS scale: 3/10 Pain location: Headache  Pain description: throbbing , aching  Aggravating factors: constant  Relieving factors: constant   OBJECTIVE: (objective measures completed at initial evaluation unless otherwise dated)  DIAGNOSTIC FINDINGS:  none   VITALS:            SaO2 99% with HR 100            123/87    PATIENT SURVEYS:  FAS :Fatigue Assesment Scale TBA   SCREENING FOR RED FLAGS: Bowel or bladder incontinence: No Spinal tumors: No Cauda equina syndrome: No Compression fracture: No Abdominal aneurysm: No   COGNITION: Overall cognitive status: Within functional limits for tasks assessed                          SENSATION: WFL   MUSCLE LENGTH: Hamstrings: NT Thomas test: NT   POSTURE: rounded shoulders and forward head   PALPATION: NT    LUMBAR ROM:  NT ON EVAL  AROM eval  Flexion    Extension    Right lateral flexion    Left lateral flexion    Right rotation    Left rotation     (Blank rows = not tested)   LOWER EXTREMITY MMT:     MMT Right eval Left eval  Hip flexion 3+ 4-  Hip extension 4- 4-  Hip abduction 3 3+  Hip adduction      Hip internal rotation      Hip external rotation      Knee flexion 4+ 4+  Knee extension 4+ 4   Ankle dorsiflexion      Ankle plantarflexion      Ankle inversion      Ankle eversion      Core strength 3/5:  12 sec table top hold legs drop and back arches  Single leg bridge 28 sec LLE and RLE 20 sec  Trunk compensation in MMT '   SENSATION: WFL     FUNCTIONAL TESTS:  5 times sit to stand: 17 sec 30 seconds chair stand test 2 minute walk test: 443 feet Borg 5   GAIT: Distance walked: 443 Assistive device utilized: None Level of assistance: Modified independence Comments: increased postural sway, irregular path of direction, apparent core instability    LUMBAR SPECIAL TESTS:  NT     TODAY'S TREATMENT:                 [facility] Adult PT Treatment:                                                DATE: 02/24/22 Therapeutic Exercise: UBE level 1 , 3 min each direction, cues for posture Pilates Reformer used for LE/core strength, postural strength, lumbopelvic disassociation and core control.  Exercises included: Footwork 2 red 1 blue  Parallel on heels, toes narrow and wide with blue TB  Heel raises x 15  Single leg press out , used BTB to stretch top leg x 10 each , 2 red 1 blue  Bridging all springs x 10 , cues to slow and control lowering phase  Sidelying Diamond clams x 15, single-leg press out 2 red  x 15 in parallel and x 15 and hip external rotation Supine  Arm work 1 red spring Arcs, circles  x 10  Triceps 1 red x 10   [facility] Adult PT Treatment:                                                DATE: 02/18/2022 Before Tx BP 178/76  HR   96 O2 97% After Tx BP  146/86    HR   87 O2 97%  Verbal cues required throughout session  Therapeutic Exercise: Recumbent bike 6 min L2 (max HR 110) Supine Bil UE press down on physioball 2 x 12 with exhalation while pressing, and inhallation with release Supine marching while maintainting sustained posterior pelvic tilt (requiring tactile cues) 2 x 20 alternating L/R Bridge 2 x 10 with glue squeeze holding 2 sec before lowering Chest  press with dowel rod 2 x 12 - cues to touch her chest in the same place - modified dowel press up due to report of R PICC line aggriavation. Supine Shoulder extension bil with dowel rod 2 x 10 with GTB  Seated LAQ 2 x 10 bil 5# bil - verbal cues to avoid leaning backward during exercise Seated (in chair to avoid posterior trunk lean) overhead shoulder press bil using dowel rod 2 x 10     5 x sts 17 sec  30 sec: 7 reps, 103 HR    2 min walk test 443 feet 5/10 on Borg scale    [facility] Adult PT Treatment:                                                DATE: 02/16/22 Therapeutic Exercise: Recumbent bike 5 min L2  Bridge x 15 SLR x 15 each  90/90 hold 30 sec x 3, towel under sacrum to assist  Lat pull over 5 lbs x 10 added alt knee lift  Lat pul over 5 lbs x 16 with alt SLR  Sidelying hip circles 2 x 10  Sidelying diamond clam (feet elevated) x 15   PATIENT EDUCATION:  Education details: PT, POC  Person educated: Patient Education method: Explanation and Verbal cues Education comprehension: verbalized understanding   HOME EXERCISE PROGRAM:  Access Code: [email or web address] URL: [email or web address] Date: 02/16/2022 Prepared by: [name]  Exercises - Supine Bridge  - 1 x daily - 7 x weekly - 2 sets - 10 reps - 5 hold - Active Straight Leg Raise with Quad Set  - 1 x daily - 7 x weekly - 2 sets - 10 reps - Sidelying Hip Circles  - 1 x daily - 7 x weekly - 2 sets - 10 reps - Supine 90/90 Abdominal Bracing  - 1 x daily - 7 x weekly - 1 sets - 3-5 reps - 30 hold - Dead Bug Alternating Arm Extension  - 1 x daily - 7 x weekly - 2 sets - 10 reps - 5 hold   Asked her to work on getting up from sitting/lying every hour, work on increasing daily activity in her home.    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 Symptoms mainly were fatigue, min headache.  Chose Reformer  exercises to address core stability and LE strength.  She needs cue to maintain core control, breathe and focus on alignment during footowork  Modified spring tension in UE exercises.      OBJECTIVE IMPAIRMENTS: Abnormal gait, cardiopulmonary status limiting activity, decreased activity tolerance, decreased balance, decreased coordination, decreased endurance, decreased mobility, difficulty walking, decreased strength, dizziness, postural dysfunction, and pain.    ACTIVITY LIMITATIONS: carrying, lifting, bending, standing, squatting, stairs, bathing, hygiene/grooming, and locomotion level   PARTICIPATION LIMITATIONS: meal prep, cleaning, laundry, interpersonal relationship, shopping, community activity, and occupation   PERSONAL FACTORS: Past/current experiences and 3+ comorbidities: EDS, type III, POTS, chronic UTI  are also affecting patient's functional outcome.    REHAB POTENTIAL: Excellent   CLINICAL DECISION MAKING: Unstable/unpredictable   EVALUATION COMPLEXITY: High     GOALS: Goals reviewed with patient? Yes   SHORT TERM GOALS: Target date 03/09/22     Patient will be independent with basic home exercise program for endurance and lower body strength Baseline: Goal status: MET   2.  Patient will complete fatigue assessment scale and goal set  Baseline: 34/80 Goal status: MET   3.  Patient will tolerate out of bed every hour for 10 minutes for 6 hours/day (60 minutes)  Baseline: has started work  Goal status: ongoing      LONG TERM GOALS: Target date: 04/06/22   Pt would like to be able to shop for 30-40 min with moderate fatigue  Baseline: unable, severe  Goal status: INITIAL    2.  Pt work part time with (5 hours) with no more than minimal back pain 3 out of 5 days/week Baseline: unable to work for another 2 weeks  Goal status: INITIAL   3.  Pt will complete ADLs, dressing showering with minimal fatigue Baseline: very fatigued Goal status: INITIAL   4.   Patient will be independent in final home exercise program upon discharge Baseline:  Goal status: INITIAL   5.  FAS goal will improve to 28 /50 or less  Baseline: 34/50 Goal status: INITIAL   6.  Patient will demonstrate 5/5 lower extremity strength to optimize ambulation, transfers. Baseline:  Goal status: INITIAL             7. Patient will hold 90/90 position with lower body for 30 sec and no loss of neutral spine to demo increased core                      Baseline: 12 sec                       Goal status: INITIAL PLAN:   PT FREQUENCY: 2x/week   PT DURATION: 8 weeks   PLANNED INTERVENTIONS: Therapeutic exercises, Therapeutic activity, Neuromuscular re-education, Balance training, Gait training, Patient/Family education, Self Care, Cryotherapy, Moist heat, Manual therapy, and Re-evaluation.   PLAN FOR NEXT SESSION: check  HEP for core.  Monitor vitals, bike/UBE/NuStep Advance cardio as tolerated.    [name], PT 02/24/22 3:56 PM Phone: [phone] Fax: [phone]

## 2022-02-24 ENCOUNTER — Encounter: Payer: Self-pay | Admitting: Physical Therapy

## 2022-02-24 ENCOUNTER — Ambulatory Visit: Payer: Managed Care, Other (non HMO) | Attending: Internal Medicine | Admitting: Physical Therapy

## 2022-02-24 DIAGNOSIS — M6281 Muscle weakness (generalized): Secondary | ICD-10-CM

## 2022-02-24 DIAGNOSIS — M357 Hypermobility syndrome: Secondary | ICD-10-CM

## 2022-02-24 DIAGNOSIS — G90A Postural orthostatic tachycardia syndrome (POTS): Secondary | ICD-10-CM

## 2022-02-24 DIAGNOSIS — R42 Dizziness and giddiness: Secondary | ICD-10-CM | POA: Diagnosis present

## 2022-02-25 ENCOUNTER — Encounter: Payer: Self-pay | Admitting: Physical Therapy

## 2022-02-25 ENCOUNTER — Ambulatory Visit: Payer: Managed Care, Other (non HMO) | Admitting: Physical Therapy

## 2022-02-25 DIAGNOSIS — M6281 Muscle weakness (generalized): Secondary | ICD-10-CM

## 2022-02-25 DIAGNOSIS — G90A Postural orthostatic tachycardia syndrome (POTS): Secondary | ICD-10-CM

## 2022-02-25 DIAGNOSIS — M357 Hypermobility syndrome: Secondary | ICD-10-CM

## 2022-02-25 NOTE — Therapy (Signed)
OUTPATIENT PHYSICAL THERAPY TREATMENT NOTE   Patient Name: Roberta [name] MRN: [id number] DOB:[birth year], 31 y.o., female Today's Date: 02/25/2022  PCP: Deland Pretty MD REFERRING PROVIDER: Deland Pretty MD   END OF SESSION:   PT End of Session - 02/25/22 1324     Visit Number 5    Number of Visits 16    Date for PT Re-Evaluation 04/06/22    Authorization Type [employer]    PT Start Time 1324    PT Stop Time 1412    PT Time Calculation (min) 48 min    Activity Tolerance Patient tolerated treatment well    Behavior During Therapy WFL for tasks assessed/performed                Past Medical History:  Diagnosis Date   Anxiety    Depression    Dumping syndrome    Ehlers-Danlos syndrome    Postural orthostatic tachycardia syndrome    Past Surgical History:  Procedure Laterality Date   BIOPSY  11/20/2020   Procedure: BIOPSY;  Surgeon: Ronald Lobo, MD;  Location: [facility];  Service: Endoscopy;;   ESOPHAGOGASTRODUODENOSCOPY N/A 11/20/2020   Procedure: ESOPHAGOGASTRODUODENOSCOPY (EGD);  Surgeon: Ronald Lobo, MD;  Location: [facility];  Service: Endoscopy;  Laterality: N/A;   INNER EAR SURGERY     UPPER GI ENDOSCOPY     Patient Active Problem List   Diagnosis Date Noted   Generalized anxiety disorder 07/02/2020   Dry heaves 03/28/2020   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, involving the cardia 03/28/2020   Nausea and vomiting 03/28/2020   Ehlers-Danlos disease 11/22/2019   Chronic eczematous otitis externa of both ears 12/07/2018   Excessive cerumen in both ear canals 12/07/2018   POTS (postural orthostatic tachycardia syndrome) 11/04/2014   Benign essential hypertension 10/10/2014   Lymphadenopathy 02/10/2011    REFERRING DIAG: post VP shunt   THERAPY DIAG:  Muscle weakness (generalized)  Hypermobility syndrome  POTS (postural orthostatic tachycardia syndrome)  Rationale for Evaluation and Treatment Rehabilitation  PERTINENT HISTORY:  Ehlers-Danlos disease   Hypertension   Intracranial hypertension   PONV (postoperative nausea and vomiting)   Past Surgical History:  Procedure Laterality Date   Esophagogastroduodenoscopy   Ir brain stenting   Lymph node biopsy 09/19/2012    PRECAUTIONS: monitor BP  SUBJECTIVE:                                                                                                                                                                                      SUBJECTIVE STATEMENT:  " I am doing pretty good today, I am having some soreness where my incisions are in my R side and in  my head."   PAIN:  Are you having pain? Yes: NPRS scale: 4/10 Pain location:  incision R lower quadrant, and  Pain description: throbbing , aching  Aggravating factors: constant  Relieving factors: constant   OBJECTIVE: (objective measures completed at initial evaluation unless otherwise dated)  DIAGNOSTIC FINDINGS:  none   VITALS:            SaO2 99% with HR 100            123/87    PATIENT SURVEYS:  FAS :Fatigue Assesment Scale TBA   SCREENING FOR RED FLAGS: Bowel or bladder incontinence: No Spinal tumors: No Cauda equina syndrome: No Compression fracture: No Abdominal aneurysm: No   COGNITION: Overall cognitive status: Within functional limits for tasks assessed                          SENSATION: WFL   MUSCLE LENGTH: Hamstrings: NT Thomas test: NT   POSTURE: rounded shoulders and forward head   PALPATION: NT    LUMBAR ROM:  NT ON EVAL  AROM eval  Flexion    Extension    Right lateral flexion    Left lateral flexion    Right rotation    Left rotation     (Blank rows = not tested)   LOWER EXTREMITY MMT:     MMT Right eval Left eval  Hip flexion 3+ 4-  Hip extension 4- 4-  Hip abduction 3 3+  Hip adduction      Hip internal rotation      Hip external rotation      Knee flexion 4+ 4+  Knee extension 4+ 4  Ankle dorsiflexion      Ankle plantarflexion       Ankle inversion      Ankle eversion      Core strength 3/5:  12 sec table top hold legs drop and back arches  Single leg bridge 28 sec LLE and RLE 20 sec  Trunk compensation in MMT '   SENSATION: WFL     FUNCTIONAL TESTS:  5 times sit to stand: 17 sec 30 seconds chair stand test 2 minute walk test: 443 feet Borg 5   GAIT: Distance walked: 443 Assistive device utilized: None Level of assistance: Modified independence Comments: increased postural sway, irregular path of direction, apparent core instability    LUMBAR SPECIAL TESTS:  NT     TODAY'S TREATMENT:               [facility] Adult PT Treatment:                                                DATE: 02/25/2022 Before Tx 123/87 HR 89  After Tx  118/80 HR 90  Therapeutic Exercise: Recumbnet bike L3 x 3 min  UBE L1 x 3 min forward only Standing Rows 2 x 12 with GTB Standing shoulder extension 2 x 12 with GTB Supine bil press into red physioball 1 x 15 holding ea 3 sec Supine alternating marching 2 x 12 Dead bug 4 x 20 sec - verbal/ tactile cues to keep back flat throughout exercise.  Bridge 1 x 12 - cues to avoid sway, progressed to SL bridge 1 x 10 bil Standing ball rolling with bil UE flexion, then rolling the ball to the left  and to the Right while keeping neutral position and keeping knees in constant state of mid flexion     [facility] Adult PT Treatment:                                                DATE: 02/24/22 Therapeutic Exercise: UBE level 1 , 3 min each direction, cues for posture Pilates Reformer used for LE/core strength, postural strength, lumbopelvic disassociation and core control.  Exercises included: Footwork 2 red 1 blue  Parallel on heels, toes narrow and wide with blue TB  Heel raises x 15  Single leg press out , used BTB to stretch top leg x 10 each , 2 red 1 blue  Bridging all springs x 10 , cues to slow and control lowering phase  Sidelying Diamond clams x 15, single-leg press out 2 red  x 15 in  parallel and x 15 and hip external rotation Supine Arm work 1 red spring Arcs, circles  x 10  Triceps 1 red x 10   [facility] Adult PT Treatment:                                                DATE: 02/18/2022 Before Tx BP 178/76  HR   96 O2 97% After Tx BP  146/86    HR   87 O2 97%  Verbal cues required throughout session  Therapeutic Exercise: Recumbent bike 6 min L2 (max HR 110) Supine Bil UE press down on physioball 2 x 12 with exhalation while pressing, and inhallation with release Supine marching while maintainting sustained posterior pelvic tilt (requiring tactile cues) 2 x 20 alternating L/R Bridge 2 x 10 with glue squeeze holding 2 sec before lowering Chest press with dowel rod 2 x 12 - cues to touch her chest in the same place - modified dowel press up due to report of R PICC line aggriavation. Supine Shoulder extension bil with dowel rod 2 x 10 with GTB  Seated LAQ 2 x 10 bil 5# bil - verbal cues to avoid leaning backward during exercise Seated (in chair to avoid posterior trunk lean) overhead shoulder press bil using dowel rod 2 x 10      PATIENT EDUCATION:  Education details: PT, POC  Person educated: Patient Education method: Explanation and Verbal cues Education comprehension: verbalized understanding   HOME EXERCISE PROGRAM:  Access Code: [email or web address] URL: [email or web address] Date: 02/16/2022 Prepared by: [name]  Exercises - Supine Bridge  - 1 x daily - 7 x weekly - 2 sets - 10 reps - 5 hold - Active Straight Leg Raise with Quad Set  - 1 x daily - 7 x weekly - 2 sets - 10 reps - Sidelying Hip Circles  - 1 x daily - 7 x weekly - 2 sets - 10 reps - Supine 90/90 Abdominal Bracing  - 1 x daily - 7 x weekly - 1 sets - 3-5 reps - 30 hold - Dead Bug Alternating Arm Extension  - 1 x daily - 7 x weekly - 2 sets - 10 reps - 5 hold   Asked her to work on getting up from sitting/lying every hour, work on increasing daily activity in her home.  ASSESSMENT:   CLINICAL IMPRESSION: Pt arrives to session reporting some soreness located along her incision sites, but otherwise notes she is doing well. She has returned to work part time noting sh eis doing okay. Continued working on gross strengthening in standing and supine positioning. She does continue to require verbal cues for breathing and to avoid compensation strategies.    OBJECTIVE IMPAIRMENTS: Abnormal gait, cardiopulmonary status limiting activity, decreased activity tolerance, decreased balance, decreased coordination, decreased endurance, decreased mobility, difficulty walking, decreased strength, dizziness, postural dysfunction, and pain.    ACTIVITY LIMITATIONS: carrying, lifting, bending, standing, squatting, stairs, bathing, hygiene/grooming, and locomotion level   PARTICIPATION LIMITATIONS: meal prep, cleaning, laundry, interpersonal relationship, shopping, community activity, and occupation   PERSONAL FACTORS: Past/current experiences and 3+ comorbidities: EDS, type III, POTS, chronic UTI  are also affecting patient's functional outcome.    REHAB POTENTIAL: Excellent   CLINICAL DECISION MAKING: Unstable/unpredictable   EVALUATION COMPLEXITY: High     GOALS: Goals reviewed with patient? Yes   SHORT TERM GOALS: Target date 03/09/22     Patient will be independent with basic home exercise program for endurance and lower body strength Baseline: Goal status: MET   2.  Patient will complete fatigue assessment scale and goal set  Baseline: 34/80 Goal status: MET   3.  Patient will tolerate out of bed every hour for 10 minutes for 6 hours/day (60 minutes)  Baseline: has started work  Goal status: ongoing      LONG TERM GOALS: Target date: 04/06/22   Pt would like to be able to shop for 30-40 min with moderate fatigue  Baseline: unable, severe  Goal status: INITIAL    2.  Pt work part time with (5 hours) with no more than minimal back pain 3 out of 5  days/week Baseline: unable to work for another 2 weeks  Goal status: INITIAL   3.  Pt will complete ADLs, dressing showering with minimal fatigue Baseline: very fatigued Goal status: INITIAL   4.  Patient will be independent in final home exercise program upon discharge Baseline:  Goal status: INITIAL   5.  FAS goal will improve to 28 /50 or less  Baseline: 34/50 Goal status: INITIAL   6.  Patient will demonstrate 5/5 lower extremity strength to optimize ambulation, transfers. Baseline:  Goal status: INITIAL             7. Patient will hold 90/90 position with lower body for 30 sec and no loss of neutral spine to demo increased core                      Baseline: 12 sec                       Goal status: INITIAL PLAN:   PT FREQUENCY: 2x/week   PT DURATION: 8 weeks   PLANNED INTERVENTIONS: Therapeutic exercises, Therapeutic activity, Neuromuscular re-education, Balance training, Gait training, Patient/Family education, Self Care, Cryotherapy, Moist heat, Manual therapy, and Re-evaluation.   PLAN FOR NEXT SESSION: check  HEP for core.  Monitor vitals, bike/UBE/NuStep Advance cardio as tolerated.    Samanta Gal PT, DPT, LAT, ATC  02/25/22  2:18 PM

## 2022-03-01 ENCOUNTER — Encounter: Payer: Managed Care, Other (non HMO) | Admitting: Physical Therapy

## 2022-03-02 ENCOUNTER — Encounter: Payer: Managed Care, Other (non HMO) | Admitting: Physical Therapy

## 2022-03-03 ENCOUNTER — Encounter: Payer: Managed Care, Other (non HMO) | Admitting: Physical Therapy

## 2022-03-04 DIAGNOSIS — N159 Renal tubulo-interstitial disease, unspecified: Secondary | ICD-10-CM | POA: Insufficient documentation

## 2022-03-04 DIAGNOSIS — R1010 Upper abdominal pain, unspecified: Secondary | ICD-10-CM | POA: Insufficient documentation

## 2022-03-09 ENCOUNTER — Encounter: Payer: Managed Care, Other (non HMO) | Admitting: Physical Therapy

## 2022-03-10 ENCOUNTER — Encounter: Payer: Managed Care, Other (non HMO) | Admitting: Physical Therapy

## 2022-03-17 NOTE — Therapy (Unsigned)
OUTPATIENT PHYSICAL THERAPY TREATMENT NOTE   Patient Name: Roberta [name] MRN: [id number] DOB:1990-04-23, 31 y.o., female Today's Date: 03/18/2022  PCP: Deland Pretty MD REFERRING PROVIDER: Deland Pretty MD   END OF SESSION:   PT End of Session - 03/18/22 1114     Visit Number 6    Number of Visits 16    Date for PT Re-Evaluation 04/06/22    Authorization Type [employer]    PT Start Time 1104    PT Stop Time 1145    PT Time Calculation (min) 41 min    Activity Tolerance Patient tolerated treatment well    Behavior During Therapy WFL for tasks assessed/performed                 Past Medical History:  Diagnosis Date   Anxiety    Depression    Dumping syndrome    Ehlers-Danlos syndrome    Postural orthostatic tachycardia syndrome    Past Surgical History:  Procedure Laterality Date   BIOPSY  11/20/2020   Procedure: BIOPSY;  Surgeon: Ronald Lobo, MD;  Location: [facility];  Service: Endoscopy;;   ESOPHAGOGASTRODUODENOSCOPY N/A 11/20/2020   Procedure: ESOPHAGOGASTRODUODENOSCOPY (EGD);  Surgeon: Ronald Lobo, MD;  Location: [facility];  Service: Endoscopy;  Laterality: N/A;   INNER EAR SURGERY     UPPER GI ENDOSCOPY     Patient Active Problem List   Diagnosis Date Noted   Generalized anxiety disorder 07/02/2020   Dry heaves 03/28/2020   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, involving the cardia 03/28/2020   Nausea and vomiting 03/28/2020   Ehlers-Danlos disease 11/22/2019   Chronic eczematous otitis externa of both ears 12/07/2018   Excessive cerumen in both ear canals 12/07/2018   POTS (postural orthostatic tachycardia syndrome) 11/04/2014   Benign essential hypertension 10/10/2014   Lymphadenopathy 02/10/2011    REFERRING DIAG: post VP shunt   THERAPY DIAG:  Muscle weakness (generalized)  Hypermobility syndrome  POTS (postural orthostatic tachycardia syndrome)  Dizziness and giddiness  Rationale for Evaluation and Treatment  Rehabilitation  PERTINENT HISTORY: Ehlers-Danlos disease   Hypertension   Intracranial hypertension   PONV (postoperative nausea and vomiting)   Past Surgical History:  Procedure Laterality Date   Esophagogastroduodenoscopy   Ir brain stenting   Lymph node biopsy 09/19/2012    PRECAUTIONS: monitor BP  SUBJECTIVE:                                                                                                                                                                                      SUBJECTIVE STATEMENT:  Saw neurosurgeon and they changed to pressure down in my shunt to get the pressure off the  optic.  Tomorrow pre-op to fix the tubing.  It dislodged and fell into my pelvis. So he will go back in to fix it.   She is not sure if she will be able to go back to work.     PAIN:  Are you having pain? Yes: NPRS scale: 4/10 Pain location:  incision R lower quadrant Pain description: throbbing , aching  Aggravating factors: constant  Relieving factors: constant   OBJECTIVE: (objective measures completed at initial evaluation unless otherwise dated)  DIAGNOSTIC FINDINGS:  none   VITALS:            SaO2 99% with HR 100            123/87    PATIENT SURVEYS:  FAS :Fatigue Assesment Scale TBA   SCREENING FOR RED FLAGS: Bowel or bladder incontinence: No Spinal tumors: No Cauda equina syndrome: No Compression fracture: No Abdominal aneurysm: No   COGNITION: Overall cognitive status: Within functional limits for tasks assessed                          SENSATION: WFL   MUSCLE LENGTH: Hamstrings: NT Thomas test: NT   POSTURE: rounded shoulders and forward head   PALPATION: NT    LUMBAR ROM:  NT ON EVAL  AROM eval  Flexion    Extension    Right lateral flexion    Left lateral flexion    Right rotation    Left rotation     (Blank rows = not tested)   LOWER EXTREMITY MMT:     MMT Right eval Left eval  Hip flexion 3+ 4-  Hip extension 4- 4-  Hip  abduction 3 3+  Hip adduction      Hip internal rotation      Hip external rotation      Knee flexion 4+ 4+  Knee extension 4+ 4  Ankle dorsiflexion      Ankle plantarflexion      Ankle inversion      Ankle eversion      Core strength 3/5:  12 sec table top hold legs drop and back arches  Single leg bridge 28 sec LLE and RLE 20 sec  Trunk compensation in MMT '   SENSATION: WFL     FUNCTIONAL TESTS:  5 times sit to stand: 17 sec 30 seconds chair stand test 2 minute walk test: 443 feet Borg 5   GAIT: Distance walked: 443 Assistive device utilized: None Level of assistance: Modified independence Comments: increased postural sway, irregular path of direction, apparent core instability    LUMBAR SPECIAL TESTS:  NT     TODAY'S TREATMENT:              [facility] Adult PT Treatment:                                                DATE: 03/18/22  Before Tx 118/83 HR 86 After Tx  129/70  HR 98    Therapeutic Exercise: NuStep L6 for 8 min UE and LE  Standing core, endurance: High knee march (2 x 4 lbs DB rack position) Overhead press (2 x 4 lbs DB) x 10  Forward raise, lateral raise x 10 each , leaning into wall for support  Bilateral bicep curl 4 lbs x 20  Quadruped  alternating arm lift , added legs x 8 reps, cues for stability  Supine core stabilization feet on ball for challenge, unilateral 4 lbs shoulder horizontal abduction 90/90 hold no pain, better form here 90/90 knee extension with ball  Bridge with ball x 10  Supine double arm pull (extension ) blue band with PT holding  Sideplank 30 sec each side  Front low plank knees on mat 2 x 20 sec  [facility] Adult PT Treatment:                                                DATE: 02/25/2022 Before Tx 123/87 HR 89  After Tx  118/80 HR 90  Therapeutic Exercise: Recumbnet bike L3 x 3 min  UBE L1 x 3 min forward only Standing Rows 2 x 12 with GTB Standing shoulder extension 2 x 12 with GTB Supine bil press into red physioball 1  x 15 holding ea 3 sec Supine alternating marching 2 x 12 Dead bug 4 x 20 sec - verbal/ tactile cues to keep back flat throughout exercise.  Bridge 1 x 12 - cues to avoid sway, progressed to SL bridge 1 x 10 bil Standing ball rolling with bil UE flexion, then rolling the ball to the left and to the Right while keeping neutral position and keeping knees in constant state of mid flexion     [facility] Adult PT Treatment:                                                DATE: 02/24/22 Therapeutic Exercise: UBE level 1 , 3 min each direction, cues for posture Pilates Reformer used for LE/core strength, postural strength, lumbopelvic disassociation and core control.  Exercises included: Footwork 2 red 1 blue  Parallel on heels, toes narrow and wide with blue TB  Heel raises x 15  Single leg press out , used BTB to stretch top leg x 10 each , 2 red 1 blue  Bridging all springs x 10 , cues to slow and control lowering phase  Sidelying Diamond clams x 15, single-leg press out 2 red  x 15 in parallel and x 15 and hip external rotation Supine Arm work 1 red spring Arcs, circles  x 10  Triceps 1 red x 10      PATIENT EDUCATION:  Education details: PT, POC  Person educated: Patient Education method: Explanation and Verbal cues Education comprehension: verbalized understanding   HOME EXERCISE PROGRAM:  Access Code: [email or web address] URL: [email or web address] Date: 02/16/2022 Prepared by: [name]  Exercises - Supine Bridge  - 1 x daily - 7 x weekly - 2 sets - 10 reps - 5 hold - Active Straight Leg Raise with Quad Set  - 1 x daily - 7 x weekly - 2 sets - 10 reps - Sidelying Hip Circles  - 1 x daily - 7 x weekly - 2 sets - 10 reps - Supine 90/90 Abdominal Bracing  - 1 x daily - 7 x weekly - 1 sets - 3-5 reps - 30 hold - Dead Bug Alternating Arm Extension  - 1 x daily - 7 x weekly - 2 sets - 10 reps - 5 hold   Asked her to work on  getting up from sitting/lying every hour, work on increasing  daily activity in her home.    ASSESSMENT:   CLINICAL IMPRESSION: Patient will eventually have to have another procedure to fix her shunt.  She cont to have lower quadrant pain (min to mod) during session but only increased with front plank.  She needs cues to engage her abdominals to stabilize throughout session. HR and BP stable.       OBJECTIVE IMPAIRMENTS: Abnormal gait, cardiopulmonary status limiting activity, decreased activity tolerance, decreased balance, decreased coordination, decreased endurance, decreased mobility, difficulty walking, decreased strength, dizziness, postural dysfunction, and pain.    ACTIVITY LIMITATIONS: carrying, lifting, bending, standing, squatting, stairs, bathing, hygiene/grooming, and locomotion level   PARTICIPATION LIMITATIONS: meal prep, cleaning, laundry, interpersonal relationship, shopping, community activity, and occupation   PERSONAL FACTORS: Past/current experiences and 3+ comorbidities: EDS, type III, POTS, chronic UTI  are also affecting patient's functional outcome.    REHAB POTENTIAL: Excellent   CLINICAL DECISION MAKING: Unstable/unpredictable   EVALUATION COMPLEXITY: High     GOALS: Goals reviewed with patient? Yes   SHORT TERM GOALS: Target date 03/09/22     Patient will be independent with basic home exercise program for endurance and lower body strength Baseline: Goal status: MET   2.  Patient will complete fatigue assessment scale and goal set  Baseline: 34/80 Goal status: MET   3.  Patient will tolerate out of bed every hour for 10 minutes for 6 hours/day (60 minutes)  Baseline: has started work  Goal status: ongoing      LONG TERM GOALS: Target date: 04/06/22   Pt would like to be able to shop for 30-40 min with moderate fatigue  Baseline: unable, severe  Goal status: INITIAL    2.  Pt work part time with (5 hours) with no more than minimal back pain 3 out of 5 days/week Baseline: unable to work , now working  10 hour days x 2 and fatigue severe at 6 hours.  Back pain minimal  Goal status: ongoing     3.  Pt will complete ADLs, dressing showering with minimal fatigue Baseline: very fatigued Goal status: INITIAL   4.  Patient will be independent in final home exercise program upon discharge Baseline:  Goal status: INITIAL   5.  FAS goal will improve to 28 /50 or less  Baseline: 34/50 Goal status: INITIAL   6.  Patient will demonstrate 5/5 lower extremity strength to optimize ambulation, transfers. Baseline:  Goal status: INITIAL             7. Patient will hold 90/90 position with lower body for 30 sec and no loss of neutral spine to demo increased core                      Baseline: 12 sec                       Goal status: INITIAL PLAN:   PT FREQUENCY: 2x/week   PT DURATION: 8 weeks   PLANNED INTERVENTIONS: Therapeutic exercises, Therapeutic activity, Neuromuscular re-education, Balance training, Gait training, Patient/Family education, Self Care, Cryotherapy, Moist heat, Manual therapy, and Re-evaluation.   PLAN FOR NEXT SESSION: check  HEP for core.  Monitor vitals, bike/UBE/NuStep Advance cardio as tolerated.    [name], PT 03/18/22 12:56 PM Phone: [phone] Fax: [phone]

## 2022-03-18 ENCOUNTER — Ambulatory Visit: Payer: Managed Care, Other (non HMO) | Admitting: Physical Therapy

## 2022-03-18 DIAGNOSIS — R42 Dizziness and giddiness: Secondary | ICD-10-CM

## 2022-03-18 DIAGNOSIS — M357 Hypermobility syndrome: Secondary | ICD-10-CM

## 2022-03-18 DIAGNOSIS — M6281 Muscle weakness (generalized): Secondary | ICD-10-CM | POA: Diagnosis not present

## 2022-03-18 DIAGNOSIS — G90A Postural orthostatic tachycardia syndrome (POTS): Secondary | ICD-10-CM

## 2022-03-19 DIAGNOSIS — R102 Pelvic and perineal pain: Secondary | ICD-10-CM | POA: Insufficient documentation

## 2022-03-22 HISTORY — PX: OTHER SURGICAL HISTORY: SHX169

## 2022-03-24 NOTE — Therapy (Unsigned)
OUTPATIENT PHYSICAL THERAPY TREATMENT NOTE   Patient Name: Roberta [name] MRN: [id number] DOB:Jul 17, 1990, 32 y.o., female Today's Date: 03/25/2022  PCP: Deland Pretty MD REFERRING PROVIDER: Deland Pretty MD   END OF SESSION:   PT End of Session - 03/25/22 0753     Visit Number 7    Number of Visits 16    Date for PT Re-Evaluation 04/06/22    Authorization Type [employer]    PT Start Time 0800    PT Stop Time 0845    PT Time Calculation (min) 45 min    Activity Tolerance Patient tolerated treatment well    Behavior During Therapy [facility] for tasks assessed/performed                  Past Medical History:  Diagnosis Date   Anxiety    Depression    Dumping syndrome    Ehlers-Danlos syndrome    Postural orthostatic tachycardia syndrome    Past Surgical History:  Procedure Laterality Date   BIOPSY  11/20/2020   Procedure: BIOPSY;  Surgeon: Ronald Lobo, MD;  Location: [facility];  Service: Endoscopy;;   ESOPHAGOGASTRODUODENOSCOPY N/A 11/20/2020   Procedure: ESOPHAGOGASTRODUODENOSCOPY (EGD);  Surgeon: Ronald Lobo, MD;  Location: [facility];  Service: Endoscopy;  Laterality: N/A;   INNER EAR SURGERY     UPPER GI ENDOSCOPY     Patient Active Problem List   Diagnosis Date Noted   Generalized anxiety disorder 07/02/2020   Dry heaves 03/28/2020   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, involving the cardia 03/28/2020   Nausea and vomiting 03/28/2020   Ehlers-Danlos disease 11/22/2019   Chronic eczematous otitis externa of both ears 12/07/2018   Excessive cerumen in both ear canals 12/07/2018   POTS (postural orthostatic tachycardia syndrome) 11/04/2014   Benign essential hypertension 10/10/2014   Lymphadenopathy 02/10/2011    REFERRING DIAG: post VP shunt   THERAPY DIAG:  Muscle weakness (generalized)  Hypermobility syndrome  POTS (postural orthostatic tachycardia syndrome)  Dizziness and giddiness  Rationale for Evaluation and Treatment  Rehabilitation  PERTINENT HISTORY: Ehlers-Danlos disease   Hypertension   Intracranial hypertension   PONV (postoperative nausea and vomiting)   Past Surgical History:  Procedure Laterality Date   Esophagogastroduodenoscopy   Ir brain stenting   Lymph node biopsy 09/19/2012    PRECAUTIONS: monitor BP  SUBJECTIVE:                                                                                                                                                                                      SUBJECTIVE STATEMENT:  They scheduled my surgery (04/04/22). I have some lower belly pain but no back pain.  PAIN:  Are you having pain? Yes: NPRS scale: 4/10 Pain location:  incision R lower quadrant Pain description: throbbing , aching  Aggravating factors: constant  Relieving factors: constant   OBJECTIVE: (objective measures completed at initial evaluation unless otherwise dated)  DIAGNOSTIC FINDINGS:  none   VITALS:            SaO2 99% with HR 100            123/87    PATIENT SURVEYS:  FAS :Fatigue Assesment Scale TBA   SCREENING FOR RED FLAGS: Bowel or bladder incontinence: No Spinal tumors: No Cauda equina syndrome: No Compression fracture: No Abdominal aneurysm: No   COGNITION: Overall cognitive status: Within functional limits for tasks assessed                          SENSATION: WFL   MUSCLE LENGTH: Hamstrings: NT Thomas test: NT   POSTURE: rounded shoulders and forward head   PALPATION: NT    LUMBAR ROM:  NT ON EVAL  AROM eval  Flexion    Extension    Right lateral flexion    Left lateral flexion    Right rotation    Left rotation     (Blank rows = not tested)   LOWER EXTREMITY MMT:     MMT Right eval Left eval  Hip flexion 3+ 4-  Hip extension 4- 4-  Hip abduction 3 3+  Hip adduction      Hip internal rotation      Hip external rotation      Knee flexion 4+ 4+  Knee extension 4+ 4  Ankle dorsiflexion      Ankle plantarflexion       Ankle inversion      Ankle eversion      Core strength 3/5:  12 sec table top hold legs drop and back arches  Single leg bridge 28 sec LLE and RLE 20 sec  Trunk compensation in MMT '   SENSATION: WFL     FUNCTIONAL TESTS:  5 times sit to stand: 17 sec 30 seconds chair stand test 2 minute walk test: 443 feet Borg 5   GAIT: Distance walked: 443 Assistive device utilized: None Level of assistance: Modified independence Comments: increased postural sway, irregular path of direction, apparent core instability    LUMBAR SPECIAL TESTS:  NT     TODAY'S TREATMENT:               [facility] Adult PT Treatment:                                                DATE: 03/24/22 Therapeutic Exercise: Rexumbent bike level 2 for 8 min  Standing core, balance: BOSU Static balance no UEs x 1 min Mini squat x 2 x 10 with UE support Sidefacing step up with BOSU then hip abduction/SLS x 15  Static balance with flat side x 1 min no UEs  Mini squat flat side up x 20 Dead lift 15 lbs x 15  Stagger dead lift x 15 lbs x 10 each side  Wall sit with 15 lbs x 30 sec x 3 added heel/toe variations , cues for neutral spine , added chest press 6 lbs for last set  Ball squeeze bridges x 15   [facility] Adult PT Treatment:  DATE: 03/18/22  Before Tx 118/83 HR 86 After Tx  129/70  HR 98    Therapeutic Exercise: NuStep L6 for 8 min UE and LE  Standing core, endurance: High knee march (2 x 4 lbs DB rack position) Overhead press (2 x 4 lbs DB) x 10  Forward raise, lateral raise x 10 each , leaning into wall for support  Bilateral bicep curl 4 lbs x 20  Quadruped alternating arm lift , added legs x 8 reps, cues for stability  Supine core stabilization feet on ball for challenge, unilateral 4 lbs shoulder horizontal abduction 90/90 hold no pain, better form here 90/90 knee extension with ball  Bridge with ball x 10  Supine double arm pull (extension ) blue band  with PT holding  Sideplank 30 sec each side  Front low plank knees on mat 2 x 20 sec     PATIENT EDUCATION:  Education details: PT, POC  Person educated: Patient Education method: Merchandiser, retail cues Education comprehension: verbalized understanding   HOME EXERCISE PROGRAM:  Access Code: [email or web address] URL: [email or web address] Date: 02/16/2022 Prepared by: [name]  Exercises - Supine Bridge  - 1 x daily - 7 x weekly - 2 sets - 10 reps - 5 hold - Active Straight Leg Raise with Quad Set  - 1 x daily - 7 x weekly - 2 sets - 10 reps - Sidelying Hip Circles  - 1 x daily - 7 x weekly - 2 sets - 10 reps - Supine 90/90 Abdominal Bracing  - 1 x daily - 7 x weekly - 1 sets - 3-5 reps - 30 hold - Dead Bug Alternating Arm Extension  - 1 x daily - 7 x weekly - 2 sets - 10 reps - 5 hold   Asked her to work on getting up from sitting/lying every hour, work on increasing daily activity in her home.    ASSESSMENT:   CLINICAL IMPRESSION: Patient will be having her procedure 04/04/22.  She tolerated nearly the entire session in standing.  Next visit we will reassess strength and Fatigue scale.  Will likely extend her POC beyond her surgery as she is noting benefit with stamina.     OBJECTIVE IMPAIRMENTS: Abnormal gait, cardiopulmonary status limiting activity, decreased activity tolerance, decreased balance, decreased coordination, decreased endurance, decreased mobility, difficulty walking, decreased strength, dizziness, postural dysfunction, and pain.    ACTIVITY LIMITATIONS: carrying, lifting, bending, standing, squatting, stairs, bathing, hygiene/grooming, and locomotion level   PARTICIPATION LIMITATIONS: meal prep, cleaning, laundry, interpersonal relationship, shopping, community activity, and occupation   PERSONAL FACTORS: Past/current experiences and 3+ comorbidities: EDS, type III, POTS, chronic UTI  are also affecting patient's functional outcome.    REHAB  POTENTIAL: Excellent   CLINICAL DECISION MAKING: Unstable/unpredictable   EVALUATION COMPLEXITY: High     GOALS: Goals reviewed with patient? Yes   SHORT TERM GOALS: Target date 03/09/22     Patient will be independent with basic home exercise program for endurance and lower body strength Baseline: Goal status: MET   2.  Patient will complete fatigue assessment scale and goal set  Baseline: 34/80 Goal status: MET   3.  Patient will tolerate out of bed every hour for 10 minutes for 6 hours/day (60 minutes)  Baseline: has started work  Goal status: ongoing      LONG TERM GOALS: Target date: 04/06/22   Pt would like to be able to shop for 30-40 min with no more moderate fatigue  Baseline: can do  15 min with mod fatigue  Goal status: INITIAL    2.  Pt work part time with (5 hours) with no more than minimal back pain 3 out of 5 days/week Baseline: unable to work , now working 10 hour days x 2 and fatigue severe at 6 hours.  Back pain minimal  Update 03/25/22: 10 hour days, back pain moderate, fatigue mod at 6 hours  Goal status: ongoing     3.  Pt will complete ADLs, dressing/showering with minimal fatigue Baseline: very fatigued Goal status: MET    4.  Patient will be independent in final home exercise program upon discharge Baseline:  Goal status:ongoing , up to date    5.  FAS goal will improve to 28 /50 or less  Baseline: 34/50 Goal status: will check next visit    6.  Patient will demonstrate 5/5 lower extremity strength to optimize ambulation, transfers. Baseline:  Goal status: will check next visit              7. Patient will hold 90/90 position with lower body for 30 sec and no loss of neutral spine to demo increased core                      Baseline: 12 sec                       Goal status: ongoing  PLAN:   PT FREQUENCY: 2x/week   PT DURATION: 8 weeks   PLANNED INTERVENTIONS: Therapeutic exercises, Therapeutic activity, Neuromuscular re-education,  Balance training, Gait training, Patient/Family education, Self Care, Cryotherapy, Moist heat, Manual therapy, and Re-evaluation.   PLAN FOR NEXT SESSION: check  HEP for core.  Monitor vitals, bike/UBE/NuStep Advance cardio as tolerated.    [name], PT 03/25/22 8:40 AM Phone: [phone] Fax: [phone]

## 2022-03-25 ENCOUNTER — Encounter: Payer: Self-pay | Admitting: Physical Therapy

## 2022-03-25 ENCOUNTER — Ambulatory Visit: Payer: Managed Care, Other (non HMO) | Attending: Internal Medicine | Admitting: Physical Therapy

## 2022-03-25 DIAGNOSIS — M357 Hypermobility syndrome: Secondary | ICD-10-CM | POA: Diagnosis present

## 2022-03-25 DIAGNOSIS — G90A Postural orthostatic tachycardia syndrome (POTS): Secondary | ICD-10-CM | POA: Diagnosis present

## 2022-03-25 DIAGNOSIS — R42 Dizziness and giddiness: Secondary | ICD-10-CM | POA: Diagnosis present

## 2022-03-25 DIAGNOSIS — M6281 Muscle weakness (generalized): Secondary | ICD-10-CM | POA: Insufficient documentation

## 2022-03-31 NOTE — Therapy (Unsigned)
OUTPATIENT PHYSICAL THERAPY TREATMENT NOTE   Patient Name: Roberta [name] MRN: [id number] DOB:1990/12/24, 32 y.o., female Today's Date: 04/01/2022  PCP: Merri Brunette MD REFERRING PROVIDER: Merri Brunette MD   END OF SESSION:   PT End of Session - 04/01/22 0754     Visit Number 8    Number of Visits 16    Date for PT Re-Evaluation 04/06/22    Authorization Type [employer]    PT Start Time 0800    PT Stop Time 0845    PT Time Calculation (min) 45 min    Activity Tolerance Patient tolerated treatment well    Behavior During Therapy [facility] for tasks assessed/performed                   Past Medical History:  Diagnosis Date   Anxiety    Depression    Dumping syndrome    Ehlers-Danlos syndrome    Postural orthostatic tachycardia syndrome    Past Surgical History:  Procedure Laterality Date   BIOPSY  11/20/2020   Procedure: BIOPSY;  Surgeon: Bernette Redbird, MD;  Location: [facility];  Service: Endoscopy;;   ESOPHAGOGASTRODUODENOSCOPY N/A 11/20/2020   Procedure: ESOPHAGOGASTRODUODENOSCOPY (EGD);  Surgeon: Bernette Redbird, MD;  Location: [facility];  Service: Endoscopy;  Laterality: N/A;   INNER EAR SURGERY     UPPER GI ENDOSCOPY     Patient Active Problem List   Diagnosis Date Noted   Generalized anxiety disorder 07/02/2020   Dry heaves 03/28/2020   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, involving the cardia 03/28/2020   Nausea and vomiting 03/28/2020   Ehlers-Danlos disease 11/22/2019   Chronic eczematous otitis externa of both ears 12/07/2018   Excessive cerumen in both ear canals 12/07/2018   POTS (postural orthostatic tachycardia syndrome) 11/04/2014   Benign essential hypertension 10/10/2014   Lymphadenopathy 02/10/2011    REFERRING DIAG: post VP shunt   THERAPY DIAG:  Muscle weakness (generalized)  POTS (postural orthostatic tachycardia syndrome)  Hypermobility syndrome  Dizziness and giddiness  Rationale for Evaluation and Treatment  Rehabilitation  PERTINENT HISTORY: Ehlers-Danlos disease   Hypertension   Intracranial hypertension   PONV (postoperative nausea and vomiting)   Past Surgical History:  Procedure Laterality Date   Esophagogastroduodenoscopy   Ir brain stenting   Lymph node biopsy 09/19/2012    PRECAUTIONS: monitor BP  SUBJECTIVE:                                                                                                                                                                                      SUBJECTIVE STATEMENT:  Did my pre-op yesterday. Surgery 04/07/22. Still has lower belly pain, min back pain .  They said I can go back to work and can continue PT.  Asked her to bring in note to cont PT.  RTW Tues day after surgery.    PAIN:  Are you having pain? Yes: NPRS scale: 4/10 Pain location:  incision R lower quadrant Pain description: throbbing , aching  Aggravating factors: constant  Relieving factors: constant  Back pain 4/10    OBJECTIVE: (objective measures completed at initial evaluation unless otherwise dated)  DIAGNOSTIC FINDINGS:  none   VITALS:            SaO2 99% with HR 100            123/87    PATIENT SURVEYS:  FAS :Fatigue Assesment Scale TBA   SCREENING FOR RED FLAGS: Bowel or bladder incontinence: No Spinal tumors: No Cauda equina syndrome: No Compression fracture: No Abdominal aneurysm: No   COGNITION: Overall cognitive status: Within functional limits for tasks assessed                          SENSATION: WFL   MUSCLE LENGTH: Hamstrings: NT Thomas test: NT   POSTURE: rounded shoulders and forward head   PALPATION: NT    LUMBAR ROM:  NT ON EVAL  AROM eval  Flexion    Extension    Right lateral flexion    Left lateral flexion    Right rotation    Left rotation     (Blank rows = not tested)   LOWER EXTREMITY MMT:     MMT Right eval Left eval  Hip flexion 3+ 4-  Hip extension 4- 4-  Hip abduction 3 3+  Hip adduction      Hip  internal rotation      Hip external rotation      Knee flexion 4+ 4+  Knee extension 4+ 4  Ankle dorsiflexion      Ankle plantarflexion      Ankle inversion      Ankle eversion      Core strength 3/5:  12 sec table top hold legs drop and back arches  Single leg bridge 28 sec LLE and RLE 20 sec  Trunk compensation in MMT '   SENSATION: WFL     FUNCTIONAL TESTS:  EVAL: 5 times sit to stand: 17 sec 30 seconds chair stand test 2 minute walk test: 443 feet Borg 5   GAIT: Distance walked: 443 Assistive device utilized: None Level of assistance: Modified independence Comments: increased postural sway, irregular path of direction, apparent core instability    LUMBAR SPECIAL TESTS:  NT     TODAY'S TREATMENT:             [facility] Adult PT Treatment:                                                DATE: 04/01/22 Therapeutic Exercise: NuStep L 7 UE and LE for 6 min  Step ups 5 x 30 sec intervals (30 on 30 off) HR stable mid 80's  Facing back row 3 plates x 15  Facing front chest press x 15 1 plate  Chop/lift 1-2 plates x 15 , cues for core control  Flat back core work: 90/90 , dead bug variations, using ball under pelvis for challenge  Double leg knee fall out on ball Single knee fall  out Bridging with ball x 10  Added shoulder extension red band x 10   [facility] Adult PT Treatment:                                                DATE: 03/24/22 Therapeutic Exercise: Recumbent bike level 2 for 8 min  Standing core, balance: BOSU Static balance no UEs x 1 min Mini squat x 2 x 10 with UE support Sidefacing step up with BOSU then hip abduction/SLS x 15  Static balance with flat side x 1 min no UEs  Mini squat flat side up x 20 Dead lift 15 lbs x 15  Stagger dead lift x 15 lbs x 10 each side  Wall sit with 15 lbs x 30 sec x 3 added heel/toe variations , cues for neutral spine , added chest press 6 lbs for last set  Ball squeeze bridges x 15   [facility] Adult PT Treatment:                                                 DATE: 03/18/22  Before Tx 118/83 HR 86 After Tx  129/70  HR 98    Therapeutic Exercise: NuStep L6 for 8 min UE and LE  Standing core, endurance: High knee march (2 x 4 lbs DB rack position) Overhead press (2 x 4 lbs DB) x 10  Forward raise, lateral raise x 10 each , leaning into wall for support  Bilateral bicep curl 4 lbs x 20  Quadruped alternating arm lift , added legs x 8 reps, cues for stability  Supine core stabilization feet on ball for challenge, unilateral 4 lbs shoulder horizontal abduction 90/90 hold no pain, better form here 90/90 knee extension with ball  Bridge with ball x 10  Supine double arm pull (extension ) blue band with PT holding  Sideplank 30 sec each side  Front low plank knees on mat 2 x 20 sec     PATIENT EDUCATION:  Education details: PT, POC  Person educated: Patient Education method: Explanation and Verbal cues Education comprehension: verbalized understanding   HOME EXERCISE PROGRAM:  Access Code: [email or web address] URL: [email or web address] Date: 02/16/2022 Prepared by: [name]  Exercises - Supine Bridge  - 1 x daily - 7 x weekly - 2 sets - 10 reps - 5 hold - Active Straight Leg Raise with Quad Set  - 1 x daily - 7 x weekly - 2 sets - 10 reps - Sidelying Hip Circles  - 1 x daily - 7 x weekly - 2 sets - 10 reps - Supine 90/90 Abdominal Bracing  - 1 x daily - 7 x weekly - 1 sets - 3-5 reps - 30 hold - Dead Bug Alternating Arm Extension  - 1 x daily - 7 x weekly - 2 sets - 10 reps - 5 hold   Asked her to work on getting up from sitting/lying every hour, work on increasing daily activity in her home.    ASSESSMENT:   CLINICAL IMPRESSION:  Patient scheduled for surgery next week to Shunt in her abdominal wall.  She is noticing less fatigue overall better endurance with work activities.  She has been compliant with her home exercise program.  She will be reassessed cleared to return to physical therapy and  likely renewed for once weekly physical therapy sessions .   OBJECTIVE IMPAIRMENTS: Abnormal gait, cardiopulmonary status limiting activity, decreased activity tolerance, decreased balance, decreased coordination, decreased endurance, decreased mobility, difficulty walking, decreased strength, dizziness, postural dysfunction, and pain.    ACTIVITY LIMITATIONS: carrying, lifting, bending, standing, squatting, stairs, bathing, hygiene/grooming, and locomotion level   PARTICIPATION LIMITATIONS: meal prep, cleaning, laundry, interpersonal relationship, shopping, community activity, and occupation   PERSONAL FACTORS: Past/current experiences and 3+ comorbidities: EDS, type III, POTS, chronic UTI  are also affecting patient's functional outcome.    REHAB POTENTIAL: Excellent   CLINICAL DECISION MAKING: Unstable/unpredictable   EVALUATION COMPLEXITY: High     GOALS: Goals reviewed with patient? Yes   SHORT TERM GOALS: Target date 03/09/22     Patient will be independent with basic home exercise program for endurance and lower body strength Baseline: Goal status: MET   2.  Patient will complete fatigue assessment scale and goal set  Baseline: 34/80 Goal status: MET   3.  Patient will tolerate out of bed every hour for 10 minutes for 6 hours/day (60 minutes)  Baseline: has started work  Goal status: ongoing      LONG TERM GOALS: Target date: 04/06/22   Pt would like to be able to shop for 30-40 min with no more moderate fatigue  Baseline: can do 15 min with mod fatigue  Goal status: INITIAL    2.  Pt work part time with (5 hours) with no more than minimal back pain 3 out of 5 days/week Baseline: unable to work , now working 10 hour days x 2 and fatigue severe at 6 hours.  Back pain minimal  Update 03/25/22: 10 hour days, back pain moderate, fatigue mod at 6 hours  Goal status: ongoing     3.  Pt will complete ADLs, dressing/showering with minimal fatigue Baseline: very  fatigued Goal status: MET    4.  Patient will be independent in final home exercise program upon discharge Baseline:  Goal status:ongoing , up to date    5.  FAS goal will improve to 28 /50 or less  Baseline: 34/50 Goal status: will check next visit    6.  Patient will demonstrate 5/5 lower extremity strength to optimize ambulation, transfers. Baseline:  Goal status: will check next visit              7. Patient will hold 90/90 position with lower body for 30 sec and no loss of neutral spine to demo increased core                      Baseline: 12 sec                       Goal status: ongoing  PLAN:   PT FREQUENCY: 2x/week   PT DURATION: 8 weeks   PLANNED INTERVENTIONS: Therapeutic exercises, Therapeutic activity, Neuromuscular re-education, Balance training, Gait training, Patient/Family education, Self Care, Cryotherapy, Moist heat, Manual therapy, and Re-evaluation.   PLAN FOR NEXT SESSION: Reassess after her procedure,    [name], PT 04/01/22 12:05 PM Phone: [phone] Fax: [phone]

## 2022-04-01 ENCOUNTER — Ambulatory Visit: Payer: Managed Care, Other (non HMO) | Admitting: Physical Therapy

## 2022-04-01 ENCOUNTER — Encounter: Payer: Self-pay | Admitting: Physical Therapy

## 2022-04-01 DIAGNOSIS — M6281 Muscle weakness (generalized): Secondary | ICD-10-CM | POA: Diagnosis not present

## 2022-04-01 DIAGNOSIS — M357 Hypermobility syndrome: Secondary | ICD-10-CM

## 2022-04-01 DIAGNOSIS — R42 Dizziness and giddiness: Secondary | ICD-10-CM

## 2022-04-01 DIAGNOSIS — G90A Postural orthostatic tachycardia syndrome (POTS): Secondary | ICD-10-CM

## 2022-04-15 ENCOUNTER — Ambulatory Visit: Payer: Managed Care, Other (non HMO) | Admitting: Physical Therapy

## 2022-05-05 NOTE — Therapy (Unsigned)
OUTPATIENT PHYSICAL THERAPY TREATMENT NOTE RE-EVAL/DISCHARGE   Patient Name: Roberta [name] MRN: [id number] DOB:04/28/1990, 32 y.o., female Today's Date: 05/06/2022  PCP: Deland Pretty MD REFERRING PROVIDER: Deland Pretty MD   END OF SESSION:   PT End of Session - 05/06/22 1022     Visit Number 9    Number of Visits 16    Date for PT Re-Evaluation 05/06/22    Authorization Type [employer]    PT Start Time 1016    PT Stop Time 1100    PT Time Calculation (min) 44 min    Activity Tolerance Patient tolerated treatment well    Behavior During Therapy WFL for tasks assessed/performed                    Past Medical History:  Diagnosis Date   Anxiety    Depression    Dumping syndrome    Ehlers-Danlos syndrome    Postural orthostatic tachycardia syndrome    Past Surgical History:  Procedure Laterality Date   BIOPSY  11/20/2020   Procedure: BIOPSY;  Surgeon: Ronald Lobo, MD;  Location: [facility];  Service: Endoscopy;;   ESOPHAGOGASTRODUODENOSCOPY N/A 11/20/2020   Procedure: ESOPHAGOGASTRODUODENOSCOPY (EGD);  Surgeon: Ronald Lobo, MD;  Location: [facility];  Service: Endoscopy;  Laterality: N/A;   INNER EAR SURGERY     UPPER GI ENDOSCOPY     Patient Active Problem List   Diagnosis Date Noted   Generalized anxiety disorder 07/02/2020   Dry heaves 03/28/2020   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, involving the cardia 03/28/2020   Nausea and vomiting 03/28/2020   Ehlers-Danlos disease 11/22/2019   Chronic eczematous otitis externa of both ears 12/07/2018   Excessive cerumen in both ear canals 12/07/2018   POTS (postural orthostatic tachycardia syndrome) 11/04/2014   Benign essential hypertension 10/10/2014   Lymphadenopathy 02/10/2011    REFERRING DIAG: post VP shunt   THERAPY DIAG:  Muscle weakness (generalized)  POTS (postural orthostatic tachycardia syndrome)  Hypermobility syndrome  Dizziness and giddiness  Rationale for Evaluation  and Treatment Rehabilitation  PERTINENT HISTORY: Ehlers-Danlos disease   Hypertension   Intracranial hypertension   PONV (postoperative nausea and vomiting)   Past Surgical History:  Recently I saw her because despite abdominal repositioning she was still having abdominal discomfort and wanted to entertain placement of a ventricular atrial shunt. I shared with her that with her genetic disposition [MTHFR mutation] I had understood from her internist, Dr. Edwin Dada, that she may be at a high risk for clotting which is what a ventriculoatrial shunt would promote. I gave her the option to talk to her primary care doctor versus Dr. Gwynneth Aliment and she asked me to talk to the latter.  She had a ventriculoperitoneal shunt placed and unfortunately due to abdominal intolerance it was repositioned and shortened. After few days of relief, her abdominal pain and discomfort is back. Her headaches are completely resolved and she says that only occasionally she has headaches about 2 out of 10.  She wants to have this converted to a ventricular atrial shunt. I shared the procedure with her and given her hypercoagulable state, she will likely benefit from Eliquis 2 and half milligrams twice daily. She has in the previous times worked with Dr. Gwynneth Aliment, hospitalist, and she wants me to discuss this with him. I will have a Zoom call with her on Monday after I have discussed this with him and we will plan for ventriculoperitoneal to ventricular atrial conversion.  Esophagogastroduodenoscopy   Ir  brain stenting   Lymph node biopsy 09/19/2012    PRECAUTIONS: monitor BP  SUBJECTIVE:                                                                                                                                                                                      SUBJECTIVE STATEMENT:  The procedure did not work.  I need to have another neurosurgery to convert shunt from VP to [place].  She has been working through the past  month.  Still has abdominal pain .  Needs to see Hematology before the procedure to establish appropriate anticoagulation. She has not felt up to doing HEP lately. No headaches and no back pain.  BP stable.     PAIN:  Are you having pain? Yes: NPRS scale: 4/10 Pain location:  incision R lower quadrant Pain description: throbbing , aching  Aggravating factors: constant  Relieving factors: constant  Back pain 4/10    OBJECTIVE: (objective measures completed at initial evaluation unless otherwise dated)  DIAGNOSTIC FINDINGS:  none   VITALS:            SaO2 99% with HR 100            123/87    PATIENT SURVEYS:  FAS :Fatigue Assesment Scale 34/50 05/06/22 : 24/50   SCREENING FOR RED FLAGS: Bowel or bladder incontinence: No Spinal tumors: No Cauda equina syndrome: No Compression fracture: No Abdominal aneurysm: No   COGNITION: Overall cognitive status: Within functional limits for tasks assessed                          SENSATION: WFL   MUSCLE LENGTH: Hamstrings: NT Thomas test: NT   POSTURE: rounded shoulders and forward head   PALPATION: NT    LUMBAR ROM:  NT ON EVAL  AROM 05/06/22  Flexion  10-15% limited ab discomfort  Extension  WNL  Right lateral flexion    Left lateral flexion    Right rotation WNL    Left rotation WNL     (Blank rows = not tested)   LOWER EXTREMITY MMT:     MMT Right eval Left eval R/L 05/06/22  Hip flexion 3+ 4- 4-/5  Hip extension 4- 4-   Hip abduction 3 3+ 4-/5  Hip adduction       Hip internal rotation       Hip external rotation       Knee flexion 4+ 4+ 4/5  Knee extension 4+ 4 4-/5  Ankle dorsiflexion       Ankle plantarflexion       Ankle inversion  Ankle eversion         Core strength 3/5:  12 sec table top hold legs drop and back arches  05/06/22: Able to hold 90/90 with cues for 30 sec no pain in back or core, but very unstable     Single leg bridge 28 sec LLE and RLE 20 sec  05/06/22: Lt.and  Rt LE < 10  sec  Trunk compensation in MMT '   SENSATION: WFL     FUNCTIONAL TESTS:  EVAL: 5 times sit to stand: 17 sec 05/06/22 19 sec   2 minute walk test: 443 feet Borg 5 NT on DC    GAIT: Distance walked: 443 Assistive device utilized: None Level of assistance: Modified independence Comments: increased postural sway, irregular path of direction, apparent core instability    LUMBAR SPECIAL TESTS:  NT     TODAY'S TREATMENT:              [facility] Adult PT Treatment:                                                DATE: 05/06/22 Therapeutic Exercise: Supine isometric abdominal hold using ball 5 seconds x 10 9090 core isometric hold hands pressed on thighs Bridges with physioball  kneeling physioball forearm plank Side-lying hip abduction Self Care: Discussed plan of care and agreed to discontinue therapy at this time until her medical condition has stabilized and she can complete a neurosurgery procedure. Continue HEP and upgraded that today Discussed you tube channel for EDS  and hypermobility resources including Alla German Bon and the hypermobile DPT   [facility] Adult PT Treatment:                                                DATE: 04/01/22 Therapeutic Exercise: NuStep L 7 UE and LE for 6 min  Step ups 5 x 30 sec intervals (30 on 30 off) HR stable mid 80's  Facing back row 3 plates x 15  Facing front chest press x 15 1 plate  Chop/lift 1-2 plates x 15 , cues for core control  Flat back core work: 90/90 , dead bug variations, using ball under pelvis for challenge  Double leg knee fall out on ball Single knee fall out Bridging with ball x 10  Added shoulder extension red band x 10   [facility] Adult PT Treatment:                                                DATE: 03/24/22 Therapeutic Exercise: Recumbent bike level 2 for 8 min  Standing core, balance: BOSU Static balance no UEs x 1 min Mini squat x 2 x 10 with UE support Sidefacing step up with BOSU then hip abduction/SLS x 15  Static  balance with flat side x 1 min no UEs  Mini squat flat side up x 20 Dead lift 15 lbs x 15  Stagger dead lift x 15 lbs x 10 each side  Wall sit with 15 lbs x 30 sec x 3 added heel/toe variations , cues for neutral spine ,  added chest press 6 lbs for last set  Ball squeeze bridges x 15   [facility] Adult PT Treatment:                                                DATE: 03/18/22  Before Tx 118/83 HR 86 After Tx  129/70  HR 98    Therapeutic Exercise: NuStep L6 for 8 min UE and LE  Standing core, endurance: High knee march (2 x 4 lbs DB rack position) Overhead press (2 x 4 lbs DB) x 10  Forward raise, lateral raise x 10 each , leaning into wall for support  Bilateral bicep curl 4 lbs x 20  Quadruped alternating arm lift , added legs x 8 reps, cues for stability  Supine core stabilization feet on ball for challenge, unilateral 4 lbs shoulder horizontal abduction 90/90 hold no pain, better form here 90/90 knee extension with ball  Bridge with ball x 10  Supine double arm pull (extension ) blue band with PT holding  Sideplank 30 sec each side  Front low plank knees on mat 2 x 20 sec     PATIENT EDUCATION:  Education details: PT, POC  Person educated: Patient Education method: Merchandiser, retail cues Education comprehension: verbalized understanding   HOME EXERCISE PROGRAM: Access Code: [email or web address] URL: [email or web address] Date: 05/06/2022 Prepared by: [name]  Exercises - Supine Bridge  - 1 x daily - 7 x weekly - 2 sets - 10 reps - 5 hold - Active Straight Leg Raise with Quad Set  - 1 x daily - 7 x weekly - 2 sets - 10 reps - Sidelying Hip Circles  - 1 x daily - 7 x weekly - 2 sets - 10 reps - Supine 90/90 Abdominal Bracing  - 1 x daily - 7 x weekly - 1 sets - 3-5 reps - 30 hold - Dead Bug Alternating Arm Extension  - 1 x daily - 7 x weekly - 2 sets - 10 reps - 5 hold - Abdominal Press into [place]  - 1 x daily - 7 x weekly - 2 sets - 10 reps - 10 hold -  Bridge with Arms at [employer] and Feet on [employer]  - 1 x daily - 7 x weekly - 2 sets - 10 reps - 10 hold - Kneeling Pushup Plus on Elbows  - 1 x daily - 7 x weekly - 2 sets - 10 reps - 10-15 hold   Asked her to work on getting up from sitting/lying every hour, work on increasing daily activity in her home.    ASSESSMENT:   CLINICAL IMPRESSION:  Patient returns from physical therapy following her ventricular peritoneal shunt.  Unfortunate procedure was not fully effective and she will now need neurosurgery as an alternative for this shunt.  She was given an updated HEP and also discussed gentle consistent exercises needed at home.  I also feel she would benefit from a exercise platform that she can stream in her home to give her variety and more interesting exercise routines safe for her condition.  Her blood pressure and pain levels have stabilized.  She is still very deconditioned and shows weakness in her core lower extremities and upper body.  I think it is in her best interest to complete the procedure and consider return to physical therapy perhaps with a  neuro rehab focus once she is stabilized and cleared to do so.  She is discharged at this time.     OBJECTIVE IMPAIRMENTS: Abnormal gait, cardiopulmonary status limiting activity, decreased activity tolerance, decreased balance, decreased coordination, decreased endurance, decreased mobility, difficulty walking, decreased strength, dizziness, postural dysfunction, and pain.    ACTIVITY LIMITATIONS: carrying, lifting, bending, standing, squatting, stairs, bathing, hygiene/grooming, and locomotion level   PARTICIPATION LIMITATIONS: meal prep, cleaning, laundry, interpersonal relationship, shopping, community activity, and occupation   PERSONAL FACTORS: Past/current experiences and 3+ comorbidities: EDS, type III, POTS, chronic UTI  are also affecting patient's functional outcome.    REHAB POTENTIAL: Excellent   CLINICAL DECISION MAKING:  Unstable/unpredictable   EVALUATION COMPLEXITY: High     GOALS: Goals reviewed with patient? Yes   SHORT TERM GOALS: Target date 03/09/22     Patient will be independent with basic home exercise program for endurance and lower body strength Baseline: Goal status: MET   2.  Patient will complete fatigue assessment scale and goal set  Baseline: 34/80 Goal status: MET   3.  Patient will tolerate out of bed every hour for 10 minutes for 6 hours/day (60 minutes)  Baseline: has started work  Goal status: MET     LONG TERM GOALS: Target date: 04/06/22   Pt would like to be able to shop for 30-40 min with no more moderate fatigue  Baseline: can do 15 min with mod fatigue  Goal status: NOT MET   2.  Pt work part time with (5 hours) with no more than minimal back pain 3 out of 5 days/week Baseline: unable to work , now working 10 hour days x 2 and fatigue severe at 6 hours.  Back pain minimal  Update 03/25/22: 10 hour days, back pain moderate, fatigue mod at 6 hours  05/06/22: no longer has back pain (kidney infection gone) , 10 hour Goal status: MET     3.  Pt will complete ADLs, dressing/showering with minimal fatigue Baseline: very fatigued Goal status: MET    4.  Patient will be independent in final home exercise program upon discharge Baseline:  Goal status:MET   5.  FAS goal will improve to 28 /50 or less  Baseline: 34/50 05/06/22: 24/50 Goal status: MET    6.  Patient will demonstrate 5/5 lower extremity strength to optimize ambulation, transfers. Baseline:  Goal status: NOT MET              7. Patient will hold 90/90 position with lower body for 30 sec and no loss of neutral spine to demo increased core                      Baseline: 12 sec , 05/06/22: can hold legs up: but leans laterally and very unstable                       Goal status: NOT MET  PLAN:   PT FREQUENCY: 2x/week   PT DURATION: 8 weeks   PLANNED INTERVENTIONS: Therapeutic exercises, Therapeutic  activity, Neuromuscular re-education, Balance training, Gait training, Patient/Family education, Self Care, Cryotherapy, Moist heat, Manual therapy, and Re-evaluation.   PLAN FOR NEXT SESSION: DC   PHYSICAL THERAPY DISCHARGE SUMMARY  Visits from Start of Care: 9  Current functional level related to goals / functional outcomes: See above for most recent information  patient is working 10-hour days and no longer has back pain   Remaining deficits: Weakness,  proprioception, balance, endurance, coordination   Education / Equipment: HEP, core, general physical activity recommendations  Patient agrees to discharge. Patient goals were partially met. Patient is being discharged due to a change in medical status.    [name], PT 05/06/22 12:14 PM Phone: [phone] Fax: [phone]

## 2022-05-06 ENCOUNTER — Ambulatory Visit: Payer: Managed Care, Other (non HMO) | Attending: Internal Medicine | Admitting: Physical Therapy

## 2022-05-06 ENCOUNTER — Encounter: Payer: Self-pay | Admitting: Physical Therapy

## 2022-05-06 DIAGNOSIS — G90A Postural orthostatic tachycardia syndrome (POTS): Secondary | ICD-10-CM

## 2022-05-06 DIAGNOSIS — R42 Dizziness and giddiness: Secondary | ICD-10-CM | POA: Diagnosis present

## 2022-05-06 DIAGNOSIS — M6281 Muscle weakness (generalized): Secondary | ICD-10-CM | POA: Diagnosis present

## 2022-05-06 DIAGNOSIS — M357 Hypermobility syndrome: Secondary | ICD-10-CM | POA: Diagnosis present

## 2022-05-13 ENCOUNTER — Ambulatory Visit: Payer: Managed Care, Other (non HMO) | Admitting: Physical Therapy

## 2022-05-20 ENCOUNTER — Ambulatory Visit: Payer: Managed Care, Other (non HMO) | Admitting: Physical Therapy

## 2022-07-05 ENCOUNTER — Encounter: Payer: Self-pay | Admitting: *Deleted

## 2022-09-16 DIAGNOSIS — F331 Major depressive disorder, recurrent, moderate: Secondary | ICD-10-CM | POA: Insufficient documentation

## 2022-11-18 DIAGNOSIS — R55 Syncope and collapse: Secondary | ICD-10-CM | POA: Insufficient documentation

## 2022-12-30 DIAGNOSIS — H93A1 Pulsatile tinnitus, right ear: Secondary | ICD-10-CM | POA: Insufficient documentation

## 2023-03-31 ENCOUNTER — Encounter: Payer: Self-pay | Admitting: Physical Therapy

## 2023-03-31 ENCOUNTER — Ambulatory Visit: Payer: Managed Care, Other (non HMO) | Attending: Internal Medicine | Admitting: Physical Therapy

## 2023-03-31 ENCOUNTER — Other Ambulatory Visit: Payer: Self-pay

## 2023-03-31 VITALS — BP 132/95 | HR 81

## 2023-03-31 DIAGNOSIS — G90A Postural orthostatic tachycardia syndrome (POTS): Secondary | ICD-10-CM | POA: Diagnosis present

## 2023-03-31 DIAGNOSIS — M6281 Muscle weakness (generalized): Secondary | ICD-10-CM | POA: Diagnosis present

## 2023-03-31 DIAGNOSIS — M357 Hypermobility syndrome: Secondary | ICD-10-CM

## 2023-03-31 NOTE — Therapy (Signed)
 OUTPATIENT PHYSICAL THERAPY THORACOLUMBAR EVALUATION   Patient Name: Roberta [name] MRN: [id number] DOB:06-Apr-1990, 33 y.o., female Today's Date: 03/31/2023  END OF SESSION:  PT End of Session - 03/31/23 1415     Visit Number 1    PT Start Time 1415    Activity Tolerance Patient tolerated treatment well;Patient limited by fatigue             Past Medical History:  Diagnosis Date   Anxiety    Depression    Dumping syndrome    Ehlers-Danlos syndrome    Postural orthostatic tachycardia syndrome    Past Surgical History:  Procedure Laterality Date   BIOPSY  11/20/2020   Procedure: BIOPSY;  Surgeon: Donnald Charleston, MD;  Location: [facility];  Service: Endoscopy;;   ESOPHAGOGASTRODUODENOSCOPY N/A 11/20/2020   Procedure: ESOPHAGOGASTRODUODENOSCOPY (EGD);  Surgeon: Donnald Charleston, MD;  Location: [facility];  Service: Endoscopy;  Laterality: N/A;   INNER EAR SURGERY     Right ventriculoateral shunt revision Right 2024   UPPER GI ENDOSCOPY     Patient Active Problem List   Diagnosis Date Noted   Generalized anxiety disorder 07/02/2020   Dry heaves 03/28/2020   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, involving the cardia 03/28/2020   Nausea and vomiting 03/28/2020   Ehlers-Danlos disease 11/22/2019   Chronic eczematous otitis externa of both ears 12/07/2018   Excessive cerumen in both ear canals 12/07/2018   POTS (postural orthostatic tachycardia syndrome) 11/04/2014   Benign essential hypertension 10/10/2014   Lymphadenopathy 02/10/2011    PCP: Clarice Nottingham, MD  REFERRING PROVIDER:  Royden Ronal Czar, FNP   REFERRING DIAG: Post op weakness  Rationale for Evaluation and Treatment: Rehabilitation  THERAPY DIAG:  Muscle weakness (generalized)  POTS (postural orthostatic tachycardia syndrome)  Hypermobility syndrome  ONSET DATE: 02/22/2023  SUBJECTIVE:                                                                                                                                                                                            SUBJECTIVE STATEMENT:  Patient arrives to PT following Right ventriculoateral shunt revision that occurred on 02/22/2023. She was readmitted after she was d/c due excessive N/V eventually D/C 3 days later. Since she was discharged she reports feeling overall fatigued and weak. She did report falling down a flight of steps (10 steps) on 12/15, but notes she didn't hurt her head oranything else and notes she was just bruised, but didn't mention this to her MD. 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   PERTINENT HISTORY:  See flow  PAIN:  Are you having pain? No  PRECAUTIONS: Fall  RED FLAGS: None   WEIGHT BEARING RESTRICTIONS: No  FALLS:  Has patient fallen in last 6 months? Yes. Number of falls 1  LIVING ENVIRONMENT: Lives with: lives alone Lives in: House/apartment Stairs:  Elevator  Has following equipment at home: None  OCCUPATION: SLP  PLOF: Independent  PATIENT GOALS: Endurance, working without feeling fatigued/ tired, taking the dog for a walk without issues.    OBJECTIVE:  Note: Objective measures were completed at Evaluation unless otherwise noted.  DIAGNOSTIC FINDINGS:  See chart  PATIENT SURVEYS:  FOTO 64%, predicted 67%  COGNITION: Overall cognitive status: Within functional limits for tasks assessed      POSTURE: rounded shoulders, forward head, and flexed trunk   PALPATION: TTP along the R >L scalenes/ SCM, upper trap/ levator scapulae.   CERVICAL ROM:   Active ROM A/PROM (deg) eval  Flexion 60  Extension 40  Right lateral flexion 56  Left lateral flexion 52  Right rotation 40  Left rotation 20   (Blank rows = not tested)  LOWER EXTREMITY ROM:      Active  Right eval Left eval  Hip flexion [facility]  Hip extension [facility]  Hip abduction [facility]  Hip adduction [facility]  Hip internal rotation [facility]  Hip external rotation [facility]  Knee flexion [facility] WFL  Knee extension [facility] WFL  Ankle dorsiflexion [facility] WFL  Ankle plantarflexion    Ankle inversion    Ankle eversion     (Blank rows = not tested)  LOWER EXTREMITY MMT:    MMT Right eval Left eval  Hip flexion 4 4  Hip extension 3+ 3+  Hip abduction 3+ 3+  Hip adduction 4 4  Hip internal rotation 4- 4-  Hip external rotation 4- 4-  Knee flexion 4- 4  Knee extension 4- 4  Ankle dorsiflexion 4- 4-  Ankle plantarflexion 4 4  Ankle inversion 4 4  Ankle eversion 4 4   (Blank rows = not tested)  UPPER EXTREMITY MMT:  MMT Right eval Left eval  Shoulder flexion 4- 3+  Shoulder extension 4 4  Shoulder abduction 3+ 3+  Shoulder adduction    Shoulder extension    Shoulder internal rotation 4 4  Shoulder external rotation 4- 3+  Middle trapezius 4- 4-  Lower trapezius 4- 4-  Elbow flexion 4- 4-  Elbow extension 4 4  Wrist flexion    Wrist extension    Wrist ulnar deviation    Wrist radial deviation    Wrist pronation    Wrist supination    Grip strength     (Blank rows = not tested)  UPPER EXTREMITY ROM:  Active ROM Right eval Left eval  Shoulder flexion [facility]  Shoulder extension [facility]  Shoulder abduction [facility] WFL  Shoulder adduction [facility] WFL  Shoulder extension [facility]  Shoulder internal rotation    Shoulder external rotation    Elbow flexion    Elbow extension    Wrist flexion    Wrist extension    Wrist ulnar deviation    Wrist radial deviation    Wrist pronation    Wrist supination     (Blank rows = not tested)   Note: Core strength 3/5 in supine with (with arms extended)  FUNCTIONAL TESTS:  30 seconds chair stand test 7 reps  GAIT: Distance walked: 100 ft to treatment area Assistive device utilized: None Level of assistance: Complete  Independence Comments: Flexed trunk, varying stride lengths bil and postural sway noted  TREATMENT [facility] Adult PT Treatment:                                                DATE: 03/31/2023 Vitals:   03/31/23 1426 03/31/23 1500  BP: (!) 141/97 (!) 132/95  Pulse: 76 81  SpO2: 98% 98%   Therapeutic Exercise: Bridge 2 x 10 with arms crossed to promote core activation SLR 1 x 10 bil Sidelying hip abduction 1 x 10 bil  Seated transverse abdominis activation 1 x 5 holding 5 seconds Scapular retraction 1 x 10 with RTB  Provided initial HEP                                                                                                         PATIENT EDUCATION:  Education details: evaluation findings, POC, goals, HEP with proper form/ rationale. Person educated: Patient Education method: Explanation, Demonstration, Verbal cues, and Handouts Education comprehension: verbalized understanding  HOME EXERCISE PROGRAM: Access Code: [email or web address] URL: [email or web address] Date: 03/31/2023 Prepared by: Joneen Fresh  Exercises - supine bridge w/ brace  - 1 x daily - 7 x weekly - 2 sets - 10 reps - SLR  - 1 x daily - 7 x weekly - 2 sets - 10 reps - 1 hold - Sidelying Hip Abduction  - 1 x daily - 7 x weekly - 2 sets - 10 reps - Seated Transversus Abdominis Bracing  - 1 x daily - 7 x weekly - 2 sets - 10 reps - 5 second hold - Scapular retraction with ER (MONEY)  - 1 x daily - 7 x weekly - 2 sets - 10 reps  ASSESSMENT:  CLINICAL IMPRESSION: Patient is a 33 y.o. F who was seen today for physical therapy evaluation and treatment for General weakness following her Right ventriculoateral shunt revision on 02/22/2023. She demonstrate function ROM with the exception of limited cervical L rotation. She demonstrates gross weakness in both bil UE/LE, seated /standing she demonstrates postural sway.  She reports have 1 fall down a flight of steps noting she only had bruises and didn't hit her  head but also didn't notify her MD or neurosurgeon. Vitals were assessed before and after session with BP starting at 141/97 and exhibited improvement at end of session following exercise in both supine and seated  position. Charmin reports challenges with endurance and fatigue and noted it has been more challenging this week due to return to work full status and had worked 3 consecutive 10 hours shifts. She would benefit from physical therapy to decrease neck stiffness/ tenderness, improve gross UE/LE strength, maximize stability and balance.   OBJECTIVE IMPAIRMENTS: decreased activity tolerance, decreased endurance, decreased ROM, decreased strength, improper body mechanics, and postural  dysfunction.   ACTIVITY LIMITATIONS: carrying, lifting, bending, standing, squatting, reach over head, and locomotion level  PARTICIPATION LIMITATIONS: shopping, community activity, and occupation  PERSONAL FACTORS: Behavior pattern, Past/current experiences, Time since onset of injury/illness/exacerbation, and 1-2 comorbidities: POTS, EDS  are also affecting patient's functional outcome.   REHAB POTENTIAL: Good  CLINICAL DECISION MAKING: Evolving/moderate complexity  EVALUATION COMPLEXITY: Moderate   GOALS: Goals reviewed with patient? Yes  SHORT TERM GOALS: Target date: 04/28/2023  Pt to be IND with initial HEP for therapeutic progression Baseline: Goal status: INITIAL  LONG TERM GOALS: Target date: 05/26/2023  I'm gross UE strength to >/= 4/5 to promote stablity lifting/ carrying activities / tasks Baseline:  Goal status: INITIAL  2.  Improve gross LE strnegth to >/= 4/5 to assist with lifting mechanics and maintaining posture and stability  Baseline:  Goal status: INITIAL  3.  Improve FOTO score to >/= 68% to demo improvement in function Baseline:  Goal status: INITIAL  4.  Pt to be able to work a 10 hour day performing regular work tasks reporting </= min fatigue to demonstrate improvement in  endurance. Baseline:  Goal status: INITIAL  5.  Improve core stength to >/= 4/5 to promote trunk stability/ reduce postural sway and maximize safety with work and ADLs Baseline:  Goal status: INITIAL  6.  Pt to be IND with all HEP and will be able to maintain and progress her current LOF IND Baseline:  Goal status: INITIAL  PLAN:  PT FREQUENCY: 1-2x/week  PT DURATION: 8 weeks  PLANNED INTERVENTIONS: 97164- PT Re-evaluation, 97110-Therapeutic exercises, 97530- Therapeutic activity, 97112- Neuromuscular re-education, 97535- Self Care, [id number]- Manual therapy, [id number]- Gait training, 97014- Electrical stimulation (unattended), Patient/Family education, Dry Needling, Cryotherapy, and Moist heat.  PLAN FOR NEXT SESSION: Review/ update HEP.   Renesha Lizama PT, DPT, LAT, ATC  03/31/23  3:27 PM

## 2023-04-12 ENCOUNTER — Other Ambulatory Visit: Payer: Self-pay | Admitting: Medical Genetics

## 2023-04-14 ENCOUNTER — Encounter: Payer: Self-pay | Admitting: Physical Therapy

## 2023-04-14 ENCOUNTER — Ambulatory Visit: Payer: Managed Care, Other (non HMO) | Admitting: Physical Therapy

## 2023-04-14 DIAGNOSIS — M6281 Muscle weakness (generalized): Secondary | ICD-10-CM

## 2023-04-14 DIAGNOSIS — M357 Hypermobility syndrome: Secondary | ICD-10-CM

## 2023-04-14 DIAGNOSIS — G90A Postural orthostatic tachycardia syndrome (POTS): Secondary | ICD-10-CM

## 2023-04-14 NOTE — Therapy (Signed)
OUTPATIENT PHYSICAL THERAPY THORACOLUMBAR EVALUATION   Patient Name: Roberta [name] MRN: [id number] DOB:November 07, 1990, 33 y.o., female Today's Date: 04/14/2023  END OF SESSION:  PT End of Session - 04/14/23 1324     Visit Number 2    Number of Visits 16    Date for PT Re-Evaluation 05/26/23    Authorization Type cigna [id number]    PT Start Time 1330    PT Stop Time 1415    PT Time Calculation (min) 45 min    Activity Tolerance Patient tolerated treatment well;Patient limited by fatigue              Past Medical History:  Diagnosis Date   Anxiety    Depression    Dumping syndrome    Ehlers-Danlos syndrome    Postural orthostatic tachycardia syndrome    Past Surgical History:  Procedure Laterality Date   BIOPSY  11/20/2020   Procedure: BIOPSY;  Surgeon: Bernette Redbird, MD;  Location: [facility];  Service: Endoscopy;;   ESOPHAGOGASTRODUODENOSCOPY N/A 11/20/2020   Procedure: ESOPHAGOGASTRODUODENOSCOPY (EGD);  Surgeon: Bernette Redbird, MD;  Location: [facility];  Service: Endoscopy;  Laterality: N/A;   INNER EAR SURGERY     Right ventriculoateral shunt revision Right 2024   UPPER GI ENDOSCOPY     Patient Active Problem List   Diagnosis Date Noted   Generalized anxiety disorder 07/02/2020   Dry heaves 03/28/2020   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, involving the cardia 03/28/2020   Nausea and vomiting 03/28/2020   Ehlers-Danlos disease 11/22/2019   Chronic eczematous otitis externa of both ears 12/07/2018   Excessive cerumen in both ear canals 12/07/2018   POTS (postural orthostatic tachycardia syndrome) 11/04/2014   Benign essential hypertension 10/10/2014   Lymphadenopathy 02/10/2011    PCP: Merri Brunette, MD  REFERRING PROVIDER:  Fatima Sanger, FNP   REFERRING DIAG: Post op weakness  Rationale for Evaluation and Treatment: Rehabilitation  THERAPY DIAG:  Muscle weakness (generalized)  POTS (postural orthostatic tachycardia  syndrome)  Hypermobility syndrome  ONSET DATE: 02/22/2023  SUBJECTIVE:                                                                                                                                                                                           SUBJECTIVE STATEMENT:  I went back to work last week and it went well.  I am back with babies.  That is my happy place.  I have no headaches.  I have neck pain and shoulder pain  I feel tight. I am going to [facility] to find out about Sudden Cardiac testing next week. I did fall in my  house as I told Anne Ng and I just slipped as I was getting out of the shower and I walked  out of my bedroom with wet feet and slipped   EVAL Patient arrives to PT following Right ventriculoateral shunt revision that occurred on 02/22/2023. She was readmitted after she was d/c due excessive N/V eventually D/C 3 days later. Since she was discharged she reports feeling overall fatigued and weak. She did report falling down a flight of steps (10 steps) on 12/15, but notes she didn't hurt her head oranything else and notes she was just bruised, but didn't mention this to her MD. 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   PERTINENT HISTORY:  See flow  PAIN:  Are you having pain? No  PRECAUTIONS: Fall  RED FLAGS: None   WEIGHT BEARING RESTRICTIONS: No  FALLS:  Has patient fallen in last 6 months? Yes. Number of falls 1  LIVING ENVIRONMENT: Lives with: lives alone Lives in: House/apartment Stairs:  Elevator  Has following equipment at home: None  OCCUPATION: SLP  PLOF: Independent  PATIENT GOALS: Endurance, working without feeling fatigued/ tired, taking the dog for a walk without issues.    OBJECTIVE:  Note: Objective measures were  completed at Evaluation unless otherwise noted.  DIAGNOSTIC FINDINGS:  See chart  PATIENT SURVEYS:  FOTO 64%, predicted 67%  COGNITION: Overall cognitive status: Within functional limits for tasks assessed      POSTURE: rounded shoulders, forward head, and flexed trunk   PALPATION: TTP along the R >L scalenes/ SCM, upper trap/ levator scapulae.   CERVICAL ROM:   Active ROM A/PROM (deg) eval  Flexion 60  Extension 40  Right lateral flexion 56  Left lateral flexion 52  Right rotation 40  Left rotation 20   (Blank rows = not tested)  LOWER EXTREMITY ROM:     Active  Right eval Left eval  Hip flexion [facility]  Hip extension [facility]  Hip abduction [facility]  Hip adduction [facility]  Hip internal rotation [facility]  Hip external rotation [facility]  Knee flexion [facility] WFL  Knee extension [facility] WFL  Ankle dorsiflexion [facility] WFL  Ankle plantarflexion    Ankle inversion    Ankle eversion     (Blank rows = not tested)  LOWER EXTREMITY MMT:    MMT Right eval Left eval  Hip flexion 4 4  Hip extension 3+ 3+  Hip abduction 3+ 3+  Hip adduction 4 4  Hip internal rotation 4- 4-  Hip external rotation 4- 4-  Knee flexion 4- 4  Knee extension 4- 4  Ankle dorsiflexion 4- 4-  Ankle plantarflexion 4 4  Ankle inversion 4 4  Ankle eversion 4 4   (Blank rows = not tested)  UPPER EXTREMITY MMT:  MMT Right eval Left eval  Shoulder flexion 4- 3+  Shoulder extension 4 4  Shoulder abduction 3+ 3+  Shoulder adduction    Shoulder extension    Shoulder internal rotation 4 4  Shoulder external rotation 4- 3+  Middle trapezius 4- 4-  Lower trapezius 4- 4-  Elbow flexion 4- 4-  Elbow extension 4 4  Wrist flexion    Wrist extension    Wrist ulnar deviation    Wrist radial deviation  Wrist pronation    Wrist supination    Grip strength     (Blank rows = not tested)  UPPER EXTREMITY ROM:  Active ROM Right eval Left eval  Shoulder flexion [facility] WFL  Shoulder extension [facility]  Shoulder abduction [facility] WFL  Shoulder adduction [facility] WFL  Shoulder extension [facility]  Shoulder internal rotation    Shoulder external rotation    Elbow flexion    Elbow extension    Wrist flexion    Wrist extension    Wrist ulnar deviation    Wrist radial deviation    Wrist pronation    Wrist supination     (Blank rows = not tested)   Note: Core strength 3/5 in supine with (with arms extended)  FUNCTIONAL TESTS:  30 seconds chair stand test 7 reps  GAIT: Distance walked: 100 ft to treatment area Assistive device utilized: None Level of assistance: Complete Independence Comments: Flexed trunk, varying stride lengths bil and postural sway noted  TREATMENT [facility] Adult PT Treatment:                                                DATE: 04-14-23 BP 134/94,  pulse ox 72   pr 64 Supine bridge test 80.30 sec  Therapeutic Exercise: Bridge 2 x 10 with arms crossed to promote core activation supine transverse abdominis activation 1 x 5 holding 10 seconds SLR 1 x 10 bil Sidelying hip abduction 1 x 10 bil  Tempo squats with 3 pauses on stand to sit without UE support Sit to stand with 10 # DB with RTB around knees to prevent valgus knees in sit to stand 2 x 10 Pulse ox   98%    PR  90  Manual Therapy: Suboccipital release bil Scalene myofascial release on left side of neck AROM with over pressure in all planes of cervical UPA on left side of C-3 to C-6 PA mob C-3 to C-6  [facility] Adult PT Treatment:                                                DATE: 03/31/2023 There were no vitals filed for this visit.  Therapeutic Exercise: Bridge 2 x 10 with arms crossed to promote core activation SLR 1 x 10 bil Sidelying hip abduction 1 x 10 bil  Seated transverse abdominis activation 1 x 5 holding 5 seconds Scapular retraction 1 x 10 with RTB  Provided initial HEP                                                                                                         PATIENT EDUCATION:   Education details: evaluation findings, POC, goals, HEP with proper form/ rationale. Person educated: Patient Education method: Explanation, Demonstration, Verbal cues, and Handouts Education  comprehension: verbalized understanding  HOME EXERCISE PROGRAM: Access Code: [email or web address] URL: [email or web address] Date: 03/31/2023 Prepared by: Lulu Riding  Exercises - supine bridge w/ brace  - 1 x daily - 7 x weekly - 2 sets - 10 reps - SLR  - 1 x daily - 7 x weekly - 2 sets - 10 reps - 1 hold - Sidelying Hip Abduction  - 1 x daily - 7 x weekly - 2 sets - 10 reps - Seated Transversus Abdominis Bracing  - 1 x daily - 7 x weekly - 2 sets - 10 reps - 5 second hold - Scapular retraction with ER (MONEY)  - 1 x daily - 7 x weekly - 2 sets - 10 reps Added1-23-25 - Seated Gentle Upper Trapezius Stretch  - 1 x daily - 7 x weekly - 1 sets - 3 reps - 30 hold - Gentle Levator Scapulae Stretch  - 1 x daily - 7 x weekly - 3 sets - 3 reps - 30 hold ASSESSMENT:  CLINICAL IMPRESSION: Pt enters clinic with no headaches and reports that she will be going to [facility] to find out about Sudden Cardiac testing next week.  BP monitored during clinic with no adverse effects.  Pt able to perform all and added cervical stretches for pt to utilize during work week.  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  Pt with sit to stand and added weight of 10 lb and need for resistance band to prevent valgus knees.  Pt is making good progress toward completing goals and increasing strength.    EVAL- Patient is a 33 y.o. F who was seen today for physical therapy evaluation and treatment for General weakness following her Right ventriculoateral shunt revision on 02/22/2023. She demonstrate function ROM with the exception of limited cervical L rotation. She demonstrates gross weakness in both bil UE/LE, seated /standing she demonstrates postural sway.  She reports have 1 fall down a  flight of steps noting she only had bruises and didn't hit her head but also didn't notify her MD or neurosurgeon. Vitals were assessed before and after session with BP starting at 141/97 and exhibited improvement at end of session following exercise in both supine and seated  position. Halston reports challenges with endurance and fatigue and noted it has been more challenging this week due to return to work full status and had worked 3 consecutive 10 hours shifts. She would benefit from physical therapy to decrease neck stiffness/ tenderness, improve gross UE/LE strength, maximize stability and balance.   OBJECTIVE IMPAIRMENTS: decreased activity tolerance, decreased endurance, decreased ROM, decreased strength, improper body mechanics, and postural dysfunction.   ACTIVITY LIMITATIONS: carrying, lifting, bending, standing, squatting, reach over head, and locomotion level  PARTICIPATION LIMITATIONS: shopping, community activity, and occupation  PERSONAL FACTORS: Behavior pattern, Past/current experiences, Time since onset of injury/illness/exacerbation, and 1-2 comorbidities: POTS, EDS  are also affecting patient's functional outcome.   REHAB POTENTIAL: Good  CLINICAL DECISION MAKING: Evolving/moderate complexity  EVALUATION COMPLEXITY: Moderate   GOALS: Goals reviewed with patient? Yes  SHORT TERM GOALS: Target date: 04/28/2023  Pt to be IND with initial HEP for therapeutic progression Baseline: Goal status: INITIAL  LONG TERM GOALS: Target date: 05/26/2023  I'm gross UE strength to >/= 4/5 to promote stablity lifting/ carrying activities / tasks Baseline:  Goal status: INITIAL  2.  Improve gross LE strnegth to >/= 4/5 to assist with lifting mechanics and maintaining posture and stability  Baseline:  Goal status: INITIAL  3.  Improve FOTO score to >/= 68% to demo improvement in function Baseline:  Goal status: INITIAL  4.  Pt to be able to work a 10 hour day performing regular  work tasks reporting </= min fatigue to demonstrate improvement in endurance. Baseline:  Goal status: INITIAL  5.  Improve core stength to >/= 4/5 to promote trunk stability/ reduce postural sway and maximize safety with work and ADLs Baseline:  Goal status: INITIAL  6.  Pt to be IND with all HEP and will be able to maintain and progress her current LOF IND Baseline:  Goal status: INITIAL  PLAN:  PT FREQUENCY: 1-2x/week  PT DURATION: 8 weeks  PLANNED INTERVENTIONS: 97164- PT Re-evaluation, 97110-Therapeutic exercises, 97530- Therapeutic activity, 97112- Neuromuscular re-education, 97535- Self Care, [id number]- Manual therapy, [id number]- Gait training, 97014- Electrical stimulation (unattended), Patient/Family education, Dry Needling, Cryotherapy, and Moist heat.  PLAN FOR NEXT SESSION: Review/ update HEP.   Garen Lah, PT, ATRIC Certified Exercise Expert for the Aging Adult  04/14/23 3:17 PM Phone: [phone] Fax: [phone]

## 2023-04-21 ENCOUNTER — Ambulatory Visit: Payer: Managed Care, Other (non HMO) | Admitting: Physical Therapy

## 2023-04-21 ENCOUNTER — Encounter: Payer: Self-pay | Admitting: Physical Therapy

## 2023-04-21 VITALS — BP 164/75 | HR 96

## 2023-04-21 DIAGNOSIS — M6281 Muscle weakness (generalized): Secondary | ICD-10-CM

## 2023-04-21 NOTE — Therapy (Signed)
OUTPATIENT PHYSICAL THERAPY THORACOLUMBAR EVALUATION   Patient Name: Roberta [name] MRN: [id number] DOB:29-Mar-1990, 33 y.o., female Today's Date: 04/21/2023  END OF SESSION:  PT End of Session - 04/21/23 1106     Visit Number 3    Number of Visits 16    Date for PT Re-Evaluation 05/26/23    Authorization Type cigna [id number]    PT Start Time 1104    PT Stop Time 1154    PT Time Calculation (min) 50 min    Activity Tolerance Patient tolerated treatment well;Patient limited by fatigue    Behavior During Therapy [facility] for tasks assessed/performed              Past Medical History:  Diagnosis Date   Anxiety    Depression    Dumping syndrome    Ehlers-Danlos syndrome    Postural orthostatic tachycardia syndrome    Past Surgical History:  Procedure Laterality Date   BIOPSY  11/20/2020   Procedure: BIOPSY;  Surgeon: Bernette Redbird, MD;  Location: [facility];  Service: Endoscopy;;   ESOPHAGOGASTRODUODENOSCOPY N/A 11/20/2020   Procedure: ESOPHAGOGASTRODUODENOSCOPY (EGD);  Surgeon: Bernette Redbird, MD;  Location: [facility];  Service: Endoscopy;  Laterality: N/A;   INNER EAR SURGERY     Right ventriculoateral shunt revision Right 2024   UPPER GI ENDOSCOPY     Patient Active Problem List   Diagnosis Date Noted   Generalized anxiety disorder 07/02/2020   Dry heaves 03/28/2020   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, involving the cardia 03/28/2020   Nausea and vomiting 03/28/2020   Ehlers-Danlos disease 11/22/2019   Chronic eczematous otitis externa of both ears 12/07/2018   Excessive cerumen in both ear canals 12/07/2018   POTS (postural orthostatic tachycardia syndrome) 11/04/2014   Benign essential hypertension 10/10/2014   Lymphadenopathy 02/10/2011    PCP: Merri Brunette, MD  REFERRING PROVIDER:  Fatima Sanger, FNP   REFERRING DIAG: Post op weakness  Rationale for Evaluation and Treatment: Rehabilitation  THERAPY DIAG:  Muscle weakness  (generalized)  ONSET DATE: 02/22/2023  SUBJECTIVE:                                                                                                                                                                                           SUBJECTIVE STATEMENT: "I've been doing exercises, no bouts of dizziness. Work is going good back with the babies (thank goodness)."    EVAL Patient arrives to PT following Right ventriculoateral shunt revision that occurred on 02/22/2023. She was readmitted after she was d/c due excessive N/V eventually D/C 3 days later. Since she was discharged she reports feeling overall fatigued and  weak. She did report falling down a flight of steps (10 steps) on 12/15, but notes she didn't hurt her head oranything else and notes she was just bruised, but didn't mention this to her MD. 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   PERTINENT HISTORY:  See flow  PAIN:  Are you having pain? No  PRECAUTIONS: Fall  RED FLAGS: None   WEIGHT BEARING RESTRICTIONS: No  FALLS:  Has patient fallen in last 6 months? Yes. Number of falls 1  LIVING ENVIRONMENT: Lives with: lives alone Lives in: House/apartment Stairs:  Elevator  Has following equipment at home: None  OCCUPATION: SLP  PLOF: Independent  PATIENT GOALS: Endurance, working without feeling fatigued/ tired, taking the dog for a walk without issues.    OBJECTIVE:  Note: Objective measures were completed at Evaluation unless otherwise noted.  DIAGNOSTIC FINDINGS:  See chart  PATIENT SURVEYS:  FOTO 64%, predicted 67%  COGNITION: Overall cognitive status: Within functional limits for tasks assessed      POSTURE: rounded shoulders, forward head, and flexed trunk   PALPATION: TTP along the R >L  scalenes/ SCM, upper trap/ levator scapulae.   CERVICAL ROM:   Active ROM A/PROM (deg) eval  Flexion 60  Extension 40  Right lateral flexion 56  Left lateral flexion 52  Right rotation 40  Left rotation 20   (Blank rows = not tested)  LOWER EXTREMITY ROM:     Active  Right eval Left eval  Hip flexion [facility]  Hip extension [facility]  Hip abduction [facility]  Hip adduction [facility]  Hip internal rotation [facility]  Hip external rotation [facility]  Knee flexion [facility] WFL  Knee extension [facility] WFL  Ankle dorsiflexion [facility] WFL  Ankle plantarflexion    Ankle inversion    Ankle eversion     (Blank rows = not tested)  LOWER EXTREMITY MMT:    MMT Right eval Left eval  Hip flexion 4 4  Hip extension 3+ 3+  Hip abduction 3+ 3+  Hip adduction 4 4  Hip internal rotation 4- 4-  Hip external rotation 4- 4-  Knee flexion 4- 4  Knee extension 4- 4  Ankle dorsiflexion 4- 4-  Ankle plantarflexion 4 4  Ankle inversion 4 4  Ankle eversion 4 4   (Blank rows = not tested)  UPPER EXTREMITY MMT:  MMT Right eval Left eval  Shoulder flexion 4- 3+  Shoulder extension 4 4  Shoulder abduction 3+ 3+  Shoulder adduction    Shoulder extension    Shoulder internal rotation 4 4  Shoulder external rotation 4- 3+  Middle trapezius 4- 4-  Lower trapezius 4- 4-  Elbow flexion 4- 4-  Elbow extension 4 4  Wrist flexion    Wrist extension    Wrist ulnar deviation    Wrist radial deviation    Wrist pronation    Wrist supination    Grip strength     (Blank rows = not tested)  UPPER EXTREMITY ROM:  Active ROM Right eval Left eval  Shoulder flexion [facility]  Shoulder extension [facility]  Shoulder abduction [facility]  Shoulder adduction [facility] WFL  Shoulder extension [facility]  Shoulder internal rotation    Shoulder external rotation    Elbow flexion  Elbow extension    Wrist flexion    Wrist extension    Wrist ulnar deviation    Wrist radial deviation    Wrist pronation    Wrist  supination     (Blank rows = not tested)   Note: Core strength 3/5 in supine with (with arms extended)  FUNCTIONAL TESTS:  30 seconds chair stand test 7 reps  GAIT: Distance walked: 100 ft to treatment area Assistive device utilized: None Level of assistance: Complete Independence Comments: Flexed trunk, varying stride lengths bil and postural sway noted  TREATMENT [facility] Adult PT Treatment:                                                DATE: 04/21/23 Today's Vitals   04/21/23 1111 04/21/23 1149  BP: (!) 164/97 (!) 164/75  Pulse: 72 96  SpO2: 95% 99%   There is no height or weight on file to calculate BMI.  Therapeutic Exercise: EMOM in supine x 2 rounds Scapular retraction with ER with RTB x 15 Sustained abuction with ceiling punch with RTB around wrist  Marching alternating l/R x 20 Bridge x 15  EMOM in Seated x 2 min Horizontal abduction 1 x 15 Sit to standing  1 x 12 Over head press  1 x 20 (no weight focus no posterior trunk lean)  Manual Therapy: Suboccipital release bil L Upper trap MTPR  Scalene myofascial release on left side of neck UPA on left side of C-3 to C-6 PA mob C-3 to C-6  Self Care: Cervical posture to reduce tightening of the sub-occipitals / cervical paraspinals and reduce rolling the shoulders to reduce over activation of the upper traps.    [facility] Adult PT Treatment:                                                DATE: 04-14-23 BP 134/94,  pulse ox 72   pr 64 Supine bridge test 80.30 sec  Therapeutic Exercise: Bridge 2 x 10 with arms crossed to promote core activation supine transverse abdominis activation 1 x 5 holding 10 seconds SLR 1 x 10 bil Sidelying hip abduction 1 x 10 bil  Tempo squats with 3 pauses on stand to sit without UE support Sit to stand with 10 # DB with RTB around knees to prevent valgus knees in sit to stand 2 x 10 Pulse ox   98%    PR  90  Manual Therapy: Suboccipital release bil Scalene myofascial release on left  side of neck AROM with over pressure in all planes of cervical UPA on left side of C-3 to C-6 PA mob C-3 to C-6  [facility] Adult PT Treatment:                                                DATE: 03/31/2023 Vitals:   04/21/23 1111 04/21/23 1149  BP: (!) 164/97 (!) 164/75  Pulse: 72 96  SpO2: 95% 99%    Therapeutic Exercise: Bridge 2 x 10 with arms crossed to promote core activation SLR 1 x 10 bil Sidelying  hip abduction 1 x 10 bil  Seated transverse abdominis activation 1 x 5 holding 5 seconds Scapular retraction 1 x 10 with RTB  Provided initial HEP                                                                                                       PATIENT EDUCATION:  Education details: evaluation findings, POC, goals, HEP with proper form/ rationale. Person educated: Patient Education method: Explanation, Demonstration, Verbal cues, and Handouts Education comprehension: verbalized understanding  HOME EXERCISE PROGRAM: Access Code: [email or web address] URL: [email or web address] Date: 03/31/2023 Prepared by: Lulu Riding  Exercises - supine bridge w/ brace  - 1 x daily - 7 x weekly - 2 sets - 10 reps - SLR  - 1 x daily - 7 x weekly - 2 sets - 10 reps - 1 hold - Sidelying Hip Abduction  - 1 x daily - 7 x weekly - 2 sets - 10 reps - Seated Transversus Abdominis Bracing  - 1 x daily - 7 x weekly - 2 sets - 10 reps - 5 second hold - Scapular retraction with ER (MONEY)  - 1 x daily - 7 x weekly - 2 sets - 10 reps Added1-23-25 - Seated Gentle Upper Trapezius Stretch  - 1 x daily - 7 x weekly - 1 sets - 3 reps - 30 hold - Gentle Levator Scapulae Stretch  - 1 x daily - 7 x weekly - 3 sets - 3 reps - 30 hold ASSESSMENT:  CLINICAL IMPRESSION: Donita arrives to PT noting no pain, dizziness, or falls since her previous session she does report having a slight headache, and continued posterior cervical tension. Continued STW along the cervical paraspinals and sub-occipitals which  she noted relief of tension. Worked on UE/LE strengtheing via EMOMs in both supine and seated positoing which she required verbal cues to avoid compensation habits including leaning back with reaching over head, or lifting her feet of the floor when she sat down. End of session she noted feel better than when she arrived. BP/ O2 was monitored before and following session and demonstrated improvement with the exception of pulse which increased slight but remained in the higher range of the normal limites compared from pre to post session.     EVAL- Patient is a 33 y.o. F who was seen today for physical therapy evaluation and treatment for General weakness following her Right ventriculoateral shunt revision on 02/22/2023. She demonstrate function ROM with the exception of limited cervical L rotation. She demonstrates gross weakness in both bil UE/LE, seated /standing she demonstrates postural sway.  She reports have 1 fall down a flight of steps noting she only had bruises and didn't hit her head but also didn't notify her MD or neurosurgeon. Vitals were assessed before and after session with BP starting at 141/97 and exhibited improvement at end of session following exercise in both supine and seated  position. Cherisse reports challenges with endurance and fatigue and noted it has been more challenging this week due to return to work full status and had worked 3  consecutive 10 hours shifts. She would benefit from physical therapy to decrease neck stiffness/ tenderness, improve gross UE/LE strength, maximize stability and balance.   OBJECTIVE IMPAIRMENTS: decreased activity tolerance, decreased endurance, decreased ROM, decreased strength, improper body mechanics, and postural dysfunction.   ACTIVITY LIMITATIONS: carrying, lifting, bending, standing, squatting, reach over head, and locomotion level  PARTICIPATION LIMITATIONS: shopping, community activity, and occupation  PERSONAL FACTORS: Behavior pattern,  Past/current experiences, Time since onset of injury/illness/exacerbation, and 1-2 comorbidities: POTS, EDS  are also affecting patient's functional outcome.   REHAB POTENTIAL: Good  CLINICAL DECISION MAKING: Evolving/moderate complexity  EVALUATION COMPLEXITY: Moderate   GOALS: Goals reviewed with patient? Yes  SHORT TERM GOALS: Target date: 04/28/2023  Pt to be IND with initial HEP for therapeutic progression Baseline: Goal status: INITIAL  LONG TERM GOALS: Target date: 05/26/2023  I'm gross UE strength to >/= 4/5 to promote stablity lifting/ carrying activities / tasks Baseline:  Goal status: INITIAL  2.  Improve gross LE strnegth to >/= 4/5 to assist with lifting mechanics and maintaining posture and stability  Baseline:  Goal status: INITIAL  3.  Improve FOTO score to >/= 68% to demo improvement in function Baseline:  Goal status: INITIAL  4.  Pt to be able to work a 10 hour day performing regular work tasks reporting </= min fatigue to demonstrate improvement in endurance. Baseline:  Goal status: INITIAL  5.  Improve core stength to >/= 4/5 to promote trunk stability/ reduce postural sway and maximize safety with work and ADLs Baseline:  Goal status: INITIAL  6.  Pt to be IND with all HEP and will be able to maintain and progress her current LOF IND Baseline:  Goal status: INITIAL  PLAN:  PT FREQUENCY: 1-2x/week  PT DURATION: 8 weeks  PLANNED INTERVENTIONS: 97164- PT Re-evaluation, 97110-Therapeutic exercises, 97530- Therapeutic activity, 97112- Neuromuscular re-education, 97535- Self Care, 88416- Manual therapy, [id number]- Gait training, 97014- Electrical stimulation (unattended), Patient/Family education, Dry Needling, Cryotherapy, and Moist heat.  PLAN FOR NEXT SESSION: Review/ update HEP. Gross strengthening/ endurance training.   Tige Meas PT, DPT, LAT, ATC  04/21/23  12:01 PM

## 2023-04-28 ENCOUNTER — Encounter: Payer: Self-pay | Admitting: Physical Therapy

## 2023-04-28 ENCOUNTER — Other Ambulatory Visit (HOSPITAL_COMMUNITY)
Admission: RE | Admit: 2023-04-28 | Discharge: 2023-04-28 | Disposition: A | Discharge status: Home / Self Care | Payer: Self-pay | Source: Ambulatory Visit | Attending: Medical Genetics | Admitting: Medical Genetics

## 2023-04-28 ENCOUNTER — Ambulatory Visit: Payer: Managed Care, Other (non HMO) | Attending: Registered Nurse | Admitting: Physical Therapy

## 2023-04-28 VITALS — BP 152/92 | HR 94

## 2023-04-28 DIAGNOSIS — M6281 Muscle weakness (generalized): Secondary | ICD-10-CM | POA: Diagnosis present

## 2023-04-28 NOTE — Therapy (Signed)
 OUTPATIENT PHYSICAL THERAPY THORACOLUMBAR EVALUATION   Patient Name: Roberta [name] MRN: [id number] DOB:[birth year], 33 y.o., female Today's Date: 04/28/2023  END OF SESSION:  PT End of Session - 04/28/23 1422     Visit Number 4    Number of Visits 16    Date for PT Re-Evaluation 05/26/23    Authorization Type cigna [id number]    PT Start Time 1419    PT Stop Time 1501    PT Time Calculation (min) 42 min    Activity Tolerance Patient tolerated treatment well;Patient limited by fatigue               Past Medical History:  Diagnosis Date   Anxiety    Depression    Dumping syndrome    Ehlers-Danlos syndrome    Postural orthostatic tachycardia syndrome    Past Surgical History:  Procedure Laterality Date   BIOPSY  11/20/2020   Procedure: BIOPSY;  Surgeon: Donnald Charleston, MD;  Location: [facility];  Service: Endoscopy;;   ESOPHAGOGASTRODUODENOSCOPY N/A 11/20/2020   Procedure: ESOPHAGOGASTRODUODENOSCOPY (EGD);  Surgeon: Donnald Charleston, MD;  Location: [facility];  Service: Endoscopy;  Laterality: N/A;   INNER EAR SURGERY     Right ventriculoateral shunt revision Right 2024   UPPER GI ENDOSCOPY     Patient Active Problem List   Diagnosis Date Noted   Generalized anxiety disorder 07/02/2020   Dry heaves 03/28/2020   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, involving the cardia 03/28/2020   Nausea and vomiting 03/28/2020   Ehlers-Danlos disease 11/22/2019   Chronic eczematous otitis externa of both ears 12/07/2018   Excessive cerumen in both ear canals 12/07/2018   POTS (postural orthostatic tachycardia syndrome) 11/04/2014   Benign essential hypertension 10/10/2014   Lymphadenopathy 02/10/2011    PCP: Clarice Nottingham, MD  REFERRING PROVIDER:  Royden Ronal Czar, FNP   REFERRING DIAG: Post op weakness  Rationale for Evaluation and Treatment: Rehabilitation  THERAPY DIAG:  Muscle weakness (generalized)  ONSET DATE: 02/22/2023  SUBJECTIVE:                                                                                                                                                                                            SUBJECTIVE STATEMENT:  I had a follow up and they said I have a bunch of scar tissue along the side of my head to my neck. I've also had an off an on HA rated at a 4/10 HA at max since the last session.  EVAL Patient arrives to PT following Right ventriculoateral shunt revision that occurred on 02/22/2023. She was readmitted after she was d/c due excessive N/V  eventually D/C 3 days later. Since she was discharged she reports feeling overall fatigued and weak. She did report falling down a flight of steps (10 steps) on 12/15, but notes she didn't hurt her head oranything else and notes she was just bruised, but didn't mention this to her MD. 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   PERTINENT HISTORY:  See flow  PAIN:  Are you having pain? No  PRECAUTIONS: Fall  RED FLAGS: None   WEIGHT BEARING RESTRICTIONS: No  FALLS:  Has patient fallen in last 6 months? Yes. Number of falls 1  LIVING ENVIRONMENT: Lives with: lives alone Lives in: House/apartment Stairs:  Elevator  Has following equipment at home: None  OCCUPATION: SLP  PLOF: Independent  PATIENT GOALS: Endurance, working without feeling fatigued/ tired, taking the dog for a walk without issues.    OBJECTIVE:  Note: Objective measures were completed at Evaluation unless otherwise noted.  DIAGNOSTIC FINDINGS:  See chart  PATIENT SURVEYS:  FOTO 64%, predicted 67%  COGNITION: Overall cognitive status: Within functional limits for tasks assessed      POSTURE: rounded shoulders, forward head, and flexed trunk   PALPATION: TTP along the R >L scalenes/  SCM, upper trap/ levator scapulae.   CERVICAL ROM:   Active ROM A/PROM (deg) eval  Flexion 60  Extension 40  Right lateral flexion 56  Left lateral flexion 52  Right rotation 40  Left rotation 20   (Blank rows = not tested)  LOWER EXTREMITY ROM:     Active  Right eval Left eval  Hip flexion [facility]  Hip extension [facility]  Hip abduction [facility]  Hip adduction [facility]  Hip internal rotation [facility]  Hip external rotation [facility]  Knee flexion [facility] WFL  Knee extension [facility] WFL  Ankle dorsiflexion [facility] WFL  Ankle plantarflexion    Ankle inversion    Ankle eversion     (Blank rows = not tested)  LOWER EXTREMITY MMT:    MMT Right eval Left eval  Hip flexion 4 4  Hip extension 3+ 3+  Hip abduction 3+ 3+  Hip adduction 4 4  Hip internal rotation 4- 4-  Hip external rotation 4- 4-  Knee flexion 4- 4  Knee extension 4- 4  Ankle dorsiflexion 4- 4-  Ankle plantarflexion 4 4  Ankle inversion 4 4  Ankle eversion 4 4   (Blank rows = not tested)  UPPER EXTREMITY MMT:  MMT Right eval Left eval  Shoulder flexion 4- 3+  Shoulder extension 4 4  Shoulder abduction 3+ 3+  Shoulder adduction    Shoulder extension    Shoulder internal rotation 4 4  Shoulder external rotation 4- 3+  Middle trapezius 4- 4-  Lower trapezius 4- 4-  Elbow flexion 4- 4-  Elbow extension 4 4  Wrist flexion    Wrist extension    Wrist ulnar deviation    Wrist radial deviation    Wrist pronation    Wrist supination    Grip strength     (Blank rows = not tested)  UPPER EXTREMITY ROM:  Active ROM Right eval Left eval  Shoulder flexion [facility]  Shoulder extension [facility]  Shoulder abduction [facility]  Shoulder adduction [facility]  Shoulder extension [facility]  Shoulder internal rotation    Shoulder external rotation    Elbow flexion    Elbow extension    Wrist flexion    Wrist extension    Wrist ulnar deviation    Wrist radial deviation    Wrist pronation    Wrist supination      (Blank rows = not tested)   Note: Core strength 3/5 in supine with (with arms extended)  FUNCTIONAL TESTS:  30 seconds chair stand test 7 reps  GAIT: Distance walked: 100 ft to treatment area Assistive device utilized: None Level of assistance: Complete Independence Comments: Flexed trunk, varying stride lengths bil and postural sway noted  TREATMENT [facility] Adult PT Treatment:                                                DATE: 04/28/23 Today's Vitals   04/28/23 1422 04/28/23 1428 04/28/23 1504  BP: (!) 166/108 (!) 160/108 (!) 152/92  Pulse: 91  94  SpO2: 99%  96%   Recumbent bike L 3 x 5 min   MTPR along the R SCM / scalenes  Seated Emom x 2 rounds  Overhead press 1 x 20 with no leaning back Seated marching alternating  avoding leaning back without UE with GTB  Sit to stand with GTB around knees 1 x 20   Standing Emom x 2 rounds Standing Row x 15 with GTB Standing UE ball press x 20  Lateral steps over x 4 hurdles x 5 bil    [facility] Adult PT Treatment:                                                DATE: 04/21/23 Today's Vitals    04/21/23 1111 04/21/23 1149  BP: (!) 164/97 (!) 164/75  Pulse: 72 96  SpO2: 95% 99%   Therapeutic Exercise: EMOM in supine x 2 rounds Scapular retraction with ER with RTB x 15 Sustained abuction with ceiling punch with RTB around wrist  Marching alternating l/R x 20 Bridge x 15  EMOM in Seated x 2 min Horizontal abduction 1 x 15 Sit to standing  1 x 12 Over head press  1 x 20 (no weight focus no posterior trunk lean)  Manual Therapy: Suboccipital release bil L Upper trap MTPR  Scalene myofascial release on left side of neck UPA on left side of C-3 to C-6 PA mob C-3 to C-6  Self Care: Cervical posture to reduce tightening of the sub-occipitals / cervical paraspinals and reduce rolling the shoulders to reduce over activation of the upper traps.    [facility] Adult PT Treatment:                                                DATE:  04-14-23 BP 134/94,  pulse ox 72   pr 64 Supine bridge test 80.30 sec  Therapeutic Exercise: Bridge 2 x 10 with arms crossed to promote core activation supine transverse abdominis activation 1 x 5 holding 10 seconds SLR 1 x 10 bil Sidelying hip abduction 1 x 10 bil  Tempo squats with  3 pauses on stand to sit without UE support Sit to stand with 10 # DB with RTB around knees to prevent valgus knees in sit to stand 2 x 10 Pulse ox   98%    PR  90  Manual Therapy: Suboccipital release bil Scalene myofascial release on left side of neck AROM with over pressure in all planes of cervical UPA on left side of C-3 to C-6 PA mob C-3 to C-6                                                          PATIENT EDUCATION:  Education details: evaluation findings, POC, goals, HEP with proper form/ rationale. Person educated: Patient Education method: Explanation, Demonstration, Verbal cues, and Handouts Education comprehension: verbalized understanding  HOME EXERCISE PROGRAM: Access Code: [email or web address] URL: [email or web address] Date: 03/31/2023 Prepared by: Joneen Fresh  Exercises - supine bridge w/ brace  - 1 x daily - 7 x weekly - 2 sets - 10 reps - SLR  - 1 x daily - 7 x weekly - 2 sets - 10 reps - 1 hold - Sidelying Hip Abduction  - 1 x daily - 7 x weekly - 2 sets - 10 reps - Seated Transversus Abdominis Bracing  - 1 x daily - 7 x weekly - 2 sets - 10 reps - 5 second hold - Scapular retraction with ER (MONEY)  - 1 x daily - 7 x weekly - 2 sets - 10 reps Added1-23-25 - Seated Gentle Upper Trapezius Stretch  - 1 x daily - 7 x weekly - 1 sets - 3 reps - 30 hold - Gentle Levator Scapulae Stretch  - 1 x daily - 7 x weekly - 3 sets - 3 reps - 30 hold ASSESSMENT:  CLINICAL IMPRESSION: 04/28/2023 Sarh arrived to PT today noting last session went well, continued working on endurance utilizing compound movements via EMOMs in both seated and standing positions requiring cues to minimize  postural compensation via leaning back. Monitored vitals which they improved from beginning to end of session.     EVAL- Patient is a 33 y.o. F who was seen today for physical therapy evaluation and treatment for General weakness following her Right ventriculoateral shunt revision on 02/22/2023. She demonstrate function ROM with the exception of limited cervical L rotation. She demonstrates gross weakness in both bil UE/LE, seated /standing she demonstrates postural sway.  She reports have 1 fall down a flight of steps noting she only had bruises and didn't hit her head but also didn't notify her MD or neurosurgeon. Vitals were assessed before and after session with BP starting at 141/97 and exhibited improvement at end of session following exercise in both supine and seated  position. Lanett reports challenges with endurance and fatigue and noted it has been more challenging this week due to return to work full status and had worked 3 consecutive 10 hours shifts. She would benefit from physical therapy to decrease neck stiffness/ tenderness, improve gross UE/LE strength, maximize stability and balance.   OBJECTIVE IMPAIRMENTS: decreased activity tolerance, decreased endurance, decreased ROM, decreased strength, improper body mechanics, and postural dysfunction.   ACTIVITY LIMITATIONS: carrying, lifting, bending, standing, squatting, reach over head, and locomotion level  PARTICIPATION LIMITATIONS: shopping, community activity, and occupation  PERSONAL FACTORS: Behavior pattern, Past/current experiences, Time since  onset of injury/illness/exacerbation, and 1-2 comorbidities: POTS, EDS  are also affecting patient's functional outcome.   REHAB POTENTIAL: Good  CLINICAL DECISION MAKING: Evolving/moderate complexity  EVALUATION COMPLEXITY: Moderate   GOALS: Goals reviewed with patient? Yes  SHORT TERM GOALS: Target date: 04/28/2023  Pt to be IND with initial HEP for therapeutic  progression Baseline: Goal status: INITIAL  LONG TERM GOALS: Target date: 05/26/2023  I'm gross UE strength to >/= 4/5 to promote stablity lifting/ carrying activities / tasks Baseline:  Goal status: INITIAL  2.  Improve gross LE strnegth to >/= 4/5 to assist with lifting mechanics and maintaining posture and stability  Baseline:  Goal status: INITIAL  3.  Improve FOTO score to >/= 68% to demo improvement in function Baseline:  Goal status: INITIAL  4.  Pt to be able to work a 10 hour day performing regular work tasks reporting </= min fatigue to demonstrate improvement in endurance. Baseline:  Goal status: INITIAL  5.  Improve core stength to >/= 4/5 to promote trunk stability/ reduce postural sway and maximize safety with work and ADLs Baseline:  Goal status: INITIAL  6.  Pt to be IND with all HEP and will be able to maintain and progress her current LOF IND Baseline:  Goal status: INITIAL  PLAN:  PT FREQUENCY: 1-2x/week  PT DURATION: 8 weeks  PLANNED INTERVENTIONS: 97164- PT Re-evaluation, 97110-Therapeutic exercises, 97530- Therapeutic activity, 97112- Neuromuscular re-education, 97535- Self Care, [id number]- Manual therapy, [id number]- Gait training, 97014- Electrical stimulation (unattended), Patient/Family education, Dry Needling, Cryotherapy, and Moist heat.  PLAN FOR NEXT SESSION: Review/ update HEP. Gross strengthening/ endurance training. Continued EMOMs progress with balance training per pt request, core activation.   Hobson Lax PT, DPT, LAT, ATC  04/28/23  3:28 PM

## 2023-05-05 ENCOUNTER — Encounter: Payer: Self-pay | Admitting: Physical Therapy

## 2023-05-05 ENCOUNTER — Ambulatory Visit: Payer: Managed Care, Other (non HMO) | Admitting: Physical Therapy

## 2023-05-05 VITALS — BP 146/83 | HR 93

## 2023-05-05 DIAGNOSIS — M6281 Muscle weakness (generalized): Secondary | ICD-10-CM | POA: Diagnosis not present

## 2023-05-05 NOTE — Therapy (Signed)
OUTPATIENT PHYSICAL THERAPY THORACOLUMBAR EVALUATION   Patient Name: Roberta [name] MRN: [id number] DOB:[birth year], 33 y.o., female Today's Date: 05/05/2023  END OF SESSION:  PT End of Session - 05/05/23 1517     Visit Number 5    Number of Visits 16    Date for PT Re-Evaluation 05/26/23    Authorization Type cigna [id number]    PT Start Time 1417    PT Stop Time 1504    PT Time Calculation (min) 47 min    Activity Tolerance Patient tolerated treatment well;Patient limited by fatigue    Behavior During Therapy [facility] for tasks assessed/performed                Past Medical History:  Diagnosis Date   Anxiety    Depression    Dumping syndrome    Ehlers-Danlos syndrome    Postural orthostatic tachycardia syndrome    Past Surgical History:  Procedure Laterality Date   BIOPSY  11/20/2020   Procedure: BIOPSY;  Surgeon: Bernette Redbird, MD;  Location: [facility];  Service: Endoscopy;;   ESOPHAGOGASTRODUODENOSCOPY N/A 11/20/2020   Procedure: ESOPHAGOGASTRODUODENOSCOPY (EGD);  Surgeon: Bernette Redbird, MD;  Location: [facility];  Service: Endoscopy;  Laterality: N/A;   INNER EAR SURGERY     Right ventriculoateral shunt revision Right 2024   UPPER GI ENDOSCOPY     Patient Active Problem List   Diagnosis Date Noted   Generalized anxiety disorder 07/02/2020   Dry heaves 03/28/2020   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, involving the cardia 03/28/2020   Nausea and vomiting 03/28/2020   Ehlers-Danlos disease 11/22/2019   Chronic eczematous otitis externa of both ears 12/07/2018   Excessive cerumen in both ear canals 12/07/2018   POTS (postural orthostatic tachycardia syndrome) 11/04/2014   Benign essential hypertension 10/10/2014   Lymphadenopathy 02/10/2011    PCP: Merri Brunette, MD  REFERRING PROVIDER:  Fatima Sanger, FNP   REFERRING DIAG: Post op weakness  Rationale for Evaluation and Treatment: Rehabilitation  THERAPY DIAG:  Muscle weakness  (generalized)  ONSET DATE: 02/22/2023  SUBJECTIVE:                                                                                                                                                                                           SUBJECTIVE STATEMENT: 05/05/2023 "No falls, still headaches but they are trying to work on the BP but it does make my BP drop so they are working on that too. Headache today is a 3/10"  EVAL Patient arrives to PT following Right ventriculoateral shunt revision that occurred on 02/22/2023. She was readmitted after she was d/c due excessive N/V  eventually D/C 3 days later. Since she was discharged she reports feeling overall fatigued and weak. She did report falling down a flight of steps (10 steps) on 12/15, but notes she didn't hurt her head oranything else and notes she was just bruised, but didn't mention this to her MD. 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   PERTINENT HISTORY:  See flow  PAIN:  Are you having pain? No  PRECAUTIONS: Fall  RED FLAGS: None   WEIGHT BEARING RESTRICTIONS: No  FALLS:  Has patient fallen in last 6 months? Yes. Number of falls 1  LIVING ENVIRONMENT: Lives with: lives alone Lives in: House/apartment Stairs:  Elevator  Has following equipment at home: None  OCCUPATION: SLP  PLOF: Independent  PATIENT GOALS: Endurance, working without feeling fatigued/ tired, taking the dog for a walk without issues.    OBJECTIVE:  Note: Objective measures were completed at Evaluation unless otherwise noted.  DIAGNOSTIC FINDINGS:  See chart  PATIENT SURVEYS:  FOTO 64%, predicted 67%  COGNITION: Overall cognitive status: Within functional limits for tasks assessed      POSTURE: rounded shoulders, forward head, and flexed  trunk   PALPATION: TTP along the R >L scalenes/ SCM, upper trap/ levator scapulae.   CERVICAL ROM:   Active ROM A/PROM (deg) eval  Flexion 60  Extension 40  Right lateral flexion 56  Left lateral flexion 52  Right rotation 40  Left rotation 20   (Blank rows = not tested)  LOWER EXTREMITY ROM:     Active  Right eval Left eval  Hip flexion [facility]  Hip extension [facility]  Hip abduction [facility]  Hip adduction [facility]  Hip internal rotation [facility]  Hip external rotation [facility]  Knee flexion [facility] WFL  Knee extension [facility] WFL  Ankle dorsiflexion [facility] WFL  Ankle plantarflexion    Ankle inversion    Ankle eversion     (Blank rows = not tested)  LOWER EXTREMITY MMT:    MMT Right eval Left eval  Hip flexion 4 4  Hip extension 3+ 3+  Hip abduction 3+ 3+  Hip adduction 4 4  Hip internal rotation 4- 4-  Hip external rotation 4- 4-  Knee flexion 4- 4  Knee extension 4- 4  Ankle dorsiflexion 4- 4-  Ankle plantarflexion 4 4  Ankle inversion 4 4  Ankle eversion 4 4   (Blank rows = not tested)  UPPER EXTREMITY MMT:  MMT Right eval Left eval  Shoulder flexion 4- 3+  Shoulder extension 4 4  Shoulder abduction 3+ 3+  Shoulder adduction    Shoulder extension    Shoulder internal rotation 4 4  Shoulder external rotation 4- 3+  Middle trapezius 4- 4-  Lower trapezius 4- 4-  Elbow flexion 4- 4-  Elbow extension 4 4  Wrist flexion    Wrist extension    Wrist ulnar deviation    Wrist radial deviation    Wrist pronation    Wrist supination    Grip strength     (Blank rows = not tested)  UPPER EXTREMITY ROM:  Active ROM Right eval Left eval  Shoulder flexion [facility]  Shoulder extension [facility]  Shoulder abduction [facility]  Shoulder adduction [facility]  Shoulder extension [facility]  Shoulder internal rotation    Shoulder external rotation    Elbow flexion    Elbow extension    Wrist flexion    Wrist extension    Wrist ulnar deviation    Wrist radial  deviation    Wrist pronation    Wrist supination     (Blank rows = not tested)   Note: Core strength 3/5 in supine with (with arms extended)  FUNCTIONAL TESTS:  30 seconds chair stand test 7 reps  GAIT: Distance walked: 100 ft to treatment area Assistive device utilized: None Level of assistance: Complete Independence Comments: Flexed trunk, varying stride lengths bil and postural sway noted  TREATMENT [facility] Adult PT Treatment:                                                DATE: 05/05/23 Today's Vitals   05/05/23 1423 05/05/23 1459  BP: (!) 159/96 (!) 146/83  Pulse: 77 93   Recumbent bike L4 x 5 min target intensity  EMOM x 2 rounds  Sit to stand x 15 Pilates ring bow and arrow bil 1 x 10  Step up on bosu alternating L/R x 20  Squat and reach with red physioball x 10  Marching in place on airex pad x 20 Red physioball ball press downs seated x 20    [facility] Adult PT Treatment:                                                DATE: 04/28/23 Today's Vitals   04/28/23 1422 04/28/23 1428 04/28/23 1504  BP: (!) 166/108 (!) 160/108 (!) 152/92  Pulse: 91  94  SpO2: 99%  96%   Recumbent bike L 3 x 5 min   MTPR along the R SCM / scalenes  Seated Emom x 2 rounds  Overhead press 1 x 20 with no leaning back Seated marching alternating  avoding leaning back without UE with GTB  Sit to stand with GTB around knees 1 x 20   Standing Emom x 2 rounds Standing Row x 15 with GTB Standing UE ball press x 20  Lateral steps over x 4 hurdles x 5 bil    [facility] Adult PT Treatment:                                                DATE: 04/21/23 Today's Vitals    04/21/23 1111 04/21/23 1149  BP: (!) 164/97 (!) 164/75  Pulse: 72 96  SpO2: 95% 99%   Therapeutic Exercise: EMOM in supine x 2 rounds Scapular retraction with ER with RTB x 15 Sustained abuction with ceiling punch with RTB around wrist  Marching alternating l/R x 20 Bridge x 15  EMOM in Seated x 2 min Horizontal abduction 1 x  15 Sit to standing  1 x 12 Over head press  1 x 20 (no weight focus no posterior trunk lean)  Manual Therapy: Suboccipital release bil L Upper trap MTPR  Scalene myofascial release on left side of neck UPA on left side of C-3 to C-6 PA mob C-3 to  C-6  Self Care: Cervical posture to reduce tightening of the sub-occipitals / cervical paraspinals and reduce rolling the shoulders to reduce over activation of the upper traps.            PATIENT EDUCATION:  Education details: evaluation findings, POC, goals, HEP with proper form/ rationale. Person educated: Patient Education method: Explanation, Demonstration, Verbal cues, and Handouts Education comprehension: verbalized understanding  HOME EXERCISE PROGRAM: Access Code: [email or web address] URL: [email or web address] Date: 05/05/2023 Prepared by: Lulu Riding  Exercises - supine bridge w/ brace  - 1 x daily - 7 x weekly - 2 sets - 10 reps - SLR  - 1 x daily - 7 x weekly - 2 sets - 10 reps - 1 hold - Sidelying Hip Abduction  - 1 x daily - 7 x weekly - 2 sets - 10 reps - Seated Transversus Abdominis Bracing  - 1 x daily - 7 x weekly - 2 sets - 10 reps - 5 second hold - Scapular retraction with ER (MONEY)  - 1 x daily - 7 x weekly - 2 sets - 10 reps - Seated Gentle Upper Trapezius Stretch  - 1 x daily - 7 x weekly - 1 sets - 3 reps - 30 hold - Gentle Levator Scapulae Stretch  - 1 x daily - 7 x weekly - 3 sets - 3 reps - 30 hold - Standing Shoulder Row with Anchored Resistance  - 1 x daily - 7 x weekly - 2 sets - 10 reps - Seated Abdominal Press into [employer]  - 1 x daily - 7 x weekly - 2 sets - 10 reps - 3-5 seconds hold  ASSESSMENT:  CLINICAL IMPRESSION: 05/05/2023 Roberta [name] arrives to session noting she has continued to be consistent with her HEP and has been monitoring her BP at home since her last visit and has been on a new BP medication which has been helping her BP. Continued working on gross strength and endurance via  emoms as incorporating balance as well which she did well with but required verbal cues to keep her balance in front to be able to control it better vs farther posterior due to increase potential fall risk.     EVAL- Patient is a 33 y.o. F who was seen today for physical therapy evaluation and treatment for General weakness following her Right ventriculoateral shunt revision on 02/22/2023. She demonstrate function ROM with the exception of limited cervical L rotation. She demonstrates gross weakness in both bil UE/LE, seated /standing she demonstrates postural sway.  She reports have 1 fall down a flight of steps noting she only had bruises and didn't hit her head but also didn't notify her MD or neurosurgeon. Vitals were assessed before and after session with BP starting at 141/97 and exhibited improvement at end of session following exercise in both supine and seated  position. Roberta [name] reports challenges with endurance and fatigue and noted it has been more challenging this week due to return to work full status and had worked 3 consecutive 10 hours shifts. She would benefit from physical therapy to decrease neck stiffness/ tenderness, improve gross UE/LE strength, maximize stability and balance.   OBJECTIVE IMPAIRMENTS: decreased activity tolerance, decreased endurance, decreased ROM, decreased strength, improper body mechanics, and postural dysfunction.   ACTIVITY LIMITATIONS: carrying, lifting, bending, standing, squatting, reach over head, and locomotion level  PARTICIPATION LIMITATIONS: shopping, community activity, and occupation  PERSONAL FACTORS: Behavior pattern, Past/current experiences, Time since onset of injury/illness/exacerbation, and 1-2 comorbidities: POTS, EDS  are also affecting patient's functional outcome.   REHAB POTENTIAL: Good  CLINICAL DECISION MAKING: Evolving/moderate complexity  EVALUATION COMPLEXITY: Moderate   GOALS: Goals reviewed with patient? Yes  SHORT TERM  GOALS: Target date: 04/28/2023  Pt to be IND with initial HEP for therapeutic progression Baseline: Goal status: INITIAL  LONG TERM GOALS: Target date: 05/26/2023  I'm gross UE strength to >/= 4/5 to promote stablity lifting/ carrying activities / tasks Baseline:  Goal status: INITIAL  2.  Improve gross LE strnegth to >/= 4/5 to assist with lifting mechanics and maintaining posture and stability  Baseline:  Goal status: INITIAL  3.  Improve FOTO score to >/= 68% to demo improvement in function Baseline:  Goal status: INITIAL  4.  Pt to be able to work a 10 hour day performing regular work tasks reporting </= min fatigue to demonstrate improvement in endurance. Baseline:  Goal status: INITIAL  5.  Improve core stength to >/= 4/5 to promote trunk stability/ reduce postural sway and maximize safety with work and ADLs Baseline:  Goal status: INITIAL  6.  Pt to be IND with all HEP and will be able to maintain and progress her current LOF IND Baseline:  Goal status: INITIAL  PLAN:  PT FREQUENCY: 1-2x/week  PT DURATION: 8 weeks  PLANNED INTERVENTIONS: 97164- PT Re-evaluation, 97110-Therapeutic exercises, 97530- Therapeutic activity, 97112- Neuromuscular re-education, 97535- Self Care, [id number]- Manual therapy, [id number]- Gait training, 97014- Electrical stimulation (unattended), Patient/Family education, Dry Needling, Cryotherapy, and Moist heat.  PLAN FOR NEXT SESSION: Review/ update HEP. Gross strengthening/ endurance training. Continued EMOMs progress with balance training per pt request, core activation.   Shanay Woolman PT, DPT, LAT, ATC  05/05/23  3:20 PM

## 2023-05-12 ENCOUNTER — Encounter: Payer: Self-pay | Admitting: Physical Therapy

## 2023-05-12 ENCOUNTER — Ambulatory Visit: Payer: Managed Care, Other (non HMO) | Admitting: Physical Therapy

## 2023-05-12 VITALS — BP 145/105 | HR 98

## 2023-05-12 DIAGNOSIS — M6281 Muscle weakness (generalized): Secondary | ICD-10-CM | POA: Diagnosis not present

## 2023-05-12 NOTE — Therapy (Signed)
OUTPATIENT PHYSICAL THERAPY THORACOLUMBAR TREATMENT   Patient Name: Roberta [name] MRN: [id number] DOB:Aug 13, 1990, 33 y.o., female Today's Date: 05/12/2023  END OF SESSION:  PT End of Session - 05/12/23 1323     Visit Number 6    Number of Visits 16    Date for PT Re-Evaluation 05/26/23    Authorization Type cigna [id number]    PT Start Time 1326                Past Medical History:  Diagnosis Date   Anxiety    Depression    Dumping syndrome    Ehlers-Danlos syndrome    Postural orthostatic tachycardia syndrome    Past Surgical History:  Procedure Laterality Date   BIOPSY  11/20/2020   Procedure: BIOPSY;  Surgeon: Bernette Redbird, MD;  Location: [facility];  Service: Endoscopy;;   ESOPHAGOGASTRODUODENOSCOPY N/A 11/20/2020   Procedure: ESOPHAGOGASTRODUODENOSCOPY (EGD);  Surgeon: Bernette Redbird, MD;  Location: [facility];  Service: Endoscopy;  Laterality: N/A;   INNER EAR SURGERY     Right ventriculoateral shunt revision Right 2024   UPPER GI ENDOSCOPY     Patient Active Problem List   Diagnosis Date Noted   Generalized anxiety disorder 07/02/2020   Dry heaves 03/28/2020   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, involving the cardia 03/28/2020   Nausea and vomiting 03/28/2020   Ehlers-Danlos disease 11/22/2019   Chronic eczematous otitis externa of both ears 12/07/2018   Excessive cerumen in both ear canals 12/07/2018   POTS (postural orthostatic tachycardia syndrome) 11/04/2014   Benign essential hypertension 10/10/2014   Lymphadenopathy 02/10/2011    PCP: Merri Brunette, MD  REFERRING PROVIDER:  Fatima Sanger, FNP   REFERRING DIAG: Post op weakness  Rationale for Evaluation and Treatment: Rehabilitation  THERAPY DIAG:  Muscle weakness (generalized)  ONSET DATE: 02/22/2023  SUBJECTIVE:                                                                                                                                                                                            SUBJECTIVE STATEMENT: 05/12/2023 "I fell yesterday in the parking of the hospital, I fell backward but end up on my left side. It was due to the ice in the parking lot. I didn't hit my head but did end up with a headache which lasted until I went to bed, which I didn't have before. I didn't monitor my vitals after, but I didn't contact any to let them know I fell."  EVAL Patient arrives to PT following Right ventriculoateral shunt revision that occurred on 02/22/2023. She was readmitted after she was d/c due excessive N/V  eventually D/C 3 days later. Since she was discharged she reports feeling overall fatigued and weak. She did report falling down a flight of steps (10 steps) on 12/15, but notes she didn't hurt her head oranything else and notes she was just bruised, but didn't mention this to her MD. 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   PERTINENT HISTORY:  See flow  PAIN:  Are you having pain? No  PRECAUTIONS: Fall  RED FLAGS: None   WEIGHT BEARING RESTRICTIONS: No  FALLS:  Has patient fallen in last 6 months? Yes. Number of falls 1  LIVING ENVIRONMENT: Lives with: lives alone Lives in: House/apartment Stairs:  Elevator  Has following equipment at home: None  OCCUPATION: SLP  PLOF: Independent  PATIENT GOALS: Endurance, working without feeling fatigued/ tired, taking the dog for a walk without issues.    OBJECTIVE:  Note: Objective measures were completed at Evaluation unless otherwise noted.  DIAGNOSTIC FINDINGS:  See chart  PATIENT SURVEYS:  FOTO 64%, predicted 67%  COGNITION: Overall cognitive status: Within functional limits for tasks assessed      POSTURE: rounded shoulders, forward head, and flexed trunk   PALPATION: TTP  along the R >L scalenes/ SCM, upper trap/ levator scapulae.   CERVICAL ROM:   Active ROM A/PROM (deg) eval  Flexion 60  Extension 40  Right lateral flexion 56  Left lateral flexion 52  Right rotation 40  Left rotation 20   (Blank rows = not tested)  LOWER EXTREMITY ROM:     Active  Right eval Left eval  Hip flexion [facility]  Hip extension [facility]  Hip abduction [facility]  Hip adduction [facility]  Hip internal rotation [facility]  Hip external rotation [facility]  Knee flexion [facility]  Knee extension [facility] WFL  Ankle dorsiflexion [facility] WFL  Ankle plantarflexion    Ankle inversion    Ankle eversion     (Blank rows = not tested)  LOWER EXTREMITY MMT:    MMT Right eval Left eval  Hip flexion 4 4  Hip extension 3+ 3+  Hip abduction 3+ 3+  Hip adduction 4 4  Hip internal rotation 4- 4-  Hip external rotation 4- 4-  Knee flexion 4- 4  Knee extension 4- 4  Ankle dorsiflexion 4- 4-  Ankle plantarflexion 4 4  Ankle inversion 4 4  Ankle eversion 4 4   (Blank rows = not tested)  UPPER EXTREMITY MMT:  MMT Right eval Left eval  Shoulder flexion 4- 3+  Shoulder extension 4 4  Shoulder abduction 3+ 3+  Shoulder adduction    Shoulder extension    Shoulder internal rotation 4 4  Shoulder external rotation 4- 3+  Middle trapezius 4- 4-  Lower trapezius 4- 4-  Elbow flexion 4- 4-  Elbow extension 4 4  Wrist flexion    Wrist extension    Wrist ulnar deviation    Wrist radial deviation    Wrist pronation    Wrist supination    Grip strength     (Blank rows = not tested)  UPPER EXTREMITY ROM:  Active ROM Right eval Left eval  Shoulder flexion [facility]  Shoulder extension [facility]  Shoulder abduction [facility]  Shoulder adduction [facility]  Shoulder extension [facility]  Shoulder internal rotation    Shoulder external rotation    Elbow flexion    Elbow extension    Wrist flexion    Wrist extension    Wrist ulnar deviation    Wrist radial deviation    Wrist pronation     Wrist supination     (Blank rows = not tested)   Note: Core strength 3/5 in supine with (with arms extended)  FUNCTIONAL TESTS:  30 seconds chair stand test 7 reps  GAIT: Distance walked: 100 ft to treatment area Assistive device utilized: None Level of assistance: Complete Independence Comments: Flexed trunk, varying stride lengths bil and postural sway noted  TREATMENT [facility] Adult PT Treatment:                                                DATE: 05/12/23 Vitals:   05/12/23 1338 05/12/23 1409  BP: (!) 144/96 (!) 145/105  Pulse: 88 98  SpO2: 97% 97%   EMOM x 2 round Step ups x 15 Sit to stands from table controled 15 Overhead press seated on dyna with 1# x 10 Standing ball toss against wall on airx pad x 1:00 Seated dyna disc Pilates ring bow and arrow bil 1 x 10   Seated balance with pertubations on unstable surfaces  Discussed importance of following up with her MD especially after a fall, even if she didn' thit her head simple jarring motion can cause problems    [facility] Adult PT Treatment:                                                DATE: 05/05/23 Today's Vitals   05/05/23 1423 05/05/23 1459  BP: (!) 159/96 (!) 146/83  Pulse: 77 93   Recumbent bike L4 x 5 min target intensity  EMOM x 2 rounds  Sit to stand x 15 Pilates ring bow and arrow bil 1 x 10  Step up on bosu alternating L/R x 20  Squat and reach with red physioball x 10  Marching in place on airex pad x 20 Red physioball ball press downs seated x 20    [facility] Adult PT Treatment:                                                DATE: 04/28/23 Today's Vitals   04/28/23 1422 04/28/23 1428 04/28/23 1504  BP: (!) 166/108 (!) 160/108 (!) 152/92  Pulse: 91  94  SpO2: 99%  96%   Recumbent bike L 3 x 5 min   MTPR along the R SCM / scalenes  Seated Emom x 2 rounds  Overhead press 1 x 20 with no leaning back Seated marching alternating  avoding leaning back without UE with GTB  Sit to stand with GTB around  knees 1 x 20   Standing Emom x 2 rounds Standing Row x 15 with GTB Standing UE ball press x 20  Lateral steps over x 4 hurdles x 5 bil    [facility] Adult PT Treatment:  DATE: 04/21/23 Today's Vitals    04/21/23 1111 04/21/23 1149  BP: (!) 164/97 (!) 164/75  Pulse: 72 96  SpO2: 95% 99%   Therapeutic Exercise: EMOM in supine x 2 rounds Scapular retraction with ER with RTB x 15 Sustained abuction with ceiling punch with RTB around wrist  Marching alternating l/R x 20 Bridge x 15  EMOM in Seated x 2 min Horizontal abduction 1 x 15 Sit to standing  1 x 12 Over head press  1 x 20 (no weight focus no posterior trunk lean)  Manual Therapy: Suboccipital release bil L Upper trap MTPR  Scalene myofascial release on left side of neck UPA on left side of C-3 to C-6 PA mob C-3 to C-6  Self Care: Cervical posture to reduce tightening of the sub-occipitals / cervical paraspinals and reduce rolling the shoulders to reduce over activation of the upper traps.            PATIENT EDUCATION:  Education details: evaluation findings, POC, goals, HEP with proper form/ rationale. Person educated: Patient Education method: Explanation, Demonstration, Verbal cues, and Handouts Education comprehension: verbalized understanding  HOME EXERCISE PROGRAM: Access Code: [email or web address] URL: [email or web address] Date: 05/05/2023 Prepared by: Lulu Riding  Exercises - supine bridge w/ brace  - 1 x daily - 7 x weekly - 2 sets - 10 reps - SLR  - 1 x daily - 7 x weekly - 2 sets - 10 reps - 1 hold - Sidelying Hip Abduction  - 1 x daily - 7 x weekly - 2 sets - 10 reps - Seated Transversus Abdominis Bracing  - 1 x daily - 7 x weekly - 2 sets - 10 reps - 5 second hold - Scapular retraction with ER (MONEY)  - 1 x daily - 7 x weekly - 2 sets - 10 reps - Seated Gentle Upper Trapezius Stretch  - 1 x daily - 7 x weekly - 1 sets - 3 reps - 30 hold - Gentle  Levator Scapulae Stretch  - 1 x daily - 7 x weekly - 3 sets - 3 reps - 30 hold - Standing Shoulder Row with Anchored Resistance  - 1 x daily - 7 x weekly - 2 sets - 10 reps - Seated Abdominal Press into [employer]  - 1 x daily - 7 x weekly - 2 sets - 10 reps - 3-5 seconds hold  ASSESSMENT:  CLINICAL IMPRESSION: 05/12/2023 Continued working on functional  strengthening for both UE and LE with balance training in both seated and standing positions utilizing unstable surfaces to assist with reactions. She does require intermittent cues to avoid posterior trunk lean but is making improvements. End of session she noted feeling better compared to when she arrived.     EVAL- Patient is a 33 y.o. F who was seen today for physical therapy evaluation and treatment for General weakness following her Right ventriculoateral shunt revision on 02/22/2023. She demonstrate function ROM with the exception of limited cervical L rotation. She demonstrates gross weakness in both bil UE/LE, seated /standing she demonstrates postural sway.  She reports have 1 fall down a flight of steps noting she only had bruises and didn't hit her head but also didn't notify her MD or neurosurgeon. Vitals were assessed before and after session with BP starting at 141/97 and exhibited improvement at end of session following exercise in both supine and seated  position. Casy reports challenges with endurance and fatigue and noted it has been more challenging this week  due to return to work full status and had worked 3 consecutive 10 hours shifts. She would benefit from physical therapy to decrease neck stiffness/ tenderness, improve gross UE/LE strength, maximize stability and balance.   OBJECTIVE IMPAIRMENTS: decreased activity tolerance, decreased endurance, decreased ROM, decreased strength, improper body mechanics, and postural dysfunction.   ACTIVITY LIMITATIONS: carrying, lifting, bending, standing, squatting, reach over head, and  locomotion level  PARTICIPATION LIMITATIONS: shopping, community activity, and occupation  PERSONAL FACTORS: Behavior pattern, Past/current experiences, Time since onset of injury/illness/exacerbation, and 1-2 comorbidities: POTS, EDS  are also affecting patient's functional outcome.   REHAB POTENTIAL: Good  CLINICAL DECISION MAKING: Evolving/moderate complexity  EVALUATION COMPLEXITY: Moderate   GOALS: Goals reviewed with patient? Yes  SHORT TERM GOALS: Target date: 04/28/2023  Pt to be IND with initial HEP for therapeutic progression Baseline: Goal status: INITIAL  LONG TERM GOALS: Target date: 05/26/2023  I'm gross UE strength to >/= 4/5 to promote stablity lifting/ carrying activities / tasks Baseline:  Goal status: INITIAL  2.  Improve gross LE strnegth to >/= 4/5 to assist with lifting mechanics and maintaining posture and stability  Baseline:  Goal status: INITIAL  3.  Improve FOTO score to >/= 68% to demo improvement in function Baseline:  Goal status: INITIAL  4.  Pt to be able to work a 10 hour day performing regular work tasks reporting </= min fatigue to demonstrate improvement in endurance. Baseline:  Goal status: INITIAL  5.  Improve core stength to >/= 4/5 to promote trunk stability/ reduce postural sway and maximize safety with work and ADLs Baseline:  Goal status: INITIAL  6.  Pt to be IND with all HEP and will be able to maintain and progress her current LOF IND Baseline:  Goal status: INITIAL  PLAN:  PT FREQUENCY: 1-2x/week  PT DURATION: 8 weeks  PLANNED INTERVENTIONS: 97164- PT Re-evaluation, 97110-Therapeutic exercises, 97530- Therapeutic activity, 97112- Neuromuscular re-education, 97535- Self Care, 13086- Manual therapy, [id number]- Gait training, 97014- Electrical stimulation (unattended), Patient/Family education, Dry Needling, Cryotherapy, and Moist heat.  PLAN FOR NEXT SESSION: Review/ update HEP. Gross strengthening/ endurance training.  Continued EMOMs progress with balance training per pt request, core activation.   Arvilla Salada PT, DPT, LAT, ATC  05/12/23  2:31 PM

## 2023-05-13 LAB — GENECONNECT MOLECULAR SCREEN: Genetic Analysis Overall Interpretation: NEGATIVE

## 2023-05-19 ENCOUNTER — Ambulatory Visit: Payer: Managed Care, Other (non HMO) | Admitting: Physical Therapy

## 2023-05-19 VITALS — BP 138/92 | HR 97

## 2023-05-19 DIAGNOSIS — M6281 Muscle weakness (generalized): Secondary | ICD-10-CM | POA: Diagnosis not present

## 2023-05-19 NOTE — Therapy (Signed)
 OUTPATIENT PHYSICAL THERAPY THORACOLUMBAR TREATMENT   Patient Name: BRITTANEY BEAULIEU MRN: [id number] DOB:1990/09/30, 33 y.o., female Today's Date: 05/19/2023  END OF SESSION:  PT End of Session - 05/19/23 0931     Visit Number 7    Number of Visits 16    Date for PT Re-Evaluation 05/26/23    Authorization Type cigna [id number]    PT Start Time [phone]    Activity Tolerance Patient tolerated treatment well;Patient limited by fatigue    Behavior During Therapy [facility] for tasks assessed/performed                Past Medical History:  Diagnosis Date   Anxiety    Depression    Dumping syndrome    Ehlers-Danlos syndrome    Postural orthostatic tachycardia syndrome    Past Surgical History:  Procedure Laterality Date   BIOPSY  11/20/2020   Procedure: BIOPSY;  Surgeon: Bernette Redbird, MD;  Location: [facility];  Service: Endoscopy;;   ESOPHAGOGASTRODUODENOSCOPY N/A 11/20/2020   Procedure: ESOPHAGOGASTRODUODENOSCOPY (EGD);  Surgeon: Bernette Redbird, MD;  Location: [facility];  Service: Endoscopy;  Laterality: N/A;   INNER EAR SURGERY     Right ventriculoateral shunt revision Right 2024   UPPER GI ENDOSCOPY     Patient Active Problem List   Diagnosis Date Noted   Generalized anxiety disorder 07/02/2020   Dry heaves 03/28/2020   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, involving the cardia 03/28/2020   Nausea and vomiting 03/28/2020   Ehlers-Danlos disease 11/22/2019   Chronic eczematous otitis externa of both ears 12/07/2018   Excessive cerumen in both ear canals 12/07/2018   POTS (postural orthostatic tachycardia syndrome) 11/04/2014   Benign essential hypertension 10/10/2014   Lymphadenopathy 02/10/2011    PCP: Merri Brunette, MD  REFERRING PROVIDER:  Fatima Sanger, FNP   REFERRING DIAG: Post op weakness  Rationale for Evaluation and Treatment: Rehabilitation  THERAPY DIAG:  Muscle weakness (generalized)  ONSET DATE: 02/22/2023  SUBJECTIVE:                                                                                                                                                                                            SUBJECTIVE STATEMENT: 05/19/2023 "doing pretty good, the meds aren't working cause the BP is still high so they gave me some to take before PT, headache better, turning the head at end ranges is sore equally."  EVAL Patient arrives to PT following Right ventriculoateral shunt revision that occurred on 02/22/2023. She was readmitted after she was d/c due excessive N/V eventually D/C 3 days later. Since she was discharged she reports feeling overall fatigued and  weak. She did report falling down a flight of steps (10 steps) on 12/15, but notes she didn't hurt her head oranything else and notes she was just bruised, but didn't mention this to her MD. 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   PERTINENT HISTORY:  See flow  PAIN:  Are you having pain? No  PRECAUTIONS: Fall  RED FLAGS: None   WEIGHT BEARING RESTRICTIONS: No  FALLS:  Has patient fallen in last 6 months? Yes. Number of falls 1  LIVING ENVIRONMENT: Lives with: lives alone Lives in: House/apartment Stairs:  Elevator  Has following equipment at home: None  OCCUPATION: SLP  PLOF: Independent  PATIENT GOALS: Endurance, working without feeling fatigued/ tired, taking the dog for a walk without issues.    OBJECTIVE:  Note: Objective measures were completed at Evaluation unless otherwise noted.  DIAGNOSTIC FINDINGS:  See chart  PATIENT SURVEYS:  FOTO 64%, predicted 67%  COGNITION: Overall cognitive status: Within functional limits for tasks assessed      POSTURE: rounded shoulders, forward head, and flexed trunk   PALPATION: TTP along the R >L  scalenes/ SCM, upper trap/ levator scapulae.   CERVICAL ROM:   Active ROM A/PROM (deg) eval  Flexion 60  Extension 40  Right lateral flexion 56  Left lateral flexion 52  Right rotation 40  Left rotation 20   (Blank rows = not tested)  LOWER EXTREMITY ROM:     Active  Right eval Left eval  Hip flexion [facility]  Hip extension [facility]  Hip abduction [facility]  Hip adduction [facility]  Hip internal rotation [facility]  Hip external rotation [facility]  Knee flexion [facility]  Knee extension [facility]  Ankle dorsiflexion [facility]  Ankle plantarflexion    Ankle inversion    Ankle eversion     (Blank rows = not tested)  LOWER EXTREMITY MMT:    MMT Right eval Left eval  Hip flexion 4 4  Hip extension 3+ 3+  Hip abduction 3+ 3+  Hip adduction 4 4  Hip internal rotation 4- 4-  Hip external rotation 4- 4-  Knee flexion 4- 4  Knee extension 4- 4  Ankle dorsiflexion 4- 4-  Ankle plantarflexion 4 4  Ankle inversion 4 4  Ankle eversion 4 4   (Blank rows = not tested)  UPPER EXTREMITY MMT:  MMT Right eval Left eval  Shoulder flexion 4- 3+  Shoulder extension 4 4  Shoulder abduction 3+ 3+  Shoulder adduction    Shoulder extension    Shoulder internal rotation 4 4  Shoulder external rotation 4- 3+  Middle trapezius 4- 4-  Lower trapezius 4- 4-  Elbow flexion 4- 4-  Elbow extension 4 4  Wrist flexion    Wrist extension    Wrist ulnar deviation    Wrist radial deviation    Wrist pronation    Wrist supination    Grip strength     (Blank rows = not tested)  UPPER EXTREMITY ROM:  Active ROM Right eval Left eval  Shoulder flexion [facility]  Shoulder extension [facility]  Shoulder abduction [facility]  Shoulder adduction [facility]  Shoulder extension [facility]  Shoulder internal rotation    Shoulder external rotation    Elbow flexion  Elbow extension    Wrist flexion    Wrist extension    Wrist ulnar deviation    Wrist radial deviation    Wrist pronation    Wrist  supination     (Blank rows = not tested)   Note: Core strength 3/5 in supine with (with arms extended)  FUNCTIONAL TESTS:  30 seconds chair stand test 7 reps  GAIT: Distance walked: 100 ft to treatment area Assistive device utilized: None Level of assistance: Complete Independence Comments: Flexed trunk, varying stride lengths bil and postural sway noted  TREATMENT [facility] Adult PT Treatment:                                                DATE: 05/19/23 Vitals:   05/19/23 0935 05/19/23 1013  BP: (!) 146/92 (!) 138/92  Pulse: 83 97  SpO2: 97% (!) 77%    MTPR along the R upper trap/ levator and cervical paraspinal UBE L1 x 4 min (FWD/ BWD x 2 min ea) VOR1 & VOR2 rhomberg x 20 ea (cues for slow head movements). Repeated in modified tandemo Palloff press 1 x 10 bil 10# using freemtion - verbal cues on posture/ form Worked posture during exams using soft lock in knees vs hyper extension to promote muscle control   [facility] Adult PT Treatment:                                                DATE: 05/12/23 Vitals:   05/12/23 1338 05/12/23 1409  BP: (!) 144/96 (!) 145/105  Pulse: 88 98  SpO2: 97% 97%   EMOM x 2 round Step ups x 15 Sit to stands from table controled 15 Overhead press seated on dyna with 1# x 10 Standing ball toss against wall on airx pad x 1:00 Seated dyna disc Pilates ring bow and arrow bil 1 x 10   Seated balance with pertubations on unstable surfaces  Discussed importance of following up with her MD especially after a fall, even if she didn' thit her head simple jarring motion can cause problems    [facility] Adult PT Treatment:                                                DATE: 05/05/23 Today's Vitals   05/05/23 1423 05/05/23 1459  BP: (!) 159/96 (!) 146/83  Pulse: 77 93   Recumbent bike L4 x 5 min target intensity  EMOM x 2 rounds  Sit to stand x 15 Pilates ring bow and arrow bil 1 x 10  Step up on bosu alternating L/R x 20  Squat and reach with red  physioball x 10  Marching in place on airex pad x 20 Red physioball ball press downs seated x 20    PATIENT EDUCATION:  Education details: evaluation findings, POC, goals, HEP with proper form/ rationale. Person educated: Patient Education method: Explanation, Demonstration, Verbal cues, and Handouts Education comprehension: verbalized understanding  HOME EXERCISE PROGRAM: Access Code: [email or web address] URL: [email or web address] Date: 05/05/2023 Prepared by: Lulu Riding  Exercises - supine bridge w/ brace  - 1  x daily - 7 x weekly - 2 sets - 10 reps - SLR  - 1 x daily - 7 x weekly - 2 sets - 10 reps - 1 hold - Sidelying Hip Abduction  - 1 x daily - 7 x weekly - 2 sets - 10 reps - Seated Transversus Abdominis Bracing  - 1 x daily - 7 x weekly - 2 sets - 10 reps - 5 second hold - Scapular retraction with ER (MONEY)  - 1 x daily - 7 x weekly - 2 sets - 10 reps - Seated Gentle Upper Trapezius Stretch  - 1 x daily - 7 x weekly - 1 sets - 3 reps - 30 hold - Gentle Levator Scapulae Stretch  - 1 x daily - 7 x weekly - 3 sets - 3 reps - 30 hold - Standing Shoulder Row with Anchored Resistance  - 1 x daily - 7 x weekly - 2 sets - 10 reps - Seated Abdominal Press into [employer]  - 1 x daily - 7 x weekly - 2 sets - 10 reps - 3-5 seconds hold  ASSESSMENT:  CLINICAL IMPRESSION: 05/19/2023 Namya continues to make steady progress with physical therapy and is improving with her compensation habits in regard to leaning back or on items. Continued STW along the R shoulder muscle tightness and how it can be done at home.  Discussed posture during exams to maximize posture and prevent leaning and compensation habits of locking/ hyper extending knees and keeping weight on balls of feet. Worked a good deal on balance and head head turns due to pt report challenges with that during work related tasks. End of session she reported feeling better.     EVAL- Patient is a 33 y.o. F who was seen  today for physical therapy evaluation and treatment for General weakness following her Right ventriculoateral shunt revision on 02/22/2023. She demonstrate function ROM with the exception of limited cervical L rotation. She demonstrates gross weakness in both bil UE/LE, seated /standing she demonstrates postural sway.  She reports have 1 fall down a flight of steps noting she only had bruises and didn't hit her head but also didn't notify her MD or neurosurgeon. Vitals were assessed before and after session with BP starting at 141/97 and exhibited improvement at end of session following exercise in both supine and seated  position. Aimar reports challenges with endurance and fatigue and noted it has been more challenging this week due to return to work full status and had worked 3 consecutive 10 hours shifts. She would benefit from physical therapy to decrease neck stiffness/ tenderness, improve gross UE/LE strength, maximize stability and balance.   OBJECTIVE IMPAIRMENTS: decreased activity tolerance, decreased endurance, decreased ROM, decreased strength, improper body mechanics, and postural dysfunction.   ACTIVITY LIMITATIONS: carrying, lifting, bending, standing, squatting, reach over head, and locomotion level  PARTICIPATION LIMITATIONS: shopping, community activity, and occupation  PERSONAL FACTORS: Behavior pattern, Past/current experiences, Time since onset of injury/illness/exacerbation, and 1-2 comorbidities: POTS, EDS  are also affecting patient's functional outcome.   REHAB POTENTIAL: Good  CLINICAL DECISION MAKING: Evolving/moderate complexity  EVALUATION COMPLEXITY: Moderate   GOALS: Goals reviewed with patient? Yes  SHORT TERM GOALS: Target date: 04/28/2023  Pt to be IND with initial HEP for therapeutic progression Baseline: Goal status: MET 05/19/23  LONG TERM GOALS: Target date: 05/26/2023  I'm gross UE strength to >/= 4/5 to promote stablity lifting/ carrying activities /  tasks Baseline:  Goal status: INITIAL  2.  Improve gross LE strnegth to >/= 4/5 to assist with lifting mechanics and maintaining posture and stability  Baseline:  Goal status: INITIAL  3.  Improve FOTO score to >/= 68% to demo improvement in function Baseline:  Goal status: INITIAL  4.  Pt to be able to work a 10 hour day performing regular work tasks reporting </= min fatigue to demonstrate improvement in endurance. Baseline:  Goal status: INITIAL  5.  Improve core stength to >/= 4/5 to promote trunk stability/ reduce postural sway and maximize safety with work and ADLs Baseline:  Goal status: INITIAL  6.  Pt to be IND with all HEP and will be able to maintain and progress her current LOF IND Baseline:  Goal status: INITIAL  PLAN:  PT FREQUENCY: 1-2x/week  PT DURATION: 8 weeks  PLANNED INTERVENTIONS: 97164- PT Re-evaluation, 97110-Therapeutic exercises, 97530- Therapeutic activity, 97112- Neuromuscular re-education, 97535- Self Care, [id number]- Manual therapy, [id number]- Gait training, 97014- Electrical stimulation (unattended), Patient/Family education, Dry Needling, Cryotherapy, and Moist heat.  PLAN FOR NEXT SESSION: Review/ update HEP. Gross strengthening/ endurance training. Continued EMOMs progress with balance training per pt request, core activation. Gave goal performing swallow study at work without relying on leaning on the bed for over 1/2 of the session for support (out of necessity due to  imbalance and fatigue).  Mirren Gest PT, DPT, LAT, ATC  05/19/23  10:23 AM

## 2023-05-26 ENCOUNTER — Encounter: Payer: Self-pay | Admitting: Physical Therapy

## 2023-05-26 ENCOUNTER — Ambulatory Visit: Payer: Managed Care, Other (non HMO) | Attending: Registered Nurse | Admitting: Physical Therapy

## 2023-05-26 VITALS — BP 137/95 | HR 77

## 2023-05-26 DIAGNOSIS — M6281 Muscle weakness (generalized): Secondary | ICD-10-CM | POA: Diagnosis present

## 2023-05-26 NOTE — Therapy (Signed)
 OUTPATIENT PHYSICAL THERAPY THORACOLUMBAR TREATMENT / Re-certification   Patient Name: MAJEL GIEL MRN: [id number] DOB:September 11, 1990, 33 y.o., female Today's Date: 05/26/2023  END OF SESSION:  PT End of Session - 05/26/23 1334     Visit Number 8    Number of Visits 16    Date for PT Re-Evaluation 07/21/23    Authorization Type cigna [id number]    PT Start Time 1333    PT Stop Time 1418    PT Time Calculation (min) 45 min    Activity Tolerance Patient tolerated treatment well;Patient limited by fatigue    Behavior During Therapy [facility] for tasks assessed/performed                 Past Medical History:  Diagnosis Date   Anxiety    Depression    Dumping syndrome    Ehlers-Danlos syndrome    Postural orthostatic tachycardia syndrome    Past Surgical History:  Procedure Laterality Date   BIOPSY  11/20/2020   Procedure: BIOPSY;  Surgeon: Bernette Redbird, MD;  Location: [facility];  Service: Endoscopy;;   ESOPHAGOGASTRODUODENOSCOPY N/A 11/20/2020   Procedure: ESOPHAGOGASTRODUODENOSCOPY (EGD);  Surgeon: Bernette Redbird, MD;  Location: [facility];  Service: Endoscopy;  Laterality: N/A;   INNER EAR SURGERY     Right ventriculoateral shunt revision Right 2024   UPPER GI ENDOSCOPY     Patient Active Problem List   Diagnosis Date Noted   Generalized anxiety disorder 07/02/2020   Dry heaves 03/28/2020   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, involving the cardia 03/28/2020   Nausea and vomiting 03/28/2020   Ehlers-Danlos disease 11/22/2019   Chronic eczematous otitis externa of both ears 12/07/2018   Excessive cerumen in both ear canals 12/07/2018   POTS (postural orthostatic tachycardia syndrome) 11/04/2014   Benign essential hypertension 10/10/2014   Lymphadenopathy 02/10/2011    PCP: Merri Brunette, MD  REFERRING PROVIDER:  Fatima Sanger, FNP   REFERRING DIAG: Post op weakness  Rationale for Evaluation and Treatment: Rehabilitation  THERAPY DIAG:   Muscle weakness (generalized)  ONSET DATE: 02/22/2023  SUBJECTIVE:                                                                                                                                                                                           SUBJECTIVE STATEMENT: 05/26/2023 " No falls since the last session, and with work I was able to make it about 2 mins into every swallow study before I had to use the bed to lean on."   EVAL Patient arrives to PT following Right ventriculoateral shunt revision that occurred on 02/22/2023. She was readmitted after she  was d/c due excessive N/V eventually D/C 3 days later. Since she was discharged she reports feeling overall fatigued and weak. She did report falling down a flight of steps (10 steps) on 12/15, but notes she didn't hurt her head oranything else and notes she was just bruised, but didn't mention this to her MD. 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   PERTINENT HISTORY:  See flow  PAIN:  Are you having pain? No  PRECAUTIONS: Fall  RED FLAGS: None   WEIGHT BEARING RESTRICTIONS: No  FALLS:  Has patient fallen in last 6 months? Yes. Number of falls 1  LIVING ENVIRONMENT: Lives with: lives alone Lives in: House/apartment Stairs:  Elevator  Has following equipment at home: None  OCCUPATION: SLP  PLOF: Independent  PATIENT GOALS: Endurance, working without feeling fatigued/ tired, taking the dog for a walk without issues.  To be able to stand for 30 min without leaning or feeling fatigued following a swallow exam.    OBJECTIVE:  Note: Objective measures were completed at Evaluation unless otherwise noted.  DIAGNOSTIC FINDINGS:  See chart  PATIENT SURVEYS:  FOTO 64%, predicted 67% 05/26/23 49% (pt noted she didn't  answer the intake questions honestly)  COGNITION: Overall cognitive status: Within functional limits for tasks assessed      POSTURE: rounded shoulders, forward head, and flexed trunk   PALPATION: TTP along the R >L scalenes/ SCM, upper trap/ levator scapulae.   CERVICAL ROM:   Active ROM A/PROM (deg) eval  Flexion 60  Extension 40  Right lateral flexion 56  Left lateral flexion 52  Right rotation 40  Left rotation 20   (Blank rows = not tested)  LOWER EXTREMITY ROM:     Active  Right eval Left eval  Hip flexion [facility]  Hip extension [facility]  Hip abduction [facility]  Hip adduction [facility]  Hip internal rotation [facility]  Hip external rotation [facility]  Knee flexion [facility]  Knee extension [facility]  Ankle dorsiflexion [facility]  Ankle plantarflexion    Ankle inversion    Ankle eversion     (Blank rows = not tested)  LOWER EXTREMITY MMT:    MMT Right eval Left eval Right 05/26/23 Left 05/26/23  Hip flexion 4 4 4+ 4  Hip extension 3+ 3+ 4- 4-  Hip abduction 3+ 3+ 4- 4-  Hip adduction 4 4 4 4   Hip internal rotation 4- 4- 4+ 4+  Hip external rotation 4- 4- 4+ 4+  Knee flexion 4- 4 4 4   Knee extension 4- 4 4 4   Ankle dorsiflexion 4- 4- 4+ 4+  Ankle plantarflexion 4 4    Ankle inversion 4 4    Ankle eversion 4 4     (Blank rows = not tested)  UPPER EXTREMITY MMT:  MMT Right eval Left eval Right 05/26/23 Left 05/26/23  Shoulder flexion 4- 3+ 4 4-  Shoulder extension 4 4 4+ 4+  Shoulder abduction 3+ 3+ 4- 4-  Shoulder adduction      Shoulder extension      Shoulder internal rotation 4 4    Shoulder external rotation 4- 3+ 4- 3+  Middle trapezius 4- 4- 4 4  Lower trapezius 4- 4- 4 4  Elbow flexion 4- 4- 4-  4-  Elbow extension 4 4 4 4   Wrist flexion      Wrist extension      Wrist ulnar deviation      Wrist radial deviation      Wrist pronation      Wrist supination      Grip strength       (Blank rows = not tested)  UPPER EXTREMITY ROM:  Active ROM  Right eval Left eval  Shoulder flexion [facility]  Shoulder extension [facility]  Shoulder abduction [facility]  Shoulder adduction [facility]  Shoulder extension [facility]  Shoulder internal rotation    Shoulder external rotation    Elbow flexion    Elbow extension    Wrist flexion    Wrist extension    Wrist ulnar deviation    Wrist radial deviation    Wrist pronation    Wrist supination     (Blank rows = not tested)   Note: Core strength 3/5 in supine with (with arms extended)  05/26/23: Core strength 3+/5  FUNCTIONAL TESTS:  30 seconds chair stand test 7 reps  GAIT: Distance walked: 100 ft to treatment area Assistive device utilized: None Level of assistance: Complete Independence Comments: Flexed trunk, varying stride lengths bil and postural sway noted  TREATMENT [facility] Adult PT Treatment:                                                DATE: 05/26/23 Vitals:   05/26/23 1336 05/26/23 1415  BP: (!) 148/90 (!) 137/95  Pulse: 95 77  SpO2: 96% 98%   Nu-step L8 x 6 min UE/LE Standing tricep extension 2 x 10 10# freemtoion Step ups on 8inch step Standing balance on bosu 4 x 1 min ea. Verbal cues on ways to maintain balanc.  Reviewed MMT, goals and functional improvements compared to evaluation   [facility] Adult PT Treatment:                                                DATE: 05/19/23 Vitals:   05/19/23 0935 05/19/23 1013  BP: (!) 146/92 (!) 138/92  Pulse: 83 97  SpO2: 97% (!) 77%    MTPR along the R upper trap/ levator and cervical paraspinal UBE L1 x 4 min (FWD/ BWD x 2 min ea) VOR1 & VOR2 rhomberg x 20 ea (cues for slow head movements). Repeated in modified tandemo Palloff press 1 x 10 bil 10# using freemtion - verbal cues on posture/ form Worked posture during exams using soft lock in knees vs hyper extension to promote muscle control   [facility] Adult PT Treatment:                                                DATE: 05/12/23 Vitals:   05/12/23 1338 05/12/23 1409  BP: (!)  144/96 (!) 145/105  Pulse: 88 98  SpO2: 97% 97%   EMOM x 2 round Step ups x 15 Sit to stands from table controled 15 Overhead press seated on dyna with 1# x 10 Standing ball toss against wall  on airx pad x 1:00 Seated dyna disc Pilates ring bow and arrow bil 1 x 10   Seated balance with pertubations on unstable surfaces  Discussed importance of following up with her MD especially after a fall, even if she didn' thit her head simple jarring motion can cause problems    PATIENT EDUCATION:  Education details: evaluation findings, POC, goals, HEP with proper form/ rationale. Person educated: Patient Education method: Explanation, Demonstration, Verbal cues, and Handouts Education comprehension: verbalized understanding  HOME EXERCISE PROGRAM: Access Code: [email or web address] URL: [email or web address] Date: 05/26/2023 Prepared by: Lulu Riding  Exercises - supine bridge w/ brace  - 1 x daily - 7 x weekly - 2 sets - 10 reps - SLR  - 1 x daily - 7 x weekly - 2 sets - 10 reps - 1 hold - Sidelying Hip Abduction  - 1 x daily - 7 x weekly - 2 sets - 10 reps - Seated Transversus Abdominis Bracing  - 1 x daily - 7 x weekly - 2 sets - 10 reps - 5 second hold - Scapular retraction with ER (MONEY)  - 1 x daily - 7 x weekly - 2 sets - 10 reps - Seated Gentle Upper Trapezius Stretch  - 1 x daily - 7 x weekly - 1 sets - 3 reps - 30 hold - Gentle Levator Scapulae Stretch  - 1 x daily - 7 x weekly - 3 sets - 3 reps - 30 hold - Standing Shoulder Row with Anchored Resistance  - 1 x daily - 7 x weekly - 2 sets - 10 reps - Seated Abdominal Press into [employer]  - 1 x daily - 7 x weekly - 2 sets - 10 reps - 3-5 seconds hold - Elbow Extension with Resistance  - 1 x daily - 7 x weekly - 2 sets - 12 reps  ASSESSMENT:  CLINICAL IMPRESSION: 05/26/2023 [place] is making good progress with physical therapy increasing both UE/ LE and core strength and is improving with her postural control/ awareness  with reduction in leaning/ sway. She does continued to demonstrate limitations with endurance and fatigues quickly and does rely on compensation strategies when this occurs. Continue to work on both UE/LE strengthening as well as both static and dynamic balance which she is doing better with keeping her weight forward vs backward where she is able to control is better. She is making good progress toward her goals and would benefit from continued physical therapy to work on strengthening, endurance training, postural control/ strategies to reduce compensation and maximize her function and safety by addressing the deficits listed.     EVAL- Patient is a 33 y.o. F who was seen today for physical therapy evaluation and treatment for General weakness following her Right ventriculoateral shunt revision on 02/22/2023. She demonstrate function ROM with the exception of limited cervical L rotation. She demonstrates gross weakness in both bil UE/LE, seated /standing she demonstrates postural sway.  She reports have 1 fall down a flight of steps noting she only had bruises and didn't hit her head but also didn't notify her MD or neurosurgeon. Vitals were assessed before and after session with BP starting at 141/97 and exhibited improvement at end of session following exercise in both supine and seated  position. Kiasha reports challenges with endurance and fatigue and noted it has been more challenging this week due to return to work full status and had worked 3 consecutive 10 hours shifts. She would benefit from physical therapy  to decrease neck stiffness/ tenderness, improve gross UE/LE strength, maximize stability and balance.   OBJECTIVE IMPAIRMENTS: decreased activity tolerance, decreased endurance, decreased ROM, decreased strength, improper body mechanics, and postural dysfunction.   ACTIVITY LIMITATIONS: carrying, lifting, bending, standing, squatting, reach over head, and locomotion level  PARTICIPATION  LIMITATIONS: shopping, community activity, and occupation  PERSONAL FACTORS: Behavior pattern, Past/current experiences, Time since onset of injury/illness/exacerbation, and 1-2 comorbidities: POTS, EDS  are also affecting patient's functional outcome.   REHAB POTENTIAL: Good  CLINICAL DECISION MAKING: Evolving/moderate complexity  EVALUATION COMPLEXITY: Moderate   GOALS: Goals reviewed with patient? Yes  SHORT TERM GOALS: Target date: 04/28/2023  Pt to be IND with initial HEP for therapeutic progression Baseline: Goal status: MET 05/19/23  LONG TERM GOALS: Target date: 07/21/2023  I'm gross UE strength to >/= 4/5 to promote stablity lifting/ carrying activities / tasks Baseline:  Goal status: Ongoing 05/26/2023  2.  Improve gross LE strnegth to >/= 4/5 to assist with lifting mechanics and maintaining posture and stability  Baseline:  Goal status: Ongoing 05/26/2023  3.  Improve FOTO score to >/= 68% to demo improvement in function Baseline:  Goal status: Ongoing 05/26/2023  4.  Pt to be able to work a 10 hour day performing regular work tasks reporting </= min fatigue to demonstrate improvement in endurance. Baseline:  Status reports: moderate Goal status: Ongoing 05/26/2023  5.  Improve core stength to >/= 4/5 to promote trunk stability/ reduce postural sway and maximize safety with work and ADLs Baseline:  Goal status: Ongoing 05/26/23  6.  Pt to be IND with all HEP and will be able to maintain and progress her current LOF IND Baseline:  Goal status: Ongoing 05/26/23  7. Pt to be able stand for the entire session >/= 30 minutes and not need to rely on leaning without feeling off balance or unstable during work related tasks as well as personal ADLs per her personal goal .  Baseline:  Goal status: INITIAL PLAN:  PT FREQUENCY: 1-2x/week  PT DURATION: 8 weeks  PLANNED INTERVENTIONS: 97164- PT Re-evaluation, 97110-Therapeutic exercises, 97530- Therapeutic activity, 97112-  Neuromuscular re-education, 97535- Self Care, 82956- Manual therapy, 97116- Gait training, 97014- Electrical stimulation (unattended), Patient/Family education, Dry Needling, Cryotherapy, and Moist heat.  PLAN FOR NEXT SESSION: Review/ update HEP. Gross strengthening/ endurance training. Continued EMOMs progress with balance training per pt request, core activation. Gave goal performing swallow study at work without relying on leaning on the bed for over for 2:30 - 3:00.  Alixis Harmon PT, DPT, LAT, ATC  05/26/23  3:14 PM

## 2023-06-02 ENCOUNTER — Encounter: Payer: Self-pay | Admitting: Physical Therapy

## 2023-06-02 ENCOUNTER — Ambulatory Visit: Payer: Managed Care, Other (non HMO) | Admitting: Physical Therapy

## 2023-06-02 VITALS — BP 162/93 | HR 93

## 2023-06-02 DIAGNOSIS — M6281 Muscle weakness (generalized): Secondary | ICD-10-CM | POA: Diagnosis not present

## 2023-06-02 NOTE — Therapy (Signed)
 OUTPATIENT PHYSICAL THERAPY THORACOLUMBAR TREATMENT    Patient Name: KALEYAH LABRECK MRN: [id number] DOB:May 22, 1990, 33 y.o., female Today's Date: 06/02/2023  END OF SESSION:  PT End of Session - 06/02/23 1413     Visit Number 9    Number of Visits 16    Date for PT Re-Evaluation 07/21/23    Authorization Type cigna [id number]    PT Start Time 1413    PT Stop Time 1501    PT Time Calculation (min) 48 min    Activity Tolerance Patient tolerated treatment well;Patient limited by fatigue    Behavior During Therapy [facility] for tasks assessed/performed                  Past Medical History:  Diagnosis Date   Anxiety    Depression    Dumping syndrome    Ehlers-Danlos syndrome    Postural orthostatic tachycardia syndrome    Past Surgical History:  Procedure Laterality Date   BIOPSY  11/20/2020   Procedure: BIOPSY;  Surgeon: Bernette Redbird, MD;  Location: [facility];  Service: Endoscopy;;   ESOPHAGOGASTRODUODENOSCOPY N/A 11/20/2020   Procedure: ESOPHAGOGASTRODUODENOSCOPY (EGD);  Surgeon: Bernette Redbird, MD;  Location: [facility];  Service: Endoscopy;  Laterality: N/A;   INNER EAR SURGERY     Right ventriculoateral shunt revision Right 2024   UPPER GI ENDOSCOPY     Patient Active Problem List   Diagnosis Date Noted   Generalized anxiety disorder 07/02/2020   Dry heaves 03/28/2020   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, involving the cardia 03/28/2020   Nausea and vomiting 03/28/2020   Ehlers-Danlos disease 11/22/2019   Chronic eczematous otitis externa of both ears 12/07/2018   Excessive cerumen in both ear canals 12/07/2018   POTS (postural orthostatic tachycardia syndrome) 11/04/2014   Benign essential hypertension 10/10/2014   Lymphadenopathy 02/10/2011    PCP: Merri Brunette, MD  REFERRING PROVIDER:  Fatima Sanger, FNP   REFERRING DIAG: Post op weakness  Rationale for Evaluation and Treatment: Rehabilitation  THERAPY DIAG:  Muscle weakness  (generalized)  ONSET DATE: 02/22/2023  SUBJECTIVE:                                                                                                                                                                                           SUBJECTIVE STATEMENT: 06/02/2023 "No falls for a couple weeks and still working not leaning, which I am up to about 1 min. Did cardiac stress today which I made 8 min."  EVAL Patient arrives to PT following Right ventriculoateral shunt revision that occurred on 02/22/2023. She was readmitted after she was d/c due excessive N/V eventually  D/C 3 days later. Since she was discharged she reports feeling overall fatigued and weak. She did report falling down a flight of steps (10 steps) on 12/15, but notes she didn't hurt her head oranything else and notes she was just bruised, but didn't mention this to her MD. 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   PERTINENT HISTORY:  See flow  PAIN:  Are you having pain? No  PRECAUTIONS: Fall  RED FLAGS: None   WEIGHT BEARING RESTRICTIONS: No  FALLS:  Has patient fallen in last 6 months? Yes. Number of falls 1  LIVING ENVIRONMENT: Lives with: lives alone Lives in: House/apartment Stairs:  Elevator  Has following equipment at home: None  OCCUPATION: SLP  PLOF: Independent  PATIENT GOALS: Endurance, working without feeling fatigued/ tired, taking the dog for a walk without issues.  To be able to stand for 30 min without leaning or feeling fatigued following a swallow exam.    OBJECTIVE:  Note: Objective measures were completed at Evaluation unless otherwise noted.  DIAGNOSTIC FINDINGS:  See chart  PATIENT SURVEYS:  FOTO 64%, predicted 67% 05/26/23 49% (pt noted she didn't answer the intake questions  honestly)  COGNITION: Overall cognitive status: Within functional limits for tasks assessed      POSTURE: rounded shoulders, forward head, and flexed trunk   PALPATION: TTP along the R >L scalenes/ SCM, upper trap/ levator scapulae.   CERVICAL ROM:   Active ROM A/PROM (deg) eval  Flexion 60  Extension 40  Right lateral flexion 56  Left lateral flexion 52  Right rotation 40  Left rotation 20   (Blank rows = not tested)  LOWER EXTREMITY ROM:     Active  Right eval Left eval  Hip flexion [facility]  Hip extension [facility]  Hip abduction [facility]  Hip adduction [facility]  Hip internal rotation [facility]  Hip external rotation [facility]  Knee flexion [facility]  Knee extension [facility]  Ankle dorsiflexion [facility]  Ankle plantarflexion    Ankle inversion    Ankle eversion     (Blank rows = not tested)  LOWER EXTREMITY MMT:    MMT Right eval Left eval Right 05/26/23 Left 05/26/23  Hip flexion 4 4 4+ 4  Hip extension 3+ 3+ 4- 4-  Hip abduction 3+ 3+ 4- 4-  Hip adduction 4 4 4 4   Hip internal rotation 4- 4- 4+ 4+  Hip external rotation 4- 4- 4+ 4+  Knee flexion 4- 4 4 4   Knee extension 4- 4 4 4   Ankle dorsiflexion 4- 4- 4+ 4+  Ankle plantarflexion 4 4    Ankle inversion 4 4    Ankle eversion 4 4     (Blank rows = not tested)  UPPER EXTREMITY MMT:  MMT Right eval Left eval Right 05/26/23 Left 05/26/23  Shoulder flexion 4- 3+ 4 4-  Shoulder extension 4 4 4+ 4+  Shoulder abduction 3+ 3+ 4- 4-  Shoulder adduction      Shoulder extension      Shoulder internal rotation 4 4    Shoulder external rotation 4- 3+ 4- 3+  Middle trapezius 4- 4- 4 4  Lower trapezius 4- 4- 4 4  Elbow flexion 4- 4- 4- 4-  Elbow extension 4 4  4 4  Wrist flexion      Wrist extension      Wrist ulnar deviation      Wrist radial deviation      Wrist pronation      Wrist supination      Grip strength       (Blank rows = not tested)  UPPER EXTREMITY ROM:  Active ROM Right eval Left eval   Shoulder flexion [facility]  Shoulder extension [facility]  Shoulder abduction [facility]  Shoulder adduction [facility]  Shoulder extension [facility]  Shoulder internal rotation    Shoulder external rotation    Elbow flexion    Elbow extension    Wrist flexion    Wrist extension    Wrist ulnar deviation    Wrist radial deviation    Wrist pronation    Wrist supination     (Blank rows = not tested)   Note: Core strength 3/5 in supine with (with arms extended)  05/26/23: Core strength 3+/5  FUNCTIONAL TESTS:  30 seconds chair stand test 7 reps  GAIT: Distance walked: 100 ft to treatment area Assistive device utilized: None Level of assistance: Complete Independence Comments: Flexed trunk, varying stride lengths bil and postural sway noted  TREATMENT [facility] Adult PT Treatment:                                                DATE: 06/02/23 Vitals:   06/02/23 1418 06/02/23 1452  BP: (!) 132/98 (!) 162/93  Pulse: 91 93  SpO2: 96% 98%    Bike L3 x 3 min MET 3.0 Elliptical L1 x 3 min, Ramp L1  Tricep press down 2 x 12 13# Palloff press 1 x 10 10# bil UE push with contralateral UE pull 2 x 6 7# Sit to stand to fagiue with 5# KB x 2 sets Jimmye Norman Carries 2 x 180 ft 2- 5# KB   [facility] Adult PT Treatment:                                                DATE: 05/26/23 Vitals:   05/26/23 1336 05/26/23 1415  BP: (!) 148/90 (!) 137/95  Pulse: 95 77  SpO2: 96% 98%   Nu-step L8 x 6 min UE/LE Standing tricep extension 2 x 10 10# freemtoion Step ups on 8inch step Standing balance on bosu 4 x 1 min ea. Verbal cues on ways to maintain balanc.  Reviewed MMT, goals and functional improvements compared to evaluation   [facility] Adult PT Treatment:                                                DATE: 05/19/23 Vitals:   05/19/23 0935 05/19/23 1013  BP: (!) 146/92 (!) 138/92  Pulse: 83 97  SpO2: 97% (!) 77%    MTPR along the R upper trap/ levator and cervical paraspinal UBE L1 x 4 min (FWD/ BWD x 2 min  ea) VOR1 & VOR2 rhomberg x 20 ea (cues for slow head movements). Repeated in modified tandemo Palloff press 1 x 10 bil  10# using freemtion - verbal cues on posture/ form Worked posture during exams using soft lock in knees vs hyper extension to promote muscle control  PATIENT EDUCATION: 06/02/23 Education details: importance of focusing on breathing during exercise and effects not breathing efficiently and how it can impact function.  Person educated: Patient Education method: Explanation, Demonstration, Verbal cues, and Handouts Education comprehension: verbalized understanding  HOME EXERCISE PROGRAM: Access Code: [email or web address] URL: [email or web address] Date: 05/26/2023 Prepared by: Lulu Riding  Exercises - supine bridge w/ brace  - 1 x daily - 7 x weekly - 2 sets - 10 reps - SLR  - 1 x daily - 7 x weekly - 2 sets - 10 reps - 1 hold - Sidelying Hip Abduction  - 1 x daily - 7 x weekly - 2 sets - 10 reps - Seated Transversus Abdominis Bracing  - 1 x daily - 7 x weekly - 2 sets - 10 reps - 5 second hold - Scapular retraction with ER (MONEY)  - 1 x daily - 7 x weekly - 2 sets - 10 reps - Seated Gentle Upper Trapezius Stretch  - 1 x daily - 7 x weekly - 1 sets - 3 reps - 30 hold - Gentle Levator Scapulae Stretch  - 1 x daily - 7 x weekly - 3 sets - 3 reps - 30 hold - Standing Shoulder Row with Anchored Resistance  - 1 x daily - 7 x weekly - 2 sets - 10 reps - Seated Abdominal Press into [employer]  - 1 x daily - 7 x weekly - 2 sets - 10 reps - 3-5 seconds hold - Elbow Extension with Resistance  - 1 x daily - 7 x weekly - 2 sets - 12 reps  ASSESSMENT:  CLINICAL IMPRESSION: 06/02/2023 Lanyla arrives to PT today noting some fatigue from a earlier cardiac stress test which she went for 8 min. Continued working gross UE/LE core strengthening with emphasis on endurance and standing tolerance with avoid leaning which she is improving with but does continue to require intermittent  verbal cues to avoid leaning back or narrowing her BOS. She did very well with the farmer caries using 2 5# KB keeping efficient posture.     EVAL- Patient is a 33 y.o. F who was seen today for physical therapy evaluation and treatment for General weakness following her Right ventriculoateral shunt revision on 02/22/2023. She demonstrate function ROM with the exception of limited cervical L rotation. She demonstrates gross weakness in both bil UE/LE, seated /standing she demonstrates postural sway.  She reports have 1 fall down a flight of steps noting she only had bruises and didn't hit her head but also didn't notify her MD or neurosurgeon. Vitals were assessed before and after session with BP starting at 141/97 and exhibited improvement at end of session following exercise in both supine and seated  position. Yarah reports challenges with endurance and fatigue and noted it has been more challenging this week due to return to work full status and had worked 3 consecutive 10 hours shifts. She would benefit from physical therapy to decrease neck stiffness/ tenderness, improve gross UE/LE strength, maximize stability and balance.   OBJECTIVE IMPAIRMENTS: decreased activity tolerance, decreased endurance, decreased ROM, decreased strength, improper body mechanics, and postural dysfunction.   ACTIVITY LIMITATIONS: carrying, lifting, bending, standing, squatting, reach over head, and locomotion level  PARTICIPATION LIMITATIONS: shopping, community activity, and occupation  PERSONAL FACTORS: Behavior pattern, Past/current experiences, Time since onset of injury/illness/exacerbation, and  1-2 comorbidities: POTS, EDS  are also affecting patient's functional outcome.   REHAB POTENTIAL: Good  CLINICAL DECISION MAKING: Evolving/moderate complexity  EVALUATION COMPLEXITY: Moderate   GOALS: Goals reviewed with patient? Yes  SHORT TERM GOALS: Target date: 04/28/2023  Pt to be IND with initial HEP for  therapeutic progression Baseline: Goal status: MET 05/19/23  LONG TERM GOALS: Target date: 07/21/2023  I'm gross UE strength to >/= 4/5 to promote stablity lifting/ carrying activities / tasks Baseline:  Goal status: Ongoing 05/26/2023  2.  Improve gross LE strnegth to >/= 4/5 to assist with lifting mechanics and maintaining posture and stability  Baseline:  Goal status: Ongoing 05/26/2023  3.  Improve FOTO score to >/= 68% to demo improvement in function Baseline:  Goal status: Ongoing 05/26/2023  4.  Pt to be able to work a 10 hour day performing regular work tasks reporting </= min fatigue to demonstrate improvement in endurance. Baseline:  Status reports: moderate Goal status: Ongoing 05/26/2023  5.  Improve core stength to >/= 4/5 to promote trunk stability/ reduce postural sway and maximize safety with work and ADLs Baseline:  Goal status: Ongoing 05/26/23  6.  Pt to be IND with all HEP and will be able to maintain and progress her current LOF IND Baseline:  Goal status: Ongoing 05/26/23  7. Pt to be able stand for the entire session >/= 30 minutes and not need to rely on leaning without feeling off balance or unstable during work related tasks as well as personal ADLs per her personal goal .  Baseline:  Goal status: INITIAL PLAN:  PT FREQUENCY: 1-2x/week  PT DURATION: 8 weeks  PLANNED INTERVENTIONS: 97164- PT Re-evaluation, 97110-Therapeutic exercises, 97530- Therapeutic activity, 97112- Neuromuscular re-education, 97535- Self Care, 16109- Manual therapy, 97116- Gait training, 97014- Electrical stimulation (unattended), Patient/Family education, Dry Needling, Cryotherapy, and Moist heat.  PLAN FOR NEXT SESSION: Review/ update HEP. Gross strengthening/ endurance training. Continued EMOMs progress with balance training per pt request, core activation. Gave goal performing swallow study at work without relying on leaning on the bed for over for 2:30 - 3:00.  Gokul Waybright PT,  DPT, LAT, ATC  06/02/23  3:02 PM

## 2023-06-09 ENCOUNTER — Encounter: Payer: Managed Care, Other (non HMO) | Admitting: Physical Therapy

## 2023-06-16 ENCOUNTER — Ambulatory Visit: Payer: Managed Care, Other (non HMO) | Admitting: Physical Therapy

## 2023-06-16 ENCOUNTER — Encounter: Payer: Self-pay | Admitting: Physical Therapy

## 2023-06-16 VITALS — BP 124/88 | HR 78

## 2023-06-16 DIAGNOSIS — M6281 Muscle weakness (generalized): Secondary | ICD-10-CM | POA: Diagnosis not present

## 2023-06-16 NOTE — Therapy (Signed)
 OUTPATIENT PHYSICAL THERAPY THORACOLUMBAR TREATMENT    Patient Name: Roberta [name] MRN: [id number] DOB:12-06-1990, 33 y.o., female Today's Date: 06/16/2023  END OF SESSION:  PT End of Session - 06/16/23 1422     Visit Number 10    Number of Visits 16    Date for PT Re-Evaluation 07/21/23    PT Start Time 1418    PT Stop Time 1501    PT Time Calculation (min) 43 min    Activity Tolerance Patient tolerated treatment well;Patient limited by fatigue    Behavior During Therapy [facility] for tasks assessed/performed                   Past Medical History:  Diagnosis Date   Anxiety    Depression    Dumping syndrome    Ehlers-Danlos syndrome    Postural orthostatic tachycardia syndrome    Past Surgical History:  Procedure Laterality Date   BIOPSY  11/20/2020   Procedure: BIOPSY;  Surgeon: Bernette Redbird, MD;  Location: [facility];  Service: Endoscopy;;   ESOPHAGOGASTRODUODENOSCOPY N/A 11/20/2020   Procedure: ESOPHAGOGASTRODUODENOSCOPY (EGD);  Surgeon: Bernette Redbird, MD;  Location: [facility];  Service: Endoscopy;  Laterality: N/A;   INNER EAR SURGERY     Right ventriculoateral shunt revision Right 2024   UPPER GI ENDOSCOPY     Patient Active Problem List   Diagnosis Date Noted   Generalized anxiety disorder 07/02/2020   Dry heaves 03/28/2020   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, involving the cardia 03/28/2020   Nausea and vomiting 03/28/2020   Ehlers-Danlos disease 11/22/2019   Chronic eczematous otitis externa of both ears 12/07/2018   Excessive cerumen in both ear canals 12/07/2018   POTS (postural orthostatic tachycardia syndrome) 11/04/2014   Benign essential hypertension 10/10/2014   Lymphadenopathy 02/10/2011    PCP: Merri Brunette, MD  REFERRING PROVIDER:  Fatima Sanger, FNP   REFERRING DIAG: Post op weakness  Rationale for Evaluation and Treatment: Rehabilitation  THERAPY DIAG:  Muscle weakness (generalized)  ONSET DATE:  02/22/2023  SUBJECTIVE:                                                                                                                                                                                           SUBJECTIVE STATEMENT: 06/16/2023 "no falls, and I have no HA today. They did cardiac MRI which altered my shunt settings so I had to see the neurologist which they were able to get it back."  EVAL Patient arrives to PT following Right ventriculoateral shunt revision that occurred on 02/22/2023. She was readmitted after she was d/c due excessive N/V eventually D/C 3  days later. Since she was discharged she reports feeling overall fatigued and weak. She did report falling down a flight of steps (10 steps) on 12/15, but notes she didn't hurt her head oranything else and notes she was just bruised, but didn't mention this to her MD. 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   PERTINENT HISTORY:  See flow  PAIN:  Are you having pain? No  PRECAUTIONS: Fall  RED FLAGS: None   WEIGHT BEARING RESTRICTIONS: No  FALLS:  Has patient fallen in last 6 months? Yes. Number of falls 1  LIVING ENVIRONMENT: Lives with: lives alone Lives in: House/apartment Stairs:  Elevator  Has following equipment at home: None  OCCUPATION: SLP  PLOF: Independent  PATIENT GOALS: Endurance, working without feeling fatigued/ tired, taking the dog for a walk without issues.  To be able to stand for 30 min without leaning or feeling fatigued following a swallow exam.    OBJECTIVE:  Note: Objective measures were completed at Evaluation unless otherwise noted.  DIAGNOSTIC FINDINGS:  See chart  PATIENT SURVEYS:  FOTO 64%, predicted 67% 05/26/23 49% (pt noted she didn't answer the intake questions  honestly)  COGNITION: Overall cognitive status: Within functional limits for tasks assessed      POSTURE: rounded shoulders, forward head, and flexed trunk   PALPATION: TTP along the R >L scalenes/ SCM, upper trap/ levator scapulae.   CERVICAL ROM:   Active ROM A/PROM (deg) eval  Flexion 60  Extension 40  Right lateral flexion 56  Left lateral flexion 52  Right rotation 40  Left rotation 20   (Blank rows = not tested)  LOWER EXTREMITY ROM:     Active  Right eval Left eval  Hip flexion [facility]  Hip extension [facility]  Hip abduction [facility]  Hip adduction [facility]  Hip internal rotation [facility]  Hip external rotation [facility]  Knee flexion [facility]  Knee extension [facility]  Ankle dorsiflexion [facility]  Ankle plantarflexion    Ankle inversion    Ankle eversion     (Blank rows = not tested)  LOWER EXTREMITY MMT:    MMT Right eval Left eval Right 05/26/23 Left 05/26/23  Hip flexion 4 4 4+ 4  Hip extension 3+ 3+ 4- 4-  Hip abduction 3+ 3+ 4- 4-  Hip adduction 4 4 4 4   Hip internal rotation 4- 4- 4+ 4+  Hip external rotation 4- 4- 4+ 4+  Knee flexion 4- 4 4 4   Knee extension 4- 4 4 4   Ankle dorsiflexion 4- 4- 4+ 4+  Ankle plantarflexion 4 4    Ankle inversion 4 4    Ankle eversion 4 4     (Blank rows = not tested)  UPPER EXTREMITY MMT:  MMT Right eval Left eval Right 05/26/23 Left 05/26/23  Shoulder flexion 4- 3+ 4 4-  Shoulder extension 4 4 4+ 4+  Shoulder abduction 3+ 3+ 4- 4-  Shoulder adduction      Shoulder extension      Shoulder internal rotation 4 4    Shoulder external rotation 4- 3+ 4- 3+  Middle trapezius 4- 4- 4 4  Lower trapezius 4- 4- 4 4  Elbow flexion 4- 4- 4- 4-  Elbow extension 4 4 4  4  Wrist flexion      Wrist extension      Wrist ulnar deviation      Wrist radial deviation      Wrist pronation      Wrist supination      Grip strength       (Blank rows = not tested)  UPPER EXTREMITY ROM:  Active ROM Right eval Left eval   Shoulder flexion [facility]  Shoulder extension [facility]  Shoulder abduction [facility]  Shoulder adduction [facility]  Shoulder extension [facility]  Shoulder internal rotation    Shoulder external rotation    Elbow flexion    Elbow extension    Wrist flexion    Wrist extension    Wrist ulnar deviation    Wrist radial deviation    Wrist pronation    Wrist supination     (Blank rows = not tested)   Note: Core strength 3/5 in supine with (with arms extended)  05/26/23: Core strength 3+/5  FUNCTIONAL TESTS:  30 seconds chair stand test 7 reps  GAIT: Distance walked: 100 ft to treatment area Assistive device utilized: None Level of assistance: Complete Independence Comments: Flexed trunk, varying stride lengths bil and postural sway noted  TREATMENT [facility] Adult PT Treatment:                                                DATE: 06/16/23 Today's Vitals   06/16/23 1423 06/16/23 1458  BP: 132/82 124/88  Pulse: 75 78  SpO2: 99% 97%   Ellitipical L1 x 4 min ramp L1 Standing heel raise to fatigue - cues to avoid leaning/ everting ankle Marching in // with RTB around feet 2 x 20 alternating L/R Lateral band walks in // x 8  Rocker board DF/PF in // 2 x 20 Balance on rockerboard 5 x 30 sec, mod postural sway but able to maintain balance Marching on airex pad 2 x 20 Standing balance on airex pad Rhomberg position with head turns/ nods Standing balance with eyes closed 3 x 30 sec   [facility] Adult PT Treatment:                                                DATE: 06/02/23 Vitals:   06/02/23 1418 06/02/23 1452  BP: (!) 132/98 (!) 162/93  Pulse: 91 93  SpO2: 96% 98%    Bike L3 x 3 min MET 3.0 Elliptical L1 x 3 min, Ramp L1  Tricep press down 2 x 12 13# Palloff press 1 x 10 10# bil UE push with contralateral UE pull 2 x 6 7# Sit to stand to fagiue with 5# KB x 2 sets Jimmye Norman Carries 2 x 180 ft 2- 5# KB   [facility] Adult PT Treatment:                                                DATE:  05/26/23 Vitals:   05/26/23 1336 05/26/23 1415  BP: (!) 148/90 (!) 137/95  Pulse: 95 77  SpO2: 96% 98%   Nu-step L8 x 6 min  UE/LE Standing tricep extension 2 x 10 10# freemtoion Step ups on 8inch step Standing balance on bosu 4 x 1 min ea. Verbal cues on ways to maintain balanc.  Reviewed MMT, goals and functional improvements compared to evaluation   PATIENT EDUCATION: 06/02/23 Education details: importance of focusing on breathing during exercise and effects not breathing efficiently and how it can impact function.  Person educated: Patient Education method: Explanation, Demonstration, Verbal cues, and Handouts Education comprehension: verbalized understanding  HOME EXERCISE PROGRAM: Access Code: [email or web address] URL: [email or web address] Date: 05/26/2023 Prepared by: Lulu Riding  Exercises - supine bridge w/ brace  - 1 x daily - 7 x weekly - 2 sets - 10 reps - SLR  - 1 x daily - 7 x weekly - 2 sets - 10 reps - 1 hold - Sidelying Hip Abduction  - 1 x daily - 7 x weekly - 2 sets - 10 reps - Seated Transversus Abdominis Bracing  - 1 x daily - 7 x weekly - 2 sets - 10 reps - 5 second hold - Scapular retraction with ER (MONEY)  - 1 x daily - 7 x weekly - 2 sets - 10 reps - Seated Gentle Upper Trapezius Stretch  - 1 x daily - 7 x weekly - 1 sets - 3 reps - 30 hold - Gentle Levator Scapulae Stretch  - 1 x daily - 7 x weekly - 3 sets - 3 reps - 30 hold - Standing Shoulder Row with Anchored Resistance  - 1 x daily - 7 x weekly - 2 sets - 10 reps - Seated Abdominal Press into [employer]  - 1 x daily - 7 x weekly - 2 sets - 10 reps - 3-5 seconds hold - Elbow Extension with Resistance  - 1 x daily - 7 x weekly - 2 sets - 12 reps  ASSESSMENT:  CLINICAL IMPRESSION: 06/16/2023 Jasimine is making progress with her stability and postural awareness/ control.  She notes improving her endurance during work avoiding leaning which she noted beable to maintain efficient posture during work  related tasks for 3 min. Continued working on strengthening to assist with balance strategies. She does exhibit mod postural sway but was able to maintain her balance with intermittent cues for balance control. BP demonstrated improvement and appears her medication is helping this visit, and additionally reports no HA today.     EVAL- Patient is a 33 y.o. F who was seen today for physical therapy evaluation and treatment for General weakness following her Right ventriculoateral shunt revision on 02/22/2023. She demonstrate function ROM with the exception of limited cervical L rotation. She demonstrates gross weakness in both bil UE/LE, seated /standing she demonstrates postural sway.  She reports have 1 fall down a flight of steps noting she only had bruises and didn't hit her head but also didn't notify her MD or neurosurgeon. Vitals were assessed before and after session with BP starting at 141/97 and exhibited improvement at end of session following exercise in both supine and seated  position. Arleny reports challenges with endurance and fatigue and noted it has been more challenging this week due to return to work full status and had worked 3 consecutive 10 hours shifts. She would benefit from physical therapy to decrease neck stiffness/ tenderness, improve gross UE/LE strength, maximize stability and balance.   OBJECTIVE IMPAIRMENTS: decreased activity tolerance, decreased endurance, decreased ROM, decreased strength, improper body mechanics, and postural dysfunction.   ACTIVITY LIMITATIONS: carrying, lifting, bending, standing, squatting, reach over  head, and locomotion level  PARTICIPATION LIMITATIONS: shopping, community activity, and occupation  PERSONAL FACTORS: Behavior pattern, Past/current experiences, Time since onset of injury/illness/exacerbation, and 1-2 comorbidities: POTS, EDS  are also affecting patient's functional outcome.   REHAB POTENTIAL: Good  CLINICAL DECISION MAKING:  Evolving/moderate complexity  EVALUATION COMPLEXITY: Moderate   GOALS: Goals reviewed with patient? Yes  SHORT TERM GOALS: Target date: 04/28/2023  Pt to be IND with initial HEP for therapeutic progression Baseline: Goal status: MET 05/19/23  LONG TERM GOALS: Target date: 07/21/2023  I'm gross UE strength to >/= 4/5 to promote stablity lifting/ carrying activities / tasks Baseline:  Goal status: Ongoing 05/26/2023  2.  Improve gross LE strnegth to >/= 4/5 to assist with lifting mechanics and maintaining posture and stability  Baseline:  Goal status: Ongoing 05/26/2023  3.  Improve FOTO score to >/= 68% to demo improvement in function Baseline:  Goal status: Ongoing 05/26/2023  4.  Pt to be able to work a 10 hour day performing regular work tasks reporting </= min fatigue to demonstrate improvement in endurance. Baseline:  Status reports: moderate Goal status: Ongoing 05/26/2023  5.  Improve core stength to >/= 4/5 to promote trunk stability/ reduce postural sway and maximize safety with work and ADLs Baseline:  Goal status: Ongoing 05/26/23  6.  Pt to be IND with all HEP and will be able to maintain and progress her current LOF IND Baseline:  Goal status: Ongoing 05/26/23  7. Pt to be able stand for the entire session >/= 30 minutes and not need to rely on leaning without feeling off balance or unstable during work related tasks as well as personal ADLs per her personal goal .  Baseline:  Goal status: INITIAL PLAN:  PT FREQUENCY: 1-2x/week  PT DURATION: 8 weeks  PLANNED INTERVENTIONS: 97164- PT Re-evaluation, 97110-Therapeutic exercises, 97530- Therapeutic activity, 97112- Neuromuscular re-education, 97535- Self Care, 16109- Manual therapy, 97116- Gait training, 97014- Electrical stimulation (unattended), Patient/Family education, Dry Needling, Cryotherapy, and Moist heat.  PLAN FOR NEXT SESSION: Review/ update HEP. Gross strengthening/ endurance training. Continued EMOMs progress  with balance training per pt request, core activation. Gave goal performing swallow study at work without relying on leaning on the bed for 3:00 - 4:00.   Sibbie Flammia PT, DPT, LAT, ATC  06/16/23  7:34 PM

## 2023-06-23 ENCOUNTER — Ambulatory Visit: Payer: Managed Care, Other (non HMO) | Attending: Registered Nurse | Admitting: Physical Therapy

## 2023-06-23 ENCOUNTER — Encounter: Payer: Self-pay | Admitting: Physical Therapy

## 2023-06-23 VITALS — BP 122/96 | HR 75

## 2023-06-23 DIAGNOSIS — M6281 Muscle weakness (generalized): Secondary | ICD-10-CM | POA: Diagnosis present

## 2023-06-23 DIAGNOSIS — Z8241 Family history of sudden cardiac death: Secondary | ICD-10-CM | POA: Insufficient documentation

## 2023-06-23 NOTE — Therapy (Signed)
 OUTPATIENT PHYSICAL THERAPY THORACOLUMBAR TREATMENT    Patient Name: Roberta [name] MRN: [id number] DOB:01-Mar-1991, 33 y.o., female Today's Date: 06/23/2023  END OF SESSION:  PT End of Session - 06/23/23 1424     Visit Number 11    Number of Visits 16    Date for PT Re-Evaluation 07/21/23    Authorization Type cigna [id number]    PT Start Time 1420    PT Stop Time 1501    PT Time Calculation (min) 41 min    Activity Tolerance Patient tolerated treatment well;Patient limited by fatigue                    Past Medical History:  Diagnosis Date   Anxiety    Depression    Dumping syndrome    Ehlers-Danlos syndrome    Postural orthostatic tachycardia syndrome    Past Surgical History:  Procedure Laterality Date   BIOPSY  11/20/2020   Procedure: BIOPSY;  Surgeon: Bernette Redbird, MD;  Location: [facility];  Service: Endoscopy;;   ESOPHAGOGASTRODUODENOSCOPY N/A 11/20/2020   Procedure: ESOPHAGOGASTRODUODENOSCOPY (EGD);  Surgeon: Bernette Redbird, MD;  Location: [facility];  Service: Endoscopy;  Laterality: N/A;   INNER EAR SURGERY     Right ventriculoateral shunt revision Right 2024   UPPER GI ENDOSCOPY     Patient Active Problem List   Diagnosis Date Noted   Generalized anxiety disorder 07/02/2020   Dry heaves 03/28/2020   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  ONSET DATE: 02/22/2023  SUBJECTIVE:                                                                                                                                                                                            SUBJECTIVE STATEMENT: 06/23/2023 "No falls since being seen last, saw cardiologist today potential herdeditary involvement"  EVAL Patient arrives to PT following Right ventriculoateral shunt revision that occurred on 02/22/2023. She was readmitted after she was d/c due excessive N/V eventually D/C 3 days later. Since she was discharged she reports feeling overall fatigued and weak. She did report falling down a flight of steps (  10 steps) on 12/15, but notes she didn't hurt her head oranything else and notes she was just bruised, but didn't mention this to her MD. 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   PERTINENT HISTORY:  See flow  PAIN:  Are you having pain? No  PRECAUTIONS: Fall  RED FLAGS: None   WEIGHT BEARING RESTRICTIONS: No  FALLS:  Has patient fallen in last 6 months? Yes. Number of falls 1  LIVING ENVIRONMENT: Lives with: lives alone Lives in: House/apartment Stairs:  Elevator  Has following equipment at home: None  OCCUPATION: SLP  PLOF: Independent  PATIENT GOALS: Endurance, working without feeling fatigued/ tired, taking the dog for a walk without issues.  To be able to stand for 30 min without leaning or feeling fatigued following a swallow exam.    OBJECTIVE:  Note: Objective measures were completed at Evaluation unless otherwise noted.  DIAGNOSTIC FINDINGS:  See chart  PATIENT SURVEYS:  FOTO 64%, predicted 67% 05/26/23 49% (pt noted she didn't answer the intake questions honestly)  COGNITION: Overall cognitive status: Within functional limits for tasks assessed      POSTURE: rounded  shoulders, forward head, and flexed trunk   PALPATION: TTP along the R >L scalenes/ SCM, upper trap/ levator scapulae.   CERVICAL ROM:   Active ROM A/PROM (deg) eval  Flexion 60  Extension 40  Right lateral flexion 56  Left lateral flexion 52  Right rotation 40  Left rotation 20   (Blank rows = not tested)  LOWER EXTREMITY ROM:     Active  Right eval Left eval  Hip flexion [facility]  Hip extension [facility]  Hip abduction [facility]  Hip adduction [facility]  Hip internal rotation [facility]  Hip external rotation [facility]  Knee flexion [facility]  Knee extension [facility]  Ankle dorsiflexion [facility]  Ankle plantarflexion    Ankle inversion    Ankle eversion     (Blank rows = not tested)  LOWER EXTREMITY MMT:    MMT Right eval Left eval Right 05/26/23 Left 05/26/23  Hip flexion 4 4 4+ 4  Hip extension 3+ 3+ 4- 4-  Hip abduction 3+ 3+ 4- 4-  Hip adduction 4 4 4 4   Hip internal rotation 4- 4- 4+ 4+  Hip external rotation 4- 4- 4+ 4+  Knee flexion 4- 4 4 4   Knee extension 4- 4 4 4   Ankle dorsiflexion 4- 4- 4+ 4+  Ankle plantarflexion 4 4    Ankle inversion 4 4    Ankle eversion 4 4     (Blank rows = not tested)  UPPER EXTREMITY MMT:  MMT Right eval Left eval Right 05/26/23 Left 05/26/23  Shoulder flexion 4- 3+ 4 4-  Shoulder extension 4 4 4+ 4+  Shoulder abduction 3+ 3+ 4- 4-  Shoulder adduction      Shoulder extension      Shoulder internal rotation 4 4    Shoulder external rotation 4- 3+ 4- 3+  Middle trapezius 4- 4- 4 4  Lower trapezius 4- 4- 4 4  Elbow flexion 4- 4- 4- 4-  Elbow extension 4 4 4 4   Wrist flexion      Wrist extension      Wrist ulnar deviation  Wrist radial deviation      Wrist pronation      Wrist supination      Grip strength       (Blank rows = not tested)  UPPER EXTREMITY ROM:  Active ROM Right eval Left eval  Shoulder flexion [facility]  Shoulder extension [facility]  Shoulder abduction [facility]  Shoulder adduction [facility] W[facility]  Shoulder  extension [facility]  Shoulder internal rotation    Shoulder external rotation    Elbow flexion    Elbow extension    Wrist flexion    Wrist extension    Wrist ulnar deviation    Wrist radial deviation    Wrist pronation    Wrist supination     (Blank rows = not tested)   Note: Core strength 3/5 in supine with (with arms extended)  05/26/23: Core strength 3+/5  FUNCTIONAL TESTS:  30 seconds chair stand test 7 reps  GAIT: Distance walked: 100 ft to treatment area Assistive device utilized: None Level of assistance: Complete Independence Comments: Flexed trunk, varying stride lengths bil and postural sway noted  TREATMENT [facility] Adult PT Treatment:                                                DATE: 06/23/23 Vitals:   06/23/23 1424 06/23/23 1454  BP: (!) 128/92 (!) 122/96  Pulse: 72 75  SpO2: 95% 98%   Ellitipical L2 x 5 min ramp L1 Wood rocker board DF/PF 2 x 20  in // Standing on rocker board avoiding sides from touching on the ground with head turns laterally and up/ down Step up on to Bosu 2 x 15 in // Tandem walking on airex beam forward/ backward in // Lateral stepping bil along airex beam in //   [facility] Adult PT Treatment:                                                DATE: 06/16/23 Today's Vitals   06/16/23 1423 06/16/23 1458  BP: 132/82 124/88  Pulse: 75 78  SpO2: 99% 97%   Ellitipical L1 x 4 min ramp L1 Standing heel raise to fatigue - cues to avoid leaning/ everting ankle Marching in // with RTB around feet 2 x 20 alternating L/R Lateral band walks in // x 8  Rocker board DF/PF in // 2 x 20 Balance on rockerboard 5 x 30 sec, mod postural sway but able to maintain balance Marching on airex pad 2 x 20 Standing balance on airex pad Rhomberg position with head turns/ nods Standing balance with eyes closed 3 x 30 sec   [facility] Adult PT Treatment:                                                DATE: 06/02/23 Vitals:   06/02/23 1418 06/02/23 1452  BP: (!) 132/98 (!)  162/93  Pulse: 91 93  SpO2: 96% 98%   Bike L3 x 3 min MET 3.0 Elliptical L1 x 3 min, Ramp L1  Tricep press down 2 x 12 13# Palloff press 1  x 10 10# bil UE push with contralateral UE pull 2 x 6 7# Sit to stand to fagiue with 5# KB x 2 sets Jimmye Norman Carries 2 x 180 ft 2- 5# KB  PATIENT EDUCATION: 06/02/23 Education details: importance of focusing on breathing during exercise and effects not breathing efficiently and how it can impact function.  Person educated: Patient Education method: Explanation, Demonstration, Verbal cues, and Handouts Education comprehension: verbalized understanding  HOME EXERCISE PROGRAM: Access Code: [email or web address] URL: [email or web address] Date: 05/26/2023 Prepared by: Lulu Riding  Exercises - supine bridge w/ brace  - 1 x daily - 7 x weekly - 2 sets - 10 reps - SLR  - 1 x daily - 7 x weekly - 2 sets - 10 reps - 1 hold - Sidelying Hip Abduction  - 1 x daily - 7 x weekly - 2 sets - 10 reps - Seated Transversus Abdominis Bracing  - 1 x daily - 7 x weekly - 2 sets - 10 reps - 5 second hold - Scapular retraction with ER (MONEY)  - 1 x daily - 7 x weekly - 2 sets - 10 reps - Seated Gentle Upper Trapezius Stretch  - 1 x daily - 7 x weekly - 1 sets - 3 reps - 30 hold - Gentle Levator Scapulae Stretch  - 1 x daily - 7 x weekly - 3 sets - 3 reps - 30 hold - Standing Shoulder Row with Anchored Resistance  - 1 x daily - 7 x weekly - 2 sets - 10 reps - Seated Abdominal Press into [employer]  - 1 x daily - 7 x weekly - 2 sets - 10 reps - 3-5 seconds hold - Elbow Extension with Resistance  - 1 x daily - 7 x weekly - 2 sets - 12 reps  ASSESSMENT:  CLINICAL IMPRESSION: 06/23/2023 Patient continues to make progress with endurance and no report of falls. She notes her BP has been under control with her meds and today's measures reflected that as well. Continued progressing elliptical time/ resistance which she did fatigue requiring cues to avoid leaning back.  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  EVAL- Patient is a 33 y.o. F who was seen today for physical therapy evaluation and treatment for General weakness following her Right ventriculoateral shunt revision on 02/22/2023. She demonstrate function ROM with the exception of limited cervical L rotation. She demonstrates gross weakness in both bil UE/LE, seated /standing she demonstrates postural sway.  She reports have 1 fall down a flight of steps noting she only had bruises and didn't hit her head but also didn't notify her MD or neurosurgeon. Vitals were assessed before and after session with BP starting at 141/97 and exhibited improvement at end of session following exercise in both supine and seated  position. Roberta [name] reports challenges with endurance and fatigue and noted it has been more challenging this week due to return to work full status and had worked 3 consecutive 10 hours shifts. She would benefit from physical therapy to decrease neck stiffness/ tenderness, improve gross UE/LE strength, maximize stability and balance.   OBJECTIVE IMPAIRMENTS: decreased activity tolerance, decreased endurance, decreased ROM, decreased strength, improper body mechanics, and postural dysfunction.   ACTIVITY LIMITATIONS: carrying, lifting, bending, standing, squatting, reach over head, and locomotion level  PARTICIPATION LIMITATIONS: shopping, community activity, and occupation  PERSONAL FACTORS: Behavior pattern,  Past/current experiences, Time since onset of injury/illness/exacerbation, and 1-2 comorbidities: POTS, EDS  are also affecting patient's functional outcome.   REHAB POTENTIAL: Good  CLINICAL DECISION MAKING: Evolving/moderate complexity  EVALUATION COMPLEXITY: Moderate   GOALS: Goals reviewed with patient? Yes  SHORT TERM GOALS: Target date: 04/28/2023  Pt to be  IND with initial HEP for therapeutic progression Baseline: Goal status: MET 05/19/23  LONG TERM GOALS: Target date: 07/21/2023  I'm gross UE strength to >/= 4/5 to promote stablity lifting/ carrying activities / tasks Baseline:  Goal status: Ongoing 05/26/2023  2.  Improve gross LE strnegth to >/= 4/5 to assist with lifting mechanics and maintaining posture and stability  Baseline:  Goal status: Ongoing 05/26/2023  3.  Improve FOTO score to >/= 68% to demo improvement in function Baseline:  Goal status: Ongoing 05/26/2023  4.  Pt to be able to work a 10 hour day performing regular work tasks reporting </= min fatigue to demonstrate improvement in endurance. Baseline:  Status reports: moderate Goal status: Ongoing 05/26/2023  5.  Improve core stength to >/= 4/5 to promote trunk stability/ reduce postural sway and maximize safety with work and ADLs Baseline:  Goal status: Ongoing 05/26/23  6.  Pt to be IND with all HEP and will be able to maintain and progress her current LOF IND Baseline:  Goal status: Ongoing 05/26/23  7. Pt to be able stand for the entire session >/= 30 minutes and not need to rely on leaning without feeling off balance or unstable during work related tasks as well as personal ADLs per her personal goal .  Baseline:  Goal status: INITIAL PLAN:  PT FREQUENCY: 1-2x/week  PT DURATION: 8 weeks  PLANNED INTERVENTIONS: 97164- PT Re-evaluation, 97110-Therapeutic exercises, 97530- Therapeutic activity, 97112- Neuromuscular re-education, 97535- Self Care, [id number]- Manual therapy, 97116- Gait training, 97014- Electrical stimulation (unattended), Patient/Family education, Dry Needling, Cryotherapy, and Moist heat.  PLAN FOR NEXT SESSION: Review/ update HEP. Gross strengthening/ endurance training. Continued EMOMs progress with balance training per pt request, core activation. Gave goal performing swallow study at work without relying on leaning on the bed for 3:00 and after taking a  short break to work on getting an additional minute without leaning.   Cynthea Zachman PT, DPT, LAT, ATC  06/23/23  5:45 PM

## 2023-06-30 ENCOUNTER — Ambulatory Visit: Admitting: Physical Therapy

## 2023-06-30 VITALS — BP 124/96 | HR 73

## 2023-06-30 DIAGNOSIS — M6281 Muscle weakness (generalized): Secondary | ICD-10-CM | POA: Diagnosis not present

## 2023-06-30 NOTE — Therapy (Signed)
 OUTPATIENT PHYSICAL THERAPY THORACOLUMBAR TREATMENT    Patient Name: Roberta [name] MRN: [id number] DOB:1990-09-25, 33 y.o., female Today's Date: 06/30/2023  END OF SESSION:  PT End of Session - 06/30/23 1025     Visit Number 12    Number of Visits 16    Date for PT Re-Evaluation 07/21/23    Authorization Type cigna [id number]    PT Start Time 1022    PT Stop Time 1105    PT Time Calculation (min) 43 min    Activity Tolerance Patient tolerated treatment well;Patient limited by fatigue                    Past Medical History:  Diagnosis Date   Anxiety    Depression    Dumping syndrome    Ehlers-Danlos syndrome    Postural orthostatic tachycardia syndrome    Past Surgical History:  Procedure Laterality Date   BIOPSY  11/20/2020   Procedure: BIOPSY;  Surgeon: Bernette Redbird, MD;  Location: [facility];  Service: Endoscopy;;   ESOPHAGOGASTRODUODENOSCOPY N/A 11/20/2020   Procedure: ESOPHAGOGASTRODUODENOSCOPY (EGD);  Surgeon: Bernette Redbird, MD;  Location: [facility];  Service: Endoscopy;  Laterality: N/A;   INNER EAR SURGERY     Right ventriculoateral shunt revision Right 2024   UPPER GI ENDOSCOPY     Patient Active Problem List   Diagnosis Date Noted   Generalized anxiety disorder 07/02/2020   Dry heaves 03/28/2020   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, involving the cardia 03/28/2020   Nausea and vomiting 03/28/2020   Ehlers-Danlos disease 11/22/2019   Chronic eczematous otitis externa of both ears 12/07/2018   Excessive cerumen in both ear canals 12/07/2018   POTS (postural orthostatic tachycardia syndrome) 11/04/2014   Benign essential hypertension 10/10/2014   Lymphadenopathy 02/10/2011    PCP: Merri Brunette, MD  REFERRING PROVIDER:  Fatima Sanger, FNP   REFERRING DIAG: Post op weakness  Rationale for Evaluation and Treatment: Rehabilitation  THERAPY DIAG:  Muscle weakness (generalized)  ONSET DATE: 02/22/2023  SUBJECTIVE:                                                                                                                                                                                            SUBJECTIVE STATEMENT: 06/30/2023 "No falls since being seen last, saw cardiologist today potential herdeditary involvement"  EVAL Patient arrives to PT following Right ventriculoateral shunt revision that occurred on 02/22/2023. She was readmitted after she was d/c due excessive N/V eventually D/C 3 days later. Since she was discharged she [name] feeling overall fatigued and weak. She did report falling down a flight of steps (  10 steps) on 12/15, but notes she didn't hurt her head oranything else and notes she was just bruised, but didn't mention this to her MD. 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   PERTINENT HISTORY:  See flow  PAIN:  Are you having pain? No  PRECAUTIONS: Fall  RED FLAGS: None   WEIGHT BEARING RESTRICTIONS: No  FALLS:  Has patient fallen in last 6 months? Yes. Number of falls 1  LIVING ENVIRONMENT: Lives with: lives alone Lives in: House/apartment Stairs:  Elevator  Has following equipment at home: None  OCCUPATION: SLP  PLOF: Independent  PATIENT GOALS: Endurance, working without feeling fatigued/ tired, taking the dog for a walk without issues.  To be able to stand for 30 min without leaning or feeling fatigued following a swallow exam.    OBJECTIVE:  Note: Objective measures were completed at Evaluation unless otherwise noted.  DIAGNOSTIC FINDINGS:  See chart  PATIENT SURVEYS:  FOTO 64%, predicted 67% 05/26/23 49% (pt noted she didn't answer the intake questions honestly)  COGNITION: Overall cognitive status: Within functional limits for tasks  assessed      POSTURE: rounded shoulders, forward head, and flexed trunk   PALPATION: TTP along the R >L scalenes/ SCM, upper trap/ levator scapulae.   CERVICAL ROM:   Active ROM A/PROM (deg) eval  Flexion 60  Extension 40  Right lateral flexion 56  Left lateral flexion 52  Right rotation 40  Left rotation 20   (Blank rows = not tested)  LOWER EXTREMITY ROM:     Active  Right eval Left eval  Hip flexion [facility]  Hip extension [facility]  Hip abduction [facility]  Hip adduction [facility]  Hip internal rotation [facility]  Hip external rotation [facility]  Knee flexion [facility]  Knee extension [facility]  Ankle dorsiflexion [facility]  Ankle plantarflexion    Ankle inversion    Ankle eversion     (Blank rows = not tested)  LOWER EXTREMITY MMT:    MMT Right eval Left eval Right 05/26/23 Left 05/26/23  Hip flexion 4 4 4+ 4  Hip extension 3+ 3+ 4- 4-  Hip abduction 3+ 3+ 4- 4-  Hip adduction 4 4 4 4   Hip internal rotation 4- 4- 4+ 4+  Hip external rotation 4- 4- 4+ 4+  Knee flexion 4- 4 4 4   Knee extension 4- 4 4 4   Ankle dorsiflexion 4- 4- 4+ 4+  Ankle plantarflexion 4 4    Ankle inversion 4 4    Ankle eversion 4 4     (Blank rows = not tested)  UPPER EXTREMITY MMT:  MMT Right eval Left eval Right 05/26/23 Left 05/26/23  Shoulder flexion 4- 3+ 4 4-  Shoulder extension 4 4 4+ 4+  Shoulder abduction 3+ 3+ 4- 4-  Shoulder adduction      Shoulder extension      Shoulder internal rotation 4 4    Shoulder external rotation 4- 3+ 4- 3+  Middle trapezius 4- 4- 4 4  Lower trapezius 4- 4- 4 4  Elbow flexion 4- 4- 4- 4-  Elbow extension 4 4 4 4   Wrist flexion      Wrist extension      Wrist ulnar deviation  Wrist radial deviation      Wrist pronation      Wrist supination      Grip strength       (Blank rows = not tested)  UPPER EXTREMITY ROM:  Active ROM Right eval Left eval  Shoulder flexion [facility]  Shoulder extension [facility]  Shoulder abduction [facility]   Shoulder adduction [facility]  Shoulder extension [facility]  Shoulder internal rotation    Shoulder external rotation    Elbow flexion    Elbow extension    Wrist flexion    Wrist extension    Wrist ulnar deviation    Wrist radial deviation    Wrist pronation    Wrist supination     (Blank rows = not tested)   Note: Core strength 3/5 in supine with (with arms extended)  05/26/23: Core strength 3+/5  FUNCTIONAL TESTS:  30 seconds chair stand test 7 reps  GAIT: Distance walked: 100 ft to treatment area Assistive device utilized: None Level of assistance: Complete Independence Comments: Flexed trunk, varying stride lengths bil and postural sway noted  TREATMENT [facility] Adult PT Treatment:                                                DATE: 06/30/23 Vitals:   06/30/23 1027 06/30/23 1101  BP: (!) 128/96 (!) 124/96  Pulse: 72 73  SpO2: 96% 97%   Ellitipical L2 x 6 min ramp L1 Wood rocker board DF/PF 2 x 20  in // Standing on rocker board avoiding sides from touching on the ground with head turns laterally and up/ down Tandem walking on airex beam forward/ backward in // Sit to stand with 10# weight 2 x 12- compensates with fatigue by internally rotating R hip.  Corner dynamic balance with marching in place on airex pad Reviwed HEP/ balance exercises at home.    [facility] Adult PT Treatment:                                                DATE: 06/23/23 Vitals:   06/23/23 1424 06/23/23 1454  BP: (!) 128/92 (!) 122/96  Pulse: 72 75  SpO2: 95% 98%   Ellitipical L2 x 5 min ramp L1 Wood rocker board DF/PF 2 x 20  in // Standing on rocker board avoiding sides from touching on the ground with head turns laterally and up/ down Step up on to Bosu 2 x 15 in // Tandem walking on airex beam forward/ backward in // Lateral stepping bil along airex beam in //   [facility] Adult PT Treatment:                                                DATE: 06/16/23 Today's Vitals   06/16/23 1423 06/16/23 1458   BP: 132/82 124/88  Pulse: 75 78  SpO2: 99% 97%   Ellitipical L1 x 4 min ramp L1 Standing heel raise to fatigue - cues to avoid leaning/ everting ankle Marching in // with RTB around feet 2 x 20 alternating L/R Lateral band walks in //  x 8  Rocker board DF/PF in // 2 x 20 Balance on rockerboard 5 x 30 sec, mod postural sway but able to maintain balance Marching on airex pad 2 x 20 Standing balance on airex pad Rhomberg position with head turns/ nods Standing balance with eyes closed 3 x 30 sec  PATIENT EDUCATION: 06/02/23 Education details: importance of focusing on breathing during exercise and effects not breathing efficiently and how it can impact function.  Person educated: Patient Education method: Explanation, Demonstration, Verbal cues, and Handouts Education comprehension: verbalized understanding  HOME EXERCISE PROGRAM: Access Code: [email or web address] URL: [email or web address] Date: 05/26/2023 Prepared by: Lulu Riding  Exercises - supine bridge w/ brace  - 1 x daily - 7 x weekly - 2 sets - 10 reps - SLR  - 1 x daily - 7 x weekly - 2 sets - 10 reps - 1 hold - Sidelying Hip Abduction  - 1 x daily - 7 x weekly - 2 sets - 10 reps - Seated Transversus Abdominis Bracing  - 1 x daily - 7 x weekly - 2 sets - 10 reps - 5 second hold - Scapular retraction with ER (MONEY)  - 1 x daily - 7 x weekly - 2 sets - 10 reps - Seated Gentle Upper Trapezius Stretch  - 1 x daily - 7 x weekly - 1 sets - 3 reps - 30 hold - Gentle Levator Scapulae Stretch  - 1 x daily - 7 x weekly - 3 sets - 3 reps - 30 hold - Standing Shoulder Row with Anchored Resistance  - 1 x daily - 7 x weekly - 2 sets - 10 reps - Seated Abdominal Press into [employer]  - 1 x daily - 7 x weekly - 2 sets - 10 reps - 3-5 seconds hold - Elbow Extension with Resistance  - 1 x daily - 7 x weekly - 2 sets - 12 reps  ASSESSMENT:  CLINICAL IMPRESSION: 06/30/2023 Roberta [name] [name] she is making progress with her endurance  at work and is progressing toward the goal of completing an exam while standing for 3 min resting and doing an additional minute. Continued working on balance activities as well as LE strengthening to maximize LE strengthening. She does continue to demonstrate postural sway but is making progress with correcting when this occurs. Plan to see pt for the next 2 visits to determine if more PT is needed.  EVAL- Patient is a 33 y.o. F who was seen today for physical therapy evaluation and treatment for General weakness following her Right ventriculoateral shunt revision on 02/22/2023. She demonstrate function ROM with the exception of limited cervical L rotation. She demonstrates gross weakness in both bil UE/LE, seated /standing she demonstrates postural sway.  She [name] have 1 fall down a flight of steps noting she only had bruises and didn't hit her head but also didn't notify her MD or neurosurgeon. Vitals were assessed before and after session with BP starting at 141/97 and exhibited improvement at end of session following exercise in both supine and seated  position. Roberta [name] challenges with endurance and fatigue and noted it has been more challenging this week due to return to work full status and had worked 3 consecutive 10 hours shifts. She would benefit from physical therapy to decrease neck stiffness/ tenderness, improve gross UE/LE strength, maximize stability and balance.   OBJECTIVE IMPAIRMENTS: decreased activity tolerance, decreased endurance, decreased ROM, decreased strength, improper body mechanics, and postural dysfunction.   ACTIVITY LIMITATIONS: carrying,  lifting, bending, standing, squatting, reach over head, and locomotion level  PARTICIPATION LIMITATIONS: shopping, community activity, and occupation  PERSONAL FACTORS: Behavior pattern, Past/current experiences, Time since onset of injury/illness/exacerbation, and 1-2 comorbidities: POTS, EDS  are also affecting patient's  functional outcome.   REHAB POTENTIAL: Good  CLINICAL DECISION MAKING: Evolving/moderate complexity  EVALUATION COMPLEXITY: Moderate   GOALS: Goals reviewed with patient? Yes  SHORT TERM GOALS: Target date: 04/28/2023  Pt to be IND with initial HEP for therapeutic progression Baseline: Goal status: MET 05/19/23  LONG TERM GOALS: Target date: 07/21/2023  I'm gross UE strength to >/= 4/5 to promote stablity lifting/ carrying activities / tasks Baseline:  Goal status: Ongoing 05/26/2023  2.  Improve gross LE strnegth to >/= 4/5 to assist with lifting mechanics and maintaining posture and stability  Baseline:  Goal status: Ongoing 05/26/2023  3.  Improve FOTO score to >/= 68% to demo improvement in function Baseline:  Goal status: Ongoing 05/26/2023  4.  Pt to be able to work a 10 hour day performing regular work tasks reporting </= min fatigue to demonstrate improvement in endurance. Baseline:  Status [name]: moderate Goal status: Ongoing 05/26/2023  5.  Improve core stength to >/= 4/5 to promote trunk stability/ reduce postural sway and maximize safety with work and ADLs Baseline:  Goal status: Ongoing 05/26/23  6.  Pt to be IND with all HEP and will be able to maintain and progress her current LOF IND Baseline:  Goal status: Ongoing 05/26/23  7. Pt to be able stand for the entire session >/= 30 minutes and not need to rely on leaning without feeling off balance or unstable during work related tasks as well as personal ADLs per her personal goal .  Baseline:  Goal status: INITIAL PLAN:  PT FREQUENCY: 1-2x/week  PT DURATION: 8 weeks  PLANNED INTERVENTIONS: 97164- PT Re-evaluation, 97110-Therapeutic exercises, 97530- Therapeutic activity, 97112- Neuromuscular re-education, 97535- Self Care, 16109- Manual therapy, 97116- Gait training, 97014- Electrical stimulation (unattended), Patient/Family education, Dry Needling, Cryotherapy, and Moist heat.  PLAN FOR NEXT SESSION: Review/  update HEP. Gross strengthening/ endurance training. Continued EMOMs progress with balance training per pt request, core activation. Gave goal performing swallow study at work without relying on leaning on the bed for 3:00 and after taking a short break to work on getting an additional minute without leaning.   Lucresha Dismuke PT, DPT, LAT, ATC  06/30/23  11:20 AM

## 2023-07-07 ENCOUNTER — Ambulatory Visit: Admitting: Physical Therapy

## 2023-07-07 ENCOUNTER — Encounter: Payer: Self-pay | Admitting: Physical Therapy

## 2023-07-07 VITALS — BP 132/98 | HR 96

## 2023-07-07 DIAGNOSIS — M6281 Muscle weakness (generalized): Secondary | ICD-10-CM | POA: Diagnosis not present

## 2023-07-07 NOTE — Therapy (Signed)
 OUTPATIENT PHYSICAL THERAPY THORACOLUMBAR TREATMENT    Patient Name: HORTENSIA DUFFIN MRN: [id number] DOB:26-Feb-1991, 33 y.o., female Today's Date: 07/07/2023  END OF SESSION:  PT End of Session - 07/07/23 1108     Visit Number 13    Number of Visits 16    Date for PT Re-Evaluation 07/21/23    Authorization Type cigna [id number]    PT Start Time 1102    PT Stop Time 1150    PT Time Calculation (min) 48 min    Activity Tolerance Patient tolerated treatment well;Patient limited by fatigue    Behavior During Therapy [facility] for tasks assessed/performed                    Past Medical History:  Diagnosis Date   Anxiety    Depression    Dumping syndrome    Ehlers-Danlos syndrome    Postural orthostatic tachycardia syndrome    Past Surgical History:  Procedure Laterality Date   BIOPSY  11/20/2020   Procedure: BIOPSY;  Surgeon: Bernette Redbird, MD;  Location: [facility];  Service: Endoscopy;;   ESOPHAGOGASTRODUODENOSCOPY N/A 11/20/2020   Procedure: ESOPHAGOGASTRODUODENOSCOPY (EGD);  Surgeon: Bernette Redbird, MD;  Location: [facility];  Service: Endoscopy;  Laterality: N/A;   INNER EAR SURGERY     Right ventriculoateral shunt revision Right 2024   UPPER GI ENDOSCOPY     Patient Active Problem List   Diagnosis Date Noted   Generalized anxiety disorder 07/02/2020   Dry heaves 03/28/2020   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  ONSET DATE: 02/22/2023  SUBJECTIVE:                                                                                                                                                                                           SUBJECTIVE STATEMENT: 07/07/2023 " I fell a few days ago, and I was trying to get my dog, and I fell forward and landed on my R side. I didn't hit my head but I am bruised since I am on a blood thinner. I didn't notify any of my providers (outside of this session).  EVAL Patient arrives to PT following Right ventriculoateral shunt revision that occurred on 02/22/2023. She was readmitted after she was d/c due excessive N/V eventually D/C 3 days later. Since she was discharged she reports feeling overall fatigued and weak. She did report falling down a flight of steps (10 steps) on 12/15, but notes she didn't hurt her head oranything else and notes she was just bruised, but didn't mention this to her MD. 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   PERTINENT HISTORY:  See flow  PAIN:  Are you having pain? No  PRECAUTIONS: Fall  RED FLAGS: None   WEIGHT BEARING RESTRICTIONS: No  FALLS:  Has patient fallen in last 6 months? Yes. Number of falls 1  LIVING ENVIRONMENT: Lives with: lives alone Lives in: House/apartment Stairs:  Elevator  Has following equipment at home: None  OCCUPATION: SLP  PLOF: Independent  PATIENT GOALS: Endurance, working without feeling fatigued/ tired, taking the dog for a walk without issues.  To be able to stand for 30 min without leaning or feeling fatigued following a swallow exam.    OBJECTIVE:  Note: Objective measures were completed at Evaluation unless otherwise noted.  DIAGNOSTIC FINDINGS:  See chart  PATIENT SURVEYS:   FOTO 64%, predicted 67% 05/26/23 49% (pt noted she didn't answer the intake questions honestly)  COGNITION: Overall cognitive status: Within functional limits for tasks assessed      POSTURE: rounded shoulders, forward head, and flexed trunk   PALPATION: TTP along the R >L scalenes/ SCM, upper trap/ levator scapulae.   CERVICAL ROM:   Active ROM A/PROM (deg) eval  Flexion 60  Extension 40  Right lateral flexion 56  Left lateral flexion 52  Right rotation 40  Left rotation 20   (Blank rows = not tested)  LOWER EXTREMITY ROM:     Active  Right eval Left eval  Hip flexion [facility]  Hip extension [facility]  Hip abduction [facility]  Hip adduction [facility]  Hip internal rotation [facility]  Hip external rotation [facility]  Knee flexion [facility]  Knee extension [facility]  Ankle dorsiflexion [facility]  Ankle plantarflexion    Ankle inversion    Ankle eversion     (Blank rows = not tested)  LOWER EXTREMITY MMT:    MMT Right eval Left eval Right 05/26/23 Left 05/26/23  Hip flexion 4 4 4+ 4  Hip extension 3+ 3+ 4- 4-  Hip abduction 3+ 3+ 4- 4-  Hip adduction 4 4 4 4   Hip internal rotation 4- 4- 4+ 4+  Hip external rotation 4- 4- 4+ 4+  Knee flexion 4- 4 4 4   Knee extension 4- 4 4 4   Ankle dorsiflexion 4- 4- 4+ 4+  Ankle plantarflexion 4 4    Ankle inversion 4 4    Ankle eversion 4 4     (Blank rows = not tested)  UPPER EXTREMITY MMT:  MMT Right eval Left eval Right 05/26/23 Left 05/26/23  Shoulder flexion 4- 3+ 4 4-  Shoulder extension 4 4 4+ 4+  Shoulder abduction 3+ 3+ 4- 4-  Shoulder adduction      Shoulder extension      Shoulder internal rotation 4 4    Shoulder external rotation 4- 3+ 4- 3+  Middle trapezius 4- 4- 4 4  Lower trapezius 4- 4- 4 4  Elbow flexion 4- 4- 4- 4-  Elbow extension 4 4 4 4   Wrist flexion      Wrist extension      Wrist ulnar deviation      Wrist radial deviation      Wrist pronation      Wrist supination      Grip strength        (Blank rows = not tested)  UPPER EXTREMITY ROM:  Active ROM Right eval Left eval  Shoulder flexion [facility]  Shoulder extension [facility]  Shoulder abduction [facility]  Shoulder adduction [facility]  Shoulder extension [facility]  Shoulder internal rotation    Shoulder external rotation    Elbow flexion    Elbow extension    Wrist flexion    Wrist extension    Wrist ulnar deviation    Wrist radial deviation    Wrist pronation    Wrist supination     (Blank rows = not tested)   Note: Core strength 3/5 in supine with (with arms extended)  05/26/23: Core strength 3+/5  FUNCTIONAL TESTS:  30 seconds chair stand test 7 reps  GAIT: Distance walked: 100 ft to treatment area Assistive device utilized: None Level of assistance: Complete Independence Comments: Flexed trunk, varying stride lengths bil and postural sway noted  TREATMENT [facility] Adult PT Treatment:                                                DATE: 07/07/23  Elliptical L2 x 5 min Standing on airex pad reaching outside BOS.  Progressed to cross body reaching tapping cones Standing on airex pad with pertubations via ball tosse outside of BOS Squating grabbing items off the floor Posture grabbing items close by to maximize stability Sit to stand with 10# KB going to fatigue   [facility] Adult PT Treatment:                                                DATE: 06/30/23 Vitals:   06/30/23 1027 06/30/23 1101  BP: (!) 128/96 (!) 124/96  Pulse: 72 73  SpO2: 96% 97%   Ellitipical L2 x 6 min ramp L1 Wood rocker board DF/PF 2 x 20  in // Standing on rocker board avoiding sides from touching on the ground with head turns laterally and up/ down Tandem walking on airex beam forward/ backward in // Sit to stand with 10# weight 2 x 12- compensates with fatigue by internally rotating R hip.  Corner dynamic balance with marching in place on airex pad Reviwed HEP/ balance exercises at home.    [facility] Adult PT Treatment:                                                 DATE: 06/23/23 Vitals:   06/23/23 1424 06/23/23 1454  BP: (!) 128/92 (!) 122/96  Pulse: 72 75  SpO2: 95% 98%   Ellitipical L2 x 5 min ramp L1 Wood rocker board DF/PF 2 x  20  in // Standing on rocker board avoiding sides from touching on the ground with head turns laterally and up/ down Step up on to Bosu 2 x 15 in // Tandem walking on airex beam forward/ backward in // Lateral stepping bil along airex beam in //  PATIENT EDUCATION: 06/02/23 Education details: importance of focusing on breathing during exercise and effects not breathing efficiently and how it can impact function.  Person educated: Patient Education method: Explanation, Demonstration, Verbal cues, and Handouts Education comprehension: verbalized understanding  HOME EXERCISE PROGRAM: Access Code: [email or web address] URL: [email or web address] Date: 05/26/2023 Prepared by: Laron Plummer  Exercises - supine bridge w/ brace  - 1 x daily - 7 x weekly - 2 sets - 10 reps - SLR  - 1 x daily - 7 x weekly - 2 sets - 10 reps - 1 hold - Sidelying Hip Abduction  - 1 x daily - 7 x weekly - 2 sets - 10 reps - Seated Transversus Abdominis Bracing  - 1 x daily - 7 x weekly - 2 sets - 10 reps - 5 second hold - Scapular retraction with ER (MONEY)  - 1 x daily - 7 x weekly - 2 sets - 10 reps - Seated Gentle Upper Trapezius Stretch  - 1 x daily - 7 x weekly - 1 sets - 3 reps - 30 hold - Gentle Levator Scapulae Stretch  - 1 x daily - 7 x weekly - 3 sets - 3 reps - 30 hold - Standing Shoulder Row with Anchored Resistance  - 1 x daily - 7 x weekly - 2 sets - 10 reps - Seated Abdominal Press into [employer]  - 1 x daily - 7 x weekly - 2 sets - 10 reps - 3-5 seconds hold - Elbow Extension with Resistance  - 1 x daily - 7 x weekly - 2 sets - 12 reps  ASSESSMENT:  CLINICAL IMPRESSION: 07/07/2023 Pt reporting having a fall since the last session which occurred as a result of trying to pick up her dog which was  outside of her BOS and ended falling landing on the R shoulder. Continued to discuss reaching out to her Neurologist when that occurs especially since she is now on a blood thinner. Continued focus on balance training on an unstable surface with reaching outside her BOS and pertubations. Finished with continued LE strengthening with emphasis on endurance. She noted being able to perform swallow studies longer and discussed contniued progressing with endurance the goal being complete a full swallow study without needing to lean as a result of balance and instability.   EVAL- Patient is a 33 y.o. F who was seen today for physical therapy evaluation and treatment for General weakness following her Right ventriculoateral shunt revision on 02/22/2023. She demonstrate function ROM with the exception of limited cervical L rotation. She demonstrates gross weakness in both bil UE/LE, seated /standing she demonstrates postural sway.  She reports have 1 fall down a flight of steps noting she only had bruises and didn't hit her head but also didn't notify her MD or neurosurgeon. Vitals were assessed before and after session with BP starting at 141/97 and exhibited improvement at end of session following exercise in both supine and seated  position. Dajane reports challenges with endurance and fatigue and noted it has been more challenging this week due to return to work full status and had worked 3 consecutive 10 hours shifts. She would benefit from physical therapy to decrease neck stiffness/  tenderness, improve gross UE/LE strength, maximize stability and balance.   OBJECTIVE IMPAIRMENTS: decreased activity tolerance, decreased endurance, decreased ROM, decreased strength, improper body mechanics, and postural dysfunction.   ACTIVITY LIMITATIONS: carrying, lifting, bending, standing, squatting, reach over head, and locomotion level  PARTICIPATION LIMITATIONS: shopping, community activity, and occupation  PERSONAL  FACTORS: Behavior pattern, Past/current experiences, Time since onset of injury/illness/exacerbation, and 1-2 comorbidities: POTS, EDS  are also affecting patient's functional outcome.   REHAB POTENTIAL: Good  CLINICAL DECISION MAKING: Evolving/moderate complexity  EVALUATION COMPLEXITY: Moderate   GOALS: Goals reviewed with patient? Yes  SHORT TERM GOALS: Target date: 04/28/2023  Pt to be IND with initial HEP for therapeutic progression Baseline: Goal status: MET 05/19/23  LONG TERM GOALS: Target date: 07/21/2023  I'm gross UE strength to >/= 4/5 to promote stablity lifting/ carrying activities / tasks Baseline:  Goal status: Ongoing 05/26/2023  2.  Improve gross LE strnegth to >/= 4/5 to assist with lifting mechanics and maintaining posture and stability  Baseline:  Goal status: Ongoing 05/26/2023  3.  Improve FOTO score to >/= 68% to demo improvement in function Baseline:  Goal status: Ongoing 05/26/2023  4.  Pt to be able to work a 10 hour day performing regular work tasks reporting </= min fatigue to demonstrate improvement in endurance. Baseline:  Status reports: moderate Goal status: Ongoing 05/26/2023  5.  Improve core stength to >/= 4/5 to promote trunk stability/ reduce postural sway and maximize safety with work and ADLs Baseline:  Goal status: Ongoing 05/26/23  6.  Pt to be IND with all HEP and will be able to maintain and progress her current LOF IND Baseline:  Goal status: Ongoing 05/26/23  7. Pt to be able stand for the entire session >/= 30 minutes and not need to rely on leaning without feeling off balance or unstable during work related tasks as well as personal ADLs per her personal goal .  Baseline:  Goal status: INITIAL PLAN:  PT FREQUENCY: 1-2x/week  PT DURATION: 8 weeks  PLANNED INTERVENTIONS: 97164- PT Re-evaluation, 97110-Therapeutic exercises, 97530- Therapeutic activity, 97112- Neuromuscular re-education, 97535- Self Care, 40981- Manual therapy, 97116-  Gait training, 97014- Electrical stimulation (unattended), Patient/Family education, Dry Needling, Cryotherapy, and Moist heat.  PLAN FOR NEXT SESSION: Review/ update HEP. Gross strengthening/ endurance training. Continued EMOMs progress with balance training per pt request, core activation. Gave goal performing swallow study at work without relying on leaning on the bed for 3:30 and after taking a short break to work on getting an additional 1:30  without leaning. Practicing a sustained squat.   Nakul Avino PT, DPT, LAT, ATC  07/07/23  12:02 PM

## 2023-07-14 ENCOUNTER — Ambulatory Visit: Admitting: Physical Therapy

## 2023-07-14 ENCOUNTER — Encounter: Payer: Self-pay | Admitting: Physical Therapy

## 2023-07-14 VITALS — BP 132/94 | HR 79

## 2023-07-14 DIAGNOSIS — M6281 Muscle weakness (generalized): Secondary | ICD-10-CM | POA: Diagnosis not present

## 2023-07-14 NOTE — Therapy (Signed)
 OUTPATIENT PHYSICAL THERAPY THORACOLUMBAR TREATMENT    Patient Name: Roberta [name] MRN: [id number] DOB:05-01-1990, 33 y.o., female Today's Date: 07/14/2023  END OF SESSION:  PT End of Session - 07/14/23 1018     Visit Number 14    Number of Visits 16    Date for PT Re-Evaluation 07/21/23    PT Start Time 1017    PT Stop Time 1103    PT Time Calculation (min) 46 min    Activity Tolerance Patient tolerated treatment well;Patient limited by fatigue    Behavior During Therapy [facility] for tasks assessed/performed                     Past Medical History:  Diagnosis Date   Anxiety    Depression    Dumping syndrome    Ehlers-Danlos syndrome    Postural orthostatic tachycardia syndrome    Past Surgical History:  Procedure Laterality Date   BIOPSY  11/20/2020   Procedure: BIOPSY;  Surgeon: Lanita Pitman, MD;  Location: [facility];  Service: Endoscopy;;   ESOPHAGOGASTRODUODENOSCOPY N/A 11/20/2020   Procedure: ESOPHAGOGASTRODUODENOSCOPY (EGD);  Surgeon: Lanita Pitman, MD;  Location: [facility];  Service: Endoscopy;  Laterality: N/A;   INNER EAR SURGERY     Right ventriculoateral shunt revision Right 2024   UPPER GI ENDOSCOPY     Patient Active Problem List   Diagnosis Date Noted   Generalized anxiety disorder 07/02/2020   Dry heaves 03/28/2020   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, involving the cardia 03/28/2020   Nausea and vomiting 03/28/2020   Ehlers-Danlos disease 11/22/2019   Chronic eczematous otitis externa of both ears 12/07/2018   Excessive cerumen in both ear canals 12/07/2018   POTS (postural orthostatic tachycardia syndrome) 11/04/2014   Benign essential hypertension 10/10/2014   Lymphadenopathy 02/10/2011    PCP: Imelda Man, MD  REFERRING PROVIDER:  Jhon Moselle, FNP   REFERRING DIAG: Post op weakness  Rationale for Evaluation and Treatment: Rehabilitation  THERAPY DIAG:  Muscle weakness (generalized)  ONSET DATE:  02/22/2023  SUBJECTIVE:                                                                                                                                                                                           SUBJECTIVE STATEMENT: 07/14/2023 "Talked with neurologist, he wasn't happy with the fact that I fell. I did the time with my swallow study goals with the peds."  EVAL Patient arrives to PT following Right ventriculoateral shunt revision that occurred on 02/22/2023. She was readmitted after she was d/c due excessive N/V eventually D/C 3 days later. Since she was discharged  she reports feeling overall fatigued and weak. She did report falling down a flight of steps (10 steps) on 12/15, but notes she didn't hurt her head oranything else and notes she was just bruised, but didn't mention this to her MD. 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   PERTINENT HISTORY:  See flow  PAIN:  Are you having pain? No  PRECAUTIONS: Fall  RED FLAGS: None   WEIGHT BEARING RESTRICTIONS: No  FALLS:  Has patient fallen in last 6 months? Yes. Number of falls 1  LIVING ENVIRONMENT: Lives with: lives alone Lives in: House/apartment Stairs:  Elevator  Has following equipment at home: None  OCCUPATION: SLP  PLOF: Independent  PATIENT GOALS: Endurance, working without feeling fatigued/ tired, taking the dog for a walk without issues.  To be able to stand for 30 min without leaning or feeling fatigued following a swallow exam.    OBJECTIVE:  Note: Objective measures were completed at Evaluation unless otherwise noted.  DIAGNOSTIC FINDINGS:  See chart  PATIENT SURVEYS:  FOTO 64%, predicted 67% 05/26/23 49% (pt noted she didn't answer the intake questions honestly)  COGNITION: Overall cognitive  status: Within functional limits for tasks assessed      POSTURE: rounded shoulders, forward head, and flexed trunk   PALPATION: TTP along the R >L scalenes/ SCM, upper trap/ levator scapulae.   CERVICAL ROM:   Active ROM A/PROM (deg) eval  Flexion 60  Extension 40  Right lateral flexion 56  Left lateral flexion 52  Right rotation 40  Left rotation 20   (Blank rows = not tested)  LOWER EXTREMITY ROM:     Active  Right eval Left eval  Hip flexion [facility]  Hip extension [facility]  Hip abduction [facility]  Hip adduction [facility]  Hip internal rotation [facility]  Hip external rotation [facility]  Knee flexion [facility]  Knee extension [facility]  Ankle dorsiflexion [facility]  Ankle plantarflexion    Ankle inversion    Ankle eversion     (Blank rows = not tested)  LOWER EXTREMITY MMT:    MMT Right eval Left eval Right 05/26/23 Left 05/26/23  Hip flexion 4 4 4+ 4  Hip extension 3+ 3+ 4- 4-  Hip abduction 3+ 3+ 4- 4-  Hip adduction 4 4 4 4   Hip internal rotation 4- 4- 4+ 4+  Hip external rotation 4- 4- 4+ 4+  Knee flexion 4- 4 4 4   Knee extension 4- 4 4 4   Ankle dorsiflexion 4- 4- 4+ 4+  Ankle plantarflexion 4 4    Ankle inversion 4 4    Ankle eversion 4 4     (Blank rows = not tested)  UPPER EXTREMITY MMT:  MMT Right eval Left eval Right 05/26/23 Left 05/26/23  Shoulder flexion 4- 3+ 4 4-  Shoulder extension 4 4 4+ 4+  Shoulder abduction 3+ 3+ 4- 4-  Shoulder adduction      Shoulder extension      Shoulder internal rotation 4 4    Shoulder external rotation 4- 3+ 4- 3+  Middle trapezius 4- 4- 4 4  Lower trapezius 4- 4- 4 4  Elbow flexion 4- 4- 4- 4-  Elbow extension 4 4 4 4   Wrist flexion  Wrist extension      Wrist ulnar deviation      Wrist radial deviation      Wrist pronation      Wrist supination      Grip strength       (Blank rows = not tested)  UPPER EXTREMITY ROM:  Active ROM Right eval Left eval  Shoulder flexion [facility]  Shoulder extension  [facility]  Shoulder abduction [facility]  Shoulder adduction [facility]  Shoulder extension [facility]  Shoulder internal rotation    Shoulder external rotation    Elbow flexion    Elbow extension    Wrist flexion    Wrist extension    Wrist ulnar deviation    Wrist radial deviation    Wrist pronation    Wrist supination     (Blank rows = not tested)   Note: Core strength 3/5 in supine with (with arms extended)  05/26/23: Core strength 3+/5  FUNCTIONAL TESTS:  30 seconds chair stand test 7 reps  GAIT: Distance walked: 100 ft to treatment area Assistive device utilized: None Level of assistance: Complete Independence Comments: Flexed trunk, varying stride lengths bil and postural sway noted  TREATMENT [facility] Adult PT Treatment:                                                DATE: 07/14/23 Vitals:   07/14/23 1020 07/14/23 1056  BP: (!) 138/99 (!) 132/94  Pulse: 79 79  SpO2: 98% 98%   Stepper L2 x 2 min Sustained mini lunge x 30 sec ( mod postural sway)  Sustained lunge/ squat rolling weighted yellow ball to mimic swallow study. Dead lift from floor x 10 with 5# KB, progressed to 4 inch step with 5# KB Standing with lead leg on 4 inch step with weighted diagonal movement with blue ball 2 x 10 bil (switching lead leg)  [facility] Adult PT Treatment:                                                DATE: 07/07/23  Elliptical L2 x 5 min Standing on airex pad reaching outside BOS.  Progressed to cross body reaching tapping cones Standing on airex pad with pertubations via ball tosse outside of BOS Squating grabbing items off the floor Posture grabbing items close by to maximize stability Sit to stand with 10# KB going to fatigue   [facility] Adult PT Treatment:                                                DATE: 06/30/23 Vitals:   06/30/23 1027 06/30/23 1101  BP: (!) 128/96 (!) 124/96  Pulse: 72 73  SpO2: 96% 97%   Ellitipical L2 x 6 min ramp L1 Wood rocker board DF/PF 2 x 20  in // Standing  on rocker board avoiding sides from touching on the ground with head turns laterally and up/ down Tandem walking on airex beam forward/ backward in // Sit to stand with 10# weight 2 x 12- compensates with fatigue by internally rotating R hip.  Corner dynamic balance with marching in place on airex pad Reviwed HEP/ balance exercises at home.    PATIENT EDUCATION: 06/02/23 Education details: importance of focusing on breathing during exercise and effects not breathing efficiently and how it can impact function.  Person educated: Patient Education method: Explanation, Demonstration, Verbal cues, and Handouts Education comprehension: verbalized understanding  HOME EXERCISE PROGRAM: Access Code: [email or web address] URL: [email or web address] Date: 05/26/2023 Prepared by: Laron Plummer  Exercises - supine bridge w/ brace  - 1 x daily - 7 x weekly - 2 sets - 10 reps - SLR  - 1 x daily - 7 x weekly - 2 sets - 10 reps - 1 hold - Sidelying Hip Abduction  - 1 x daily - 7 x weekly - 2 sets - 10 reps - Seated Transversus Abdominis Bracing  - 1 x daily - 7 x weekly - 2 sets - 10 reps - 5 second hold - Scapular retraction with ER (MONEY)  - 1 x daily - 7 x weekly - 2 sets - 10 reps - Seated Gentle Upper Trapezius Stretch  - 1 x daily - 7 x weekly - 1 sets - 3 reps - 30 hold - Gentle Levator Scapulae Stretch  - 1 x daily - 7 x weekly - 3 sets - 3 reps - 30 hold - Standing Shoulder Row with Anchored Resistance  - 1 x daily - 7 x weekly - 2 sets - 10 reps - Seated Abdominal Press into [employer]  - 1 x daily - 7 x weekly - 2 sets - 10 reps - 3-5 seconds hold - Elbow Extension with Resistance  - 1 x daily - 7 x weekly - 2 sets - 12 reps  ASSESSMENT:  CLINICAL IMPRESSION: 07/14/2023 Roberta [name] reports she has had no falls since the last session and noted she did reach out to her neurologist which promoted further imaging that she said was clear. Continued working on LE via sustained mini squat / lunge  to assist with swallow studies which she did well with UE perturbations but demonstrated moderate postural sway. She was able to maintain her balance without tactile cues but did require verbal cues to avoid leaning.   EVAL- Patient is a 33 y.o. F who was seen today for physical therapy evaluation and treatment for General weakness following her Right ventriculoateral shunt revision on 02/22/2023. She demonstrate function ROM with the exception of limited cervical L rotation. She demonstrates gross weakness in both bil UE/LE, seated /standing she demonstrates postural sway.  She reports have 1 fall down a flight of steps noting she only had bruises and didn't hit her head but also didn't notify her MD or neurosurgeon. Vitals were assessed before and after session with BP starting at 141/97 and exhibited improvement at end of session following exercise in both supine and seated  position. Ahyana reports challenges with endurance and fatigue and noted it has been more challenging this week due to return to work full status and had worked 3 consecutive 10 hours shifts. She would benefit from physical therapy to decrease neck stiffness/ tenderness, improve gross UE/LE strength, maximize stability and balance.   OBJECTIVE IMPAIRMENTS: decreased activity tolerance, decreased endurance, decreased ROM, decreased strength, improper body mechanics, and postural dysfunction.   ACTIVITY LIMITATIONS: carrying, lifting, bending, standing, squatting, reach over head, and locomotion level  PARTICIPATION LIMITATIONS: shopping, community activity, and occupation  PERSONAL FACTORS: Behavior pattern, Past/current experiences, Time since onset of injury/illness/exacerbation, and 1-2 comorbidities: POTS, EDS  are also  affecting patient's functional outcome.   REHAB POTENTIAL: Good  CLINICAL DECISION MAKING: Evolving/moderate complexity  EVALUATION COMPLEXITY: Moderate   GOALS: Goals reviewed with patient? Yes  SHORT  TERM GOALS: Target date: 04/28/2023  Pt to be IND with initial HEP for therapeutic progression Baseline: Goal status: MET 05/19/23  LONG TERM GOALS: Target date: 07/21/2023  I'm gross UE strength to >/= 4/5 to promote stablity lifting/ carrying activities / tasks Baseline:  Goal status: Ongoing 05/26/2023  2.  Improve gross LE strnegth to >/= 4/5 to assist with lifting mechanics and maintaining posture and stability  Baseline:  Goal status: Ongoing 05/26/2023  3.  Improve FOTO score to >/= 68% to demo improvement in function Baseline:  Goal status: Ongoing 05/26/2023  4.  Pt to be able to work a 10 hour day performing regular work tasks reporting </= min fatigue to demonstrate improvement in endurance. Baseline:  Status reports: moderate Goal status: Ongoing 05/26/2023  5.  Improve core stength to >/= 4/5 to promote trunk stability/ reduce postural sway and maximize safety with work and ADLs Baseline:  Goal status: Ongoing 05/26/23  6.  Pt to be IND with all HEP and will be able to maintain and progress her current LOF IND Baseline:  Goal status: Ongoing 05/26/23  7. Pt to be able stand for the entire session >/= 30 minutes and not need to rely on leaning without feeling off balance or unstable during work related tasks as well as personal ADLs per her personal goal .  Baseline:  Goal status: INITIAL PLAN:  PT FREQUENCY: 1-2x/week  PT DURATION: 8 weeks  PLANNED INTERVENTIONS: 97164- PT Re-evaluation, 97110-Therapeutic exercises, 97530- Therapeutic activity, 97112- Neuromuscular re-education, 97535- Self Care, [id number]- Manual therapy, 97116- Gait training, 97014- Electrical stimulation (unattended), Patient/Family education, Dry Needling, Cryotherapy, and Moist heat.  PLAN FOR NEXT SESSION: Review/ update HEP. Gross strengthening/ endurance training. Continued EMOMs progress with balance training per pt request, core activation. Gave goal performing swallow study at work without relying on  leaning on the bed for 3:30 and after taking a short break to work on getting an additional 1:30  without leaning. Practicing a sustained squat.   Anoushka Divito PT, DPT, LAT, ATC  07/14/23  11:12 AM

## 2023-07-20 NOTE — Therapy (Signed)
 OUTPATIENT PHYSICAL THERAPY THORACOLUMBAR TREATMENT    Patient Name: Roberta [name] MRN: [id number] DOB:1990/05/26, 33 y.o., female Today's Date: 07/21/2023  END OF SESSION:  PT End of Session - 07/21/23 1018     Visit Number 15    Number of Visits 23    Date for PT Re-Evaluation 09/15/23    Authorization Type cigna [id number]    PT Start Time 1016    PT Stop Time 1100    PT Time Calculation (min) 44 min    Activity Tolerance Patient tolerated treatment well;Patient limited by fatigue    Behavior During Therapy [facility] for tasks assessed/performed                      Past Medical History:  Diagnosis Date   Anxiety    Depression    Dumping syndrome    Ehlers-Danlos syndrome    Postural orthostatic tachycardia syndrome    Past Surgical History:  Procedure Laterality Date   BIOPSY  11/20/2020   Procedure: BIOPSY;  Surgeon: Lanita Pitman, MD;  Location: [facility]COPY;  Service: Endoscopy;;   ESOPHAGOGASTRODUODENOSCOPY N/A 11/20/2020   Procedure: ESOPHAGOGASTRODUODENOSCOPY (EGD);  Surgeon: Lanita Pitman, MD;  Location: [facility];  Service: Endoscopy;  Laterality: N/A;   INNER EAR SURGERY     Right ventriculoateral shunt revision Right 2024   UPPER GI ENDOSCOPY     Patient Active Problem List   Diagnosis Date Noted   Generalized anxiety disorder 07/02/2020   Dry heaves 03/28/2020   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, involving the cardia 03/28/2020   Nausea and vomiting 03/28/2020   Ehlers-Danlos disease 11/22/2019   Chronic eczematous otitis externa of both ears 12/07/2018   Excessive cerumen in both ear canals 12/07/2018   POTS (postural orthostatic tachycardia syndrome) 11/04/2014   Benign essential hypertension 10/10/2014   Lymphadenopathy 02/10/2011    PCP: Imelda Man, MD  REFERRING PROVIDER:  Jhon Moselle, FNP   REFERRING DIAG: Post op weakness  Rationale for Evaluation and Treatment: Rehabilitation  THERAPY DIAG:  Muscle  weakness (generalized)  POTS (postural orthostatic tachycardia syndrome)  Hypermobility syndrome  Dizziness and giddiness  ONSET DATE: 02/22/2023  SUBJECTIVE:                                                                                                                                                                                           SUBJECTIVE STATEMENT: 07/21/2023 Went to [facility] I have Rapid gastric emptying due to the type of connective tissue (cause the IC issues)    Sees [facility] next month.  Pt would like to cont PT to reduce falls, improve  balance.    EVAL Patient arrives to PT following Right ventriculoateral shunt revision that occurred on 02/22/2023. She was readmitted after she was d/c due excessive N/V eventually D/C 3 days later. Since she was discharged she reports feeling overall fatigued and weak. She did report falling down a flight of steps (10 steps) on 12/15, but notes she didn't hurt her head oranything else and notes she was just bruised, but didn't mention this to her MD. 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   PERTINENT HISTORY:  See flow Dr. Denton Flakes visit this week 07/20/23: The case was discussed with the patient at length and she was reassured regarding her cardiac status. The patient was advised to continue her current medical regimen. However she was instructed to uptitrate her dosage of guanfacine to 1 mg twice daily gradually with close follow-up of her blood pressure. 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 The patient will be initiated on acarbose 25 mg before every meal for potential management of her GI symptomatology. She was instructed to contact our office through MyChart with an  update regarding her clinical status in 4 weeks. Meanwhile, she was once again counseled regarding liberal fluid and salt intake, especially during the summertime. The patient was also counseled regarding daily aerobic exercises targeted to the lower body; small, frequent, low carbohydrate and high-protein meals; and the use of compression garments. I do not recommend any further cardiac workup at this time and the patient will continue to follow-up with her primary care physician. The patient will return to our office for a follow-up appointment in 6 months.  PAIN:  Are you having pain? No  PRECAUTIONS: Fall  RED FLAGS: None   WEIGHT BEARING RESTRICTIONS: No  FALLS:  Has patient fallen in last 6 months? Yes. Number of falls 1  LIVING ENVIRONMENT: Lives with: lives alone Lives in: House/apartment Stairs:  Elevator  Has following equipment at home: None  OCCUPATION: SLP  PLOF: Independent  PATIENT GOALS: Endurance, working without feeling fatigued/ tired, taking the dog for a walk without issues.  To be able to stand for 30 min without leaning or feeling fatigued following a swallow exam.    OBJECTIVE:  Note: Objective measures were completed at Evaluation unless otherwise noted.  DIAGNOSTIC FINDINGS:  See chart  PATIENT SURVEYS:  FOTO 64%, predicted 67% 05/26/23 49% (pt noted she didn't answer the intake questions honestly)  COGNITION: Overall cognitive status: Within functional limits for tasks assessed      POSTURE: rounded shoulders, forward head, and flexed trunk   PALPATION: TTP along the R >L scalenes/ SCM, upper trap/ levator scapulae.   CERVICAL ROM:   Active ROM A/PROM (deg) eval  Flexion 60  Extension 40  Right lateral flexion 56  Left lateral flexion 52  Right rotation 40  Left rotation 20   (Blank rows = not tested)  LOWER EXTREMITY ROM:     Active  Right eval Left eval  Hip flexion [facility]  Hip extension [facility]  Hip abduction [facility]  Hip  adduction [facility]  Hip internal rotation [facility]  Hip external rotation [facility]  Knee flexion [facility]  Knee extension [facility]  Ankle dorsiflexion [facility]  Ankle plantarflexion    Ankle inversion    Ankle eversion     (Blank rows = not tested)  LOWER EXTREMITY MMT:    MMT Right eval Left eval Right 05/26/23 Left 05/26/23 Rt 07/21/23 Lt.  07/21/23  Hip flexion 4 4 4+ 4 4+ 4+  Hip extension 3+ 3+ 4- 4-    Hip abduction 3+ 3+ 4- 4-    Hip adduction 4 4 4 4     Hip internal rotation 4- 4- 4+ 4+    Hip external rotation 4- 4- 4+ 4+    Knee flexion 4- 4 4 4  4+ 4+  Knee extension 4- 4 4 4  4+ 4+  Ankle dorsiflexion 4- 4- 4+ 4+ 5 5  Ankle plantarflexion 4 4      Ankle inversion 4 4      Ankle eversion 4 4       (Blank rows = not tested)  UPPER EXTREMITY MMT:  MMT Right eval Left eval Right 05/26/23 Left 05/26/23 Rt./Lt.  07/21/23  Shoulder flexion 4- 3+ 4 4- 4/5 bilateral  Shoulder extension 4 4 4+ 4+   Shoulder abduction 3+ 3+ 4- 4- 4/5 bilateral  Shoulder adduction       Shoulder extension       Shoulder internal rotation 4 4     Shoulder external rotation 4- 3+ 4- 3+   Middle trapezius 4- 4- 4 4   Lower trapezius 4- 4- 4 4   Elbow flexion 4- 4- 4- 4-   Elbow extension 4 4 4 4    Wrist flexion       Wrist extension       Wrist ulnar deviation       Wrist radial deviation       Wrist pronation       Wrist supination       Grip strength        (Blank rows = not tested)  UPPER EXTREMITY ROM:  Active ROM Right eval Left eval  Shoulder flexion [facility]  Shoulder extension [facility]  Shoulder abduction [facility] W[facility]  Shoulder adduction [facility] W[facility]  Shoulder extension [facility]  Shoulder internal rotation    Shoulder external rotation    Elbow flexion    Elbow extension    Wrist flexion    Wrist extension    Wrist ulnar deviation    Wrist radial deviation    Wrist pronation    Wrist supination     (Blank rows = not tested)   Note: Core strength 3/5 in supine with (with arms  extended)  05/26/23: Core strength 3+/5  FUNCTIONAL TESTS:  30 seconds chair stand test 7 reps  GAIT: Distance walked: 100 ft to treatment area Assistive device utilized: None Level of assistance: Complete Independence Comments: Flexed trunk, varying stride lengths bil and postural sway noted  TREATMENT  [facility] Adult PT Treatment:                                                DATE: 07/21/23 BP 142/101, 145/104 (took double dose of meds already)  RHR 82  90/90 hold 90/90 reverse toe taps Hip abd, MMT Bridge x 10  Single leg x 5  Squat 10 sec x 10  Squat 5 sec hold 15 lbs  BP after squats 156/96 , HR 97    [facility] Adult  PT Treatment:                                                DATE: 07/14/23 Vitals:   07/14/23 1020 07/14/23 1056  BP: (!) 138/99 (!) 132/94  Pulse: 79 79  SpO2: 98% 98%   Stepper L2 x 2 min Sustained mini lunge x 30 sec ( mod postural sway)  Sustained lunge/ squat rolling weighted yellow ball to mimic swallow study. Dead lift from floor x 10 with 5# KB, progressed to 4 inch step with 5# KB Standing with lead leg on 4 inch step with weighted diagonal movement with blue ball 2 x 10 bil (switching lead leg)  [facility] Adult PT Treatment:                                                DATE: 07/07/23  Elliptical L2 x 5 min Standing on airex pad reaching outside BOS.  Progressed to cross body reaching tapping cones Standing on airex pad with pertubations via ball tosse outside of BOS Squating grabbing items off the floor Posture grabbing items close by to maximize stability Sit to stand with 10# KB going to fatigue   [facility] Adult PT Treatment:                                                DATE: 06/30/23 Vitals:   06/30/23 1027 06/30/23 1101  BP: (!) 128/96 (!) 124/96  Pulse: 72 73  SpO2: 96% 97%   Ellitipical L2 x 6 min ramp L1 Wood rocker board DF/PF 2 x 20  in // Standing on rocker board avoiding sides from touching on the ground with head turns laterally and  up/ down Tandem walking on airex beam forward/ backward in // Sit to stand with 10# weight 2 x 12- compensates with fatigue by internally rotating R hip.  Corner dynamic balance with marching in place on airex pad Reviwed HEP/ balance exercises at home.    PATIENT EDUCATION: 06/02/23 Education details: importance of focusing on breathing during exercise and effects not breathing efficiently and how it can impact function.  Person educated: Patient Education method: Explanation, Demonstration, Verbal cues, and Handouts Education comprehension: verbalized understanding  HOME EXERCISE PROGRAM: Access Code: [email or web address] URL: [email or web address] Date: 05/26/2023 Prepared by: Laron Plummer  Exercises - supine bridge w/ brace  - 1 x daily - 7 x weekly - 2 sets - 10 reps - SLR  - 1 x daily - 7 x weekly - 2 sets - 10 reps - 1 hold - Sidelying Hip Abduction  - 1 x daily - 7 x weekly - 2 sets - 10 reps - Seated Transversus Abdominis Bracing  - 1 x daily - 7 x weekly - 2 sets - 10 reps - 5 second hold - Scapular retraction with ER (MONEY)  - 1 x daily - 7 x weekly - 2 sets - 10 reps - Seated Gentle Upper Trapezius Stretch  - 1 x daily - 7 x weekly - 1 sets - 3 reps - 30 hold - Gentle Levator Scapulae Stretch  -  1 x daily - 7 x weekly - 3 sets - 3 reps - 30 hold - Standing Shoulder Row with Anchored Resistance  - 1 x daily - 7 x weekly - 2 sets - 10 reps - Seated Abdominal Press into [employer]  - 1 x daily - 7 x weekly - 2 sets - 10 reps - 3-5 seconds hold - Elbow Extension with Resistance  - 1 x daily - 7 x weekly - 2 sets - 12 reps  ASSESSMENT:  CLINICAL IMPRESSION: 07/21/2023 Patient was renewed for 8 more weeks of PT.  She has noted improvement in her tolerance for activity.  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  Her biggest limiting factor is her ability to stand for long periods due to dizziness, fatigue and increased postural sway.  She does her  HEP everyday in its entirety.  Patient continues to need skilled PT in order to maintain her functional mobility and build strength in large muscle groups to support her cardiovascular system.   EVAL- Patient is a 33 y.o. F who was seen today for physical therapy evaluation and treatment for General weakness following her Right ventriculoateral shunt revision on 02/22/2023. She demonstrate function ROM with the exception of limited cervical L rotation. She demonstrates gross weakness in both bil UE/LE, seated /standing she demonstrates postural sway.  She reports have 1 fall down a flight of steps noting she only had bruises and didn't hit her head but also didn't notify her MD or neurosurgeon. Vitals were assessed before and after session with BP starting at 141/97 and exhibited improvement at end of session following exercise in both supine and seated  position. Roberta [name] reports challenges with endurance and fatigue and noted it has been more challenging this week due to return to work full status and had worked 3 consecutive 10 hours shifts. She would benefit from physical therapy to decrease neck stiffness/ tenderness, improve gross UE/LE strength, maximize stability and balance.   OBJECTIVE IMPAIRMENTS: decreased activity tolerance, decreased endurance, decreased ROM, decreased strength, improper body mechanics, and postural dysfunction.   ACTIVITY LIMITATIONS: carrying, lifting, bending, standing, squatting, reach over head, and locomotion level  PARTICIPATION LIMITATIONS: shopping, community activity, and occupation  PERSONAL FACTORS: Behavior pattern, Past/current experiences, Time since onset of injury/illness/exacerbation, and 1-2 comorbidities: POTS, EDS  are also affecting patient's functional outcome.   REHAB POTENTIAL: Good  CLINICAL DECISION MAKING: Evolving/moderate complexity  EVALUATION COMPLEXITY: Moderate   GOALS: Goals reviewed with patient? Yes  SHORT TERM GOALS: Target date:  04/28/2023  Pt to be IND with initial HEP for therapeutic progression Baseline: Goal status: MET 05/19/23  LONG TERM GOALS: Target date: 09/15/2023    I'm gross UE strength to >/= 4/5 to promote stablity lifting/ carrying activities / tasks Baseline:  Goal status: Ongoing 07/21/23  2.  Improve gross LE strnegth to >/= 4/5 to assist with lifting mechanics and maintaining posture and stability  Baseline:  Goal status: Ongoing 07/21/23  3.  Improve FOTO score to >/= 68% to demo improvement in function Baseline:  Goal status: Ongoing 07/21/23  4.  Pt to be able to work a 10 hour day performing regular work tasks reporting </= min fatigue to demonstrate improvement in endurance. Baseline:  Status reports: moderate Goal status: Ongoing 07/21/23  5.  Improve core stength to >/= 4/5 to promote trunk stability/ reduce postural sway and maximize safety with work and ADLs Baseline:  Goal status: Ongoing 07/21/23  6.  Pt to be IND with all HEP and will be able to maintain and progress her current LOF IND Baseline:  Goal status: Ongoing 07/21/23  7. Pt to be able stand for the entire session >/= 30 minutes and not need to rely on leaning without feeling off balance or unstable during work related tasks as well as personal ADLs per her personal goal .  Baseline:  Goal status: ongoing  PLAN:  PT FREQUENCY: 1-2x/week  PT DURATION: 8 weeks  PLANNED INTERVENTIONS: 97164- PT Re-evaluation, 97110-Therapeutic exercises, 97530- Therapeutic activity, 97112- Neuromuscular re-education, 97535- Self Care, 78469- Manual therapy, 97116- Gait training, 97014- Electrical stimulation (unattended), Patient/Family education, Dry Needling, Cryotherapy, and Moist heat.  PLAN FOR NEXT SESSION: Review/ update HEP. Gross strengthening/ endurance training. Continued EMOMs progress with balance training per pt request, core activation. Gave goal performing swallow study at work without relying on leaning on the bed for 3:30  and after taking a short break to work on getting an additional 1:30 without leaning. Practicing a sustained squat.   Marci Setter, PT 07/21/23 12:20 PM Phone: [phone] Fax: [phone]

## 2023-07-21 ENCOUNTER — Encounter: Payer: Self-pay | Admitting: Physical Therapy

## 2023-07-21 ENCOUNTER — Ambulatory Visit: Attending: Registered Nurse | Admitting: Physical Therapy

## 2023-07-21 DIAGNOSIS — M357 Hypermobility syndrome: Secondary | ICD-10-CM | POA: Diagnosis present

## 2023-07-21 DIAGNOSIS — R42 Dizziness and giddiness: Secondary | ICD-10-CM | POA: Diagnosis present

## 2023-07-21 DIAGNOSIS — G90A Postural orthostatic tachycardia syndrome (POTS): Secondary | ICD-10-CM | POA: Diagnosis present

## 2023-07-21 DIAGNOSIS — M6281 Muscle weakness (generalized): Secondary | ICD-10-CM | POA: Insufficient documentation

## 2023-07-28 ENCOUNTER — Ambulatory Visit: Admitting: Physical Therapy

## 2023-07-28 VITALS — BP 118/88 | HR 70

## 2023-07-28 DIAGNOSIS — M6281 Muscle weakness (generalized): Secondary | ICD-10-CM | POA: Diagnosis not present

## 2023-07-28 NOTE — Therapy (Signed)
 OUTPATIENT PHYSICAL THERAPY THORACOLUMBAR TREATMENT    Patient Name: Roberta [name] MRN: [id number] DOB:1990-09-24, 33 y.o., female Today's Date: 07/28/2023  END OF SESSION:  PT End of Session - 07/28/23 1011     Visit Number 16    Number of Visits 23    Date for PT Re-Evaluation 09/15/23    Authorization Type cigna [id number]    PT Start Time 1019    Activity Tolerance Patient tolerated treatment well;Patient limited by fatigue    Behavior During Therapy [facility] for tasks assessed/performed                       Past Medical History:  Diagnosis Date   Anxiety    Depression    Dumping syndrome    Ehlers-Danlos syndrome    Postural orthostatic tachycardia syndrome    Past Surgical History:  Procedure Laterality Date   BIOPSY  11/20/2020   Procedure: BIOPSY;  Surgeon: Lanita Pitman, MD;  Location: [facility];  Service: Endoscopy;;   ESOPHAGOGASTRODUODENOSCOPY N/A 11/20/2020   Procedure: ESOPHAGOGASTRODUODENOSCOPY (EGD);  Surgeon: Lanita Pitman, MD;  Location: [facility];  Service: Endoscopy;  Laterality: N/A;   INNER EAR SURGERY     Right ventriculoateral shunt revision Right 2024   UPPER GI ENDOSCOPY     Patient Active Problem List   Diagnosis Date Noted   Generalized anxiety disorder 07/02/2020   Dry heaves 03/28/2020   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, involving the cardia 03/28/2020   Nausea and vomiting 03/28/2020   Ehlers-Danlos disease 11/22/2019   Chronic eczematous otitis externa of both ears 12/07/2018   Excessive cerumen in both ear canals 12/07/2018   POTS (postural orthostatic tachycardia syndrome) 11/04/2014   Benign essential hypertension 10/10/2014   Lymphadenopathy 02/10/2011    PCP: Imelda Man, MD  REFERRING PROVIDER:  Jhon Moselle, FNP   REFERRING DIAG: Post op weakness  Rationale for Evaluation and Treatment: Rehabilitation  THERAPY DIAG:  Muscle weakness (generalized)  ONSET DATE:  02/22/2023  SUBJECTIVE:                                                                                                                                                                                           SUBJECTIVE STATEMENT: 07/28/2023 "I'm doing good, no falls or anything like that."   EVAL [name] to PT following Right ventriculoateral shunt revision that occurred on 02/22/2023. She was readmitted after she was d/c due excessive N/V eventually D/C 3 days later. Since she was discharged she reports feeling overall fatigued and weak. She did report falling down a flight of steps (10 steps) on 12/15, but  notes she didn't hurt her head oranything else and notes she was just bruised, but didn't mention this to her MD. 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   PERTINENT HISTORY:  See flow Dr. Denton Flakes visit this week 07/20/23: The case was discussed with the patient at length and she was reassured regarding her cardiac status. The patient was advised to continue her current medical regimen. However she was instructed to uptitrate her dosage of guanfacine to 1 mg twice daily gradually with close follow-up of her blood pressure. 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 The patient will be initiated on acarbose 25 mg before every meal for potential management of her GI symptomatology. She was instructed to contact our office through MyChart with an update regarding her clinical status in 4 weeks. Meanwhile, she was once again counseled regarding liberal fluid and salt intake, especially during the summertime. The patient was also counseled regarding daily aerobic exercises targeted to the lower body; small, frequent, low  carbohydrate and high-protein meals; and the use of compression garments. I do not recommend any further cardiac workup at this time and the patient will continue to follow-up with her primary care physician. The patient will return to our office for a follow-up appointment in 6 months.  PAIN:  Are you having pain? No  PRECAUTIONS: Fall  RED FLAGS: None   WEIGHT BEARING RESTRICTIONS: No  FALLS:  Has patient fallen in last 6 months? Yes. Number of falls 1  LIVING ENVIRONMENT: Lives with: lives alone Lives in: House/apartment Stairs: Elevator  Has following equipment at home: None  OCCUPATION: SLP  PLOF: Independent  PATIENT GOALS: Endurance, working without feeling fatigued/ tired, taking the dog for a walk without issues.  To be able to stand for 30 min without leaning or feeling fatigued following a swallow exam.    OBJECTIVE:  Note: Objective measures were completed at Evaluation unless otherwise noted.  DIAGNOSTIC FINDINGS:  See chart  PATIENT SURVEYS:  FOTO 64%, predicted 67% 05/26/23 49% (pt noted she didn't answer the intake questions honestly)  COGNITION: Overall cognitive status: Within functional limits for tasks assessed      POSTURE: rounded shoulders, forward head, and flexed trunk   PALPATION: TTP along the R >L scalenes/ SCM, upper trap/ levator scapulae.   CERVICAL ROM:   Active ROM A/PROM (deg) eval  Flexion 60  Extension 40  Right lateral flexion 56  Left lateral flexion 52  Right rotation 40  Left rotation 20   (Blank rows = not tested)  LOWER EXTREMITY ROM:     Active  Right eval Left eval  Hip flexion [facility]  Hip extension [facility]  Hip abduction [facility]  [facility]ip adduction [facility]  Hip internal rotation [facility]  Hip external rotation [facility]  Knee flexion [facility]  Knee extension [facility]  Ankle dorsiflexion [facility]  Ankle plantarflexion    Ankle inversion    Ankle eversion     (Blank rows = not tested)  LOWER EXTREMITY MMT:     MMT Right eval Left eval Right 05/26/23 Left 05/26/23 Rt 07/21/23 Lt.  07/21/23  Hip flexion 4 4 4+ 4 4+ 4+  Hip extension 3+ 3+ 4- 4-  Hip abduction 3+ 3+ 4- 4-    Hip adduction 4 4 4 4     Hip internal rotation 4- 4- 4+ 4+    Hip external rotation 4- 4- 4+ 4+    Knee flexion 4- 4 4 4  4+ 4+  Knee extension 4- 4 4 4  4+ 4+  Ankle dorsiflexion 4- 4- 4+ 4+ 5 5  Ankle plantarflexion 4 4      Ankle inversion 4 4      Ankle eversion 4 4       (Blank rows = not tested)  UPPER EXTREMITY MMT:  MMT Right eval Left eval Right 05/26/23 Left 05/26/23 Rt./Lt.  07/21/23  Shoulder flexion 4- 3+ 4 4- 4/5 bilateral  Shoulder extension 4 4 4+ 4+   Shoulder abduction 3+ 3+ 4- 4- 4/5 bilateral  Shoulder adduction       Shoulder extension       Shoulder internal rotation 4 4     Shoulder external rotation 4- 3+ 4- 3+   Middle trapezius 4- 4- 4 4   Lower trapezius 4- 4- 4 4   Elbow flexion 4- 4- 4- 4-   Elbow extension 4 4 4 4    Wrist flexion       Wrist extension       Wrist ulnar deviation       Wrist radial deviation       Wrist pronation       Wrist supination       Grip strength        (Blank rows = not tested)  UPPER EXTREMITY ROM:  Active ROM Right eval Left eval  Shoulder flexion [facility]  Shoulder extension [facility]  Shoulder abduction [facility] WFL  Shoulder adduction [facility] WFL  Shoulder extension [facility]  Shoulder internal rotation    Shoulder external rotation    Elbow flexion    Elbow extension    Wrist flexion    Wrist extension    Wrist ulnar deviation    Wrist radial deviation    Wrist pronation    Wrist supination     (Blank rows = not tested)   Note: Core strength 3/5 in supine with (with arms extended)  05/26/23: Core strength 3+/5  FUNCTIONAL TESTS:  30 seconds chair stand test 7 reps  GAIT: Distance walked: 100 ft to treatment area Assistive device utilized: None Level of assistance: Complete Independence Comments: Flexed trunk, varying stride lengths bil  and postural sway noted  TREATMENT [facility] Adult PT Treatment:                                                DATE: 07/28/23 Vitals:   07/28/23 1021 07/28/23 1053  BP: 110/70 118/88  Pulse: (!) 57 70  SpO2: 99% 98%    Stepper l1 x 2 min (cues for smooth slow controlled consisency) Standing lunges 2 x 10 bil in // Rhomberg balance 3 x 30 sec in // Sit to stand with 3 sec hoover before sitting. 2 x 5  [facility] Adult PT Treatment:                                                DATE: 07/21/23 BP 142/101,  145/104 (took double dose of meds already)  RHR 82  90/90 hold 90/90 reverse toe taps Hip abd, MMT Bridge x 10  Single leg x 5  Squat 10 sec x 10  Squat 5 sec hold 15 lbs  BP after squats 156/96 , HR 97    [facility] Adult PT Treatment:                                                DATE: 07/14/23 Vitals:   07/14/23 1020 07/14/23 1056  BP: (!) 138/99 (!) 132/94  Pulse: 79 79  SpO2: 98% 98%   Stepper L2 x 2 min Sustained mini lunge x 30 sec ( mod postural sway)  Sustained lunge/ squat rolling weighted yellow ball to mimic swallow study. Dead lift from floor x 10 with 5# KB, progressed to 4 inch step with 5# KB Standing with lead leg on 4 inch step with weighted diagonal movement with blue ball 2 x 10 bil (switching lead leg)  PATIENT EDUCATION: 06/02/23 Education details: importance of focusing on breathing during exercise and effects not breathing efficiently and how it can impact function.  Person educated: Patient Education method: Explanation, Demonstration, Verbal cues, and Handouts Education comprehension: verbalized understanding  HOME EXERCISE PROGRAM: Access Code: [email or web address] URL: [email or web address] Date: 05/26/2023 Prepared by: Laron Plummer  Exercises - supine bridge w/ brace  - 1 x daily - 7 x weekly - 2 sets - 10 reps - SLR  - 1 x daily - 7 x weekly - 2 sets - 10 reps - 1 hold - Sidelying Hip Abduction  - 1 x daily - 7 x weekly - 2 sets - 10 reps -  Seated Transversus Abdominis Bracing  - 1 x daily - 7 x weekly - 2 sets - 10 reps - 5 second hold - Scapular retraction with ER (MONEY)  - 1 x daily - 7 x weekly - 2 sets - 10 reps - Seated Gentle Upper Trapezius Stretch  - 1 x daily - 7 x weekly - 1 sets - 3 reps - 30 hold - Gentle Levator Scapulae Stretch  - 1 x daily - 7 x weekly - 3 sets - 3 reps - 30 hold - Standing Shoulder Row with Anchored Resistance  - 1 x daily - 7 x weekly - 2 sets - 10 reps - Seated Abdominal Press into [employer]  - 1 x daily - 7 x weekly - 2 sets - 10 reps - 3-5 seconds hold - Elbow Extension with Resistance  - 1 x daily - 7 x weekly - 2 sets - 12 reps  ASSESSMENT:  CLINICAL IMPRESSION: 5/8/2025Mega [name] to PT today noting changes in her medication which has helpd her BP but is making her feel dizzy more frequently. Measured her BP today which she was lower than she usually is at. Today she demonstrated increased postural sway and reports of dizziness, modified session to help with rest breaks and help maximize safety. End of session she noted feeling better.   EVAL- Patient is a 33 y.o. F who was seen today for physical therapy evaluation and treatment for General weakness following her Right ventriculoateral shunt revision on 02/22/2023. She demonstrate function ROM with the exception of limited cervical L rotation. She demonstrates gross weakness in both bil UE/LE, seated /standing she demonstrates postural sway.  She reports have  1 fall down a flight of steps noting she only had bruises and didn't hit her head but also didn't notify her MD or neurosurgeon. Vitals were assessed before and after session with BP starting at 141/97 and exhibited improvement at end of session following exercise in both supine and seated  position. Roberta [name] reports challenges with endurance and fatigue and noted it has been more challenging this week due to return to work full status and had worked 3 consecutive 10 hours shifts. She would  benefit from physical therapy to decrease neck stiffness/ tenderness, improve gross UE/LE strength, maximize stability and balance.   OBJECTIVE IMPAIRMENTS: decreased activity tolerance, decreased endurance, decreased ROM, decreased strength, improper body mechanics, and postural dysfunction.   ACTIVITY LIMITATIONS: carrying, lifting, bending, standing, squatting, reach over head, and locomotion level  PARTICIPATION LIMITATIONS: shopping, community activity, and occupation  PERSONAL FACTORS: Behavior pattern, Past/current experiences, Time since onset of injury/illness/exacerbation, and 1-2 comorbidities: POTS, EDS are also affecting patient's functional outcome.   REHAB POTENTIAL: Good  CLINICAL DECISION MAKING: Evolving/moderate complexity  EVALUATION COMPLEXITY: Moderate   GOALS: Goals reviewed with patient? Yes  SHORT TERM GOALS: Target date: 04/28/2023  Pt to be IND with initial HEP for therapeutic progression Baseline: Goal status: MET 05/19/23  LONG TERM GOALS: Target date: 09/15/2023    I'm gross UE strength to >/= 4/5 to promote stablity lifting/ carrying activities / tasks Baseline:  Goal status: Ongoing 07/21/23  2.  Improve gross LE strnegth to >/= 4/5 to assist with lifting mechanics and maintaining posture and stability  Baseline:  Goal status: Ongoing 07/21/23  3.  Improve FOTO score to >/= 68% to demo improvement in function Baseline:  Goal status: Ongoing 07/21/23  4.  Pt to be able to work a 10 hour day performing regular work tasks reporting </= min fatigue to demonstrate improvement in endurance. Baseline:  Status reports: moderate Goal status: Ongoing 07/21/23  5.  Improve core stength to >/= 4/5 to promote trunk stability/ reduce postural sway and maximize safety with work and ADLs Baseline:  Goal status: Ongoing 07/21/23  6.  Pt to be IND with all HEP and will be able to maintain and progress her current LOF IND Baseline:  Goal status: Ongoing 07/21/23  7.  Pt to be able stand for the entire session >/= 30 minutes and not need to rely on leaning without feeling off balance or unstable during work related tasks as well as personal ADLs per her personal goal .  Baseline:  Goal status: ongoing  PLAN:  PT FREQUENCY: 1-2x/week  PT DURATION: 8 weeks  PLANNED INTERVENTIONS: 97164- PT Re-evaluation, 97110-Therapeutic exercises, 97530- Therapeutic activity, 97112- Neuromuscular re-education, 97535- Self Care, 84132- Manual therapy, 97116- Gait training, 97014- Electrical stimulation (unattended), Patient/Family education, Dry Needling, Cryotherapy, and Moist heat.  PLAN FOR NEXT SESSION: Review/ update HEP. Gross strengthening/ endurance training. Continued EMOMs progress with balance training per pt request, core activation. Gave goal performing swallow study at work without relying on leaning on the bed for 3:30 and after taking a short break to work on getting an additional 1:30 without leaning. Practicing a sustained squat.   Roberta [name] PT, DPT, LAT, ATC  07/28/23  11:01 AM

## 2023-08-03 NOTE — Therapy (Incomplete)
 OUTPATIENT PHYSICAL THERAPY THORACOLUMBAR TREATMENT    Patient Name: Roberta [name] MRN: [id number] DOB:1990-04-23, 33 y.o., female Today's Date: 08/03/2023  END OF SESSION:              Past Medical History:  Diagnosis Date   Anxiety    Depression    Dumping syndrome    Ehlers-Danlos syndrome    Postural orthostatic tachycardia syndrome    Past Surgical History:  Procedure Laterality Date   BIOPSY  11/20/2020   Procedure: BIOPSY;  Surgeon: Lanita Pitman, MD;  Location: [facility];  Service: Endoscopy;;   ESOPHAGOGASTRODUODENOSCOPY N/A 11/20/2020   Procedure: ESOPHAGOGASTRODUODENOSCOPY (EGD);  Surgeon: Lanita Pitman, MD;  Location: [facility];  Service: Endoscopy;  Laterality: N/A;   INNER EAR SURGERY     Right ventriculoateral shunt revision Right 2024   UPPER GI ENDOSCOPY     Patient Active Problem List   Diagnosis Date Noted   Generalized anxiety disorder 07/02/2020   Dry heaves 03/28/2020   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  ONSET DATE: 02/22/2023  SUBJECTIVE:                                                                                                                                                                                           SUBJECTIVE STATEMENT: 08/03/2023 ***   EVAL Patient arrives to PT following Right ventriculoateral shunt revision that occurred on 02/22/2023. She was  readmitted after she was d/c due excessive N/V eventually D/C 3 days later. Since she was discharged she reports feeling overall fatigued and weak. She did report falling down a flight of steps (10 steps) on 12/15, but notes she didn't hurt her head oranything else and notes she was just bruised, but didn't mention this to her MD. 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   PERTINENT HISTORY:  See flow Dr. Denton Flakes visit this week 07/20/23: The case was discussed with the patient  at length and she was reassured regarding her cardiac status. The patient was advised to continue her current medical regimen. However she was instructed to uptitrate her dosage of guanfacine to 1 mg twice daily gradually with close follow-up of her blood pressure. 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 The patient will be initiated on acarbose 25 mg before every meal for potential management of her GI symptomatology. She was instructed to contact our office through MyChart with an update regarding her clinical status in 4 weeks. Meanwhile, she was once again counseled regarding liberal fluid and salt intake, especially during the summertime. The patient was also counseled regarding daily aerobic exercises targeted to the lower body; small, frequent, low carbohydrate and high-protein meals; and the use of compression garments. I do not recommend any further cardiac workup at this time and the patient will continue to follow-up with her primary care physician. The patient will return to our office for a follow-up appointment in 6 months.  PAIN:  Are you having pain? No  PRECAUTIONS: Fall  RED FLAGS: None   WEIGHT BEARING RESTRICTIONS: No  FALLS:  Has  patient fallen in last 6 months? Yes. Number of falls 1  LIVING ENVIRONMENT: Lives with: lives alone Lives in: House/apartment Stairs: Elevator  Has following equipment at home: None  OCCUPATION: SLP  PLOF: Independent  PATIENT GOALS: Endurance, working without feeling fatigued/ tired, taking the dog for a walk without issues.  To be able to stand for 30 min without leaning or feeling fatigued following a swallow exam.    OBJECTIVE:  Note: Objective measures were completed at Evaluation unless otherwise noted.  DIAGNOSTIC FINDINGS:  See chart  PATIENT SURVEYS:  FOTO 64%, predicted 67% 05/26/23 49% (pt noted she didn't answer the intake questions honestly)  COGNITION: Overall cognitive status: Within functional limits for tasks assessed      POSTURE: rounded shoulders, forward head, and flexed trunk   PALPATION: TTP along the R >L scalenes/ SCM, upper trap/ levator scapulae.   CERVICAL ROM:   Active ROM A/PROM (deg) eval  Flexion 60  Extension 40  Right lateral flexion 56  Left lateral flexion 52  Right rotation 40  Left rotation 20   (Blank rows = not tested)  LOWER EXTREMITY ROM:     Active  Right eval Left eval  Hip flexion [facility]  Hip extension [facility]  Hip abduction [facility]  Hip adduction [facility]  Hip internal rotation [facility]  Hip external rotation [facility]  Knee flexion [facility]  Knee extension [facility]  Ankle dorsiflexion [facility]  Ankle plantarflexion    Ankle inversion    Ankle eversion     (Blank rows = not tested)  LOWER EXTREMITY MMT:    MMT Right eval Left eval Right 05/26/23 Left 05/26/23 Rt 07/21/23 Lt.  07/21/23  Hip flexion 4 4 4+ 4 4+ 4+  Hip extension 3+ 3+ 4- 4-    Hip abduction 3+ 3+ 4- 4-    Hip adduction 4 4 4 4     Hip internal rotation 4- 4- 4+ 4+    Hip external rotation 4- 4- 4+ 4+    Knee flexion 4- 4 4 4  4+ 4+  Knee extension 4- 4 4 4  4+ 4+  Ankle dorsiflexion 4- 4- 4+ 4+ 5 5  Ankle plantarflexion 4 4      Ankle  inversion 4 4  Ankle eversion 4 4       (Blank rows = not tested)  UPPER EXTREMITY MMT:  MMT Right eval Left eval Right 05/26/23 Left 05/26/23 Rt./Lt.  07/21/23  Shoulder flexion 4- 3+ 4 4- 4/5 bilateral  Shoulder extension 4 4 4+ 4+   Shoulder abduction 3+ 3+ 4- 4- 4/5 bilateral  Shoulder adduction       Shoulder extension       Shoulder internal rotation 4 4     Shoulder external rotation 4- 3+ 4- 3+   Middle trapezius 4- 4- 4 4   Lower trapezius 4- 4- 4 4   Elbow flexion 4- 4- 4- 4-   Elbow extension 4 4 4 4    Wrist flexion       Wrist extension       Wrist ulnar deviation       Wrist radial deviation       Wrist pronation       Wrist supination       Grip strength        (Blank rows = not tested)  UPPER EXTREMITY ROM:  Active ROM Right eval Left eval  Shoulder flexion [facility]  Shoulder extension [facility]  Shoulder abduction [facility] WFL  Shoulder adduction [facility] W[facility]  Shoulder extension [facility]  Shoulder internal rotation    Shoulder external rotation    Elbow flexion    Elbow extension    Wrist flexion    Wrist extension    Wrist ulnar deviation    Wrist radial deviation    Wrist pronation    Wrist supination     (Blank rows = not tested)   Note: Core strength 3/5 in supine with (with arms extended)  05/26/23: Core strength 3+/5  FUNCTIONAL TESTS:  30 seconds chair stand test 7 reps  GAIT: Distance walked: 100 ft to treatment area Assistive device utilized: None Level of assistance: Complete Independence Comments: Flexed trunk, varying stride lengths bil and postural sway noted  TREATMENT [facility] Adult PT Treatment:                                                DATE: 08/03/23  ***  [facility] Adult PT Treatment:                                                DATE: 07/28/23 Vitals:   07/28/23 1021 07/28/23 1053  BP: 110/70 118/88  Pulse: (!) 57 70  SpO2: 99% 98%    Stepper l1 x 2 min (cues for smooth slow controlled consisency) Standing lunges 2 x 10 bil in  // Rhomberg balance 3 x 30 sec in // Sit to stand with 3 sec hoover before sitting. 2 x 5  [facility] Adult PT Treatment:                                                DATE: 07/21/23 BP 142/101, 145/104 (took double dose of meds already)  RHR 82  90/90 hold 90/90 reverse toe taps Hip abd, MMT Bridge x 10  Single leg x  5  Squat 10 sec x 10  Squat 5 sec hold 15 lbs  BP after squats 156/96 , HR 97  PATIENT EDUCATION: 06/02/23 Education details: importance of focusing on breathing during exercise and effects not breathing efficiently and how it can impact function.  Person educated: Patient Education method: Explanation, Demonstration, Verbal cues, and Handouts Education comprehension: verbalized understanding  HOME EXERCISE PROGRAM: Access Code: [email or web address] URL: [email or web address] Date: 05/26/2023 Prepared by: Laron Plummer  Exercises - supine bridge w/ brace  - 1 x daily - 7 x weekly - 2 sets - 10 reps - SLR  - 1 x daily - 7 x weekly - 2 sets - 10 reps - 1 hold - Sidelying Hip Abduction  - 1 x daily - 7 x weekly - 2 sets - 10 reps - Seated Transversus Abdominis Bracing  - 1 x daily - 7 x weekly - 2 sets - 10 reps - 5 second hold - Scapular retraction with ER (MONEY)  - 1 x daily - 7 x weekly - 2 sets - 10 reps - Seated Gentle Upper Trapezius Stretch  - 1 x daily - 7 x weekly - 1 sets - 3 reps - 30 hold - Gentle Levator Scapulae Stretch  - 1 x daily - 7 x weekly - 3 sets - 3 reps - 30 hold - Standing Shoulder Row with Anchored Resistance  - 1 x daily - 7 x weekly - 2 sets - 10 reps - Seated Abdominal Press into [employer]  - 1 x daily - 7 x weekly - 2 sets - 10 reps - 3-5 seconds hold - Elbow Extension with Resistance  - 1 x daily - 7 x weekly - 2 sets - 12 reps  ASSESSMENT:  CLINICAL IMPRESSION: 08/03/2023***  EVAL- Patient is a 33 y.o. F who was seen today for physical therapy evaluation and treatment for General weakness following her Right ventriculoateral  shunt revision on 02/22/2023. She demonstrate function ROM with the exception of limited cervical L rotation. She demonstrates gross weakness in both bil UE/LE, seated /standing she demonstrates postural sway.  She reports have 1 fall down a flight of steps noting she only had bruises and didn't hit her head but also didn't notify her MD or neurosurgeon. Vitals were assessed before and after session with BP starting at 141/97 and exhibited improvement at end of session following exercise in both supine and seated  position. Dion reports challenges with endurance and fatigue and noted it has been more challenging this week due to return to work full status and had worked 3 consecutive 10 hours shifts. She would benefit from physical therapy to decrease neck stiffness/ tenderness, improve gross UE/LE strength, maximize stability and balance.   OBJECTIVE IMPAIRMENTS: decreased activity tolerance, decreased endurance, decreased ROM, decreased strength, improper body mechanics, and postural dysfunction.   ACTIVITY LIMITATIONS: carrying, lifting, bending, standing, squatting, reach over head, and locomotion level  PARTICIPATION LIMITATIONS: shopping, community activity, and occupation  PERSONAL FACTORS: Behavior pattern, Past/current experiences, Time since onset of injury/illness/exacerbation, and 1-2 comorbidities: POTS, EDS are also affecting patient's functional outcome.   REHAB POTENTIAL: Good  CLINICAL DECISION MAKING: Evolving/moderate complexity  EVALUATION COMPLEXITY: Moderate   GOALS: Goals reviewed with patient? Yes  SHORT TERM GOALS: Target date: 04/28/2023  Pt to be IND with initial HEP for therapeutic progression Baseline: Goal status: MET 05/19/23  LONG TERM GOALS: Target date: 09/15/2023    I'm gross UE strength to >/= 4/5 to promote stablity lifting/ carrying  activities / tasks Baseline:  Goal status: Ongoing 07/21/23  2.  Improve gross LE strnegth to >/= 4/5 to assist with  lifting mechanics and maintaining posture and stability  Baseline:  Goal status: Ongoing 07/21/23  3.  Improve FOTO score to >/= 68% to demo improvement in function Baseline:  Goal status: Ongoing 07/21/23  4.  Pt to be able to work a 10 hour day performing regular work tasks reporting </= min fatigue to demonstrate improvement in endurance. Baseline:  Status reports: moderate Goal status: Ongoing 07/21/23  5.  Improve core stength to >/= 4/5 to promote trunk stability/ reduce postural sway and maximize safety with work and ADLs Baseline:  Goal status: Ongoing 07/21/23  6.  Pt to be IND with all HEP and will be able to maintain and progress her current LOF IND Baseline:  Goal status: Ongoing 07/21/23  7. Pt to be able stand for the entire session >/= 30 minutes and not need to rely on leaning without feeling off balance or unstable during work related tasks as well as personal ADLs per her personal goal .  Baseline:  Goal status: ongoing  PLAN:  PT FREQUENCY: 1-2x/week  PT DURATION: 8 weeks  PLANNED INTERVENTIONS: 97164- PT Re-evaluation, 97110-Therapeutic exercises, 97530- Therapeutic activity, 97112- Neuromuscular re-education, 97535- Self Care, 16109- Manual therapy, 97116- Gait training, 97014- Electrical stimulation (unattended), Patient/Family education, Dry Needling, Cryotherapy, and Moist heat.  PLAN FOR NEXT SESSION: Review/ update HEP. Gross strengthening/ endurance training. Continued EMOMs progress with balance training per pt request, core activation. Gave goal performing swallow study at work without relying on leaning on the bed for 3:30 and after taking a short break to work on getting an additional 1:30 without leaning. Practicing a sustained squat.   Koa Palla PT, DPT, LAT, ATC  08/03/23  9:39 PM

## 2023-08-04 ENCOUNTER — Ambulatory Visit: Admitting: Physical Therapy

## 2023-08-11 ENCOUNTER — Ambulatory Visit: Admitting: Physical Therapy

## 2023-08-11 ENCOUNTER — Encounter: Payer: Self-pay | Admitting: Physical Therapy

## 2023-08-11 VITALS — BP 120/80 | HR 75

## 2023-08-11 DIAGNOSIS — M6281 Muscle weakness (generalized): Secondary | ICD-10-CM | POA: Diagnosis not present

## 2023-08-11 NOTE — Therapy (Signed)
 OUTPATIENT PHYSICAL THERAPY THORACOLUMBAR TREATMENT    Patient Name: Roberta [name] MRN: [id number] DOB:08/17/1990, 33 y.o., female Today's Date: 08/11/2023  END OF SESSION:  PT End of Session - 08/11/23 1018     Visit Number 17    Number of Visits 23    Date for PT Re-Evaluation 09/15/23    Authorization Type cigna [id number]    PT Start Time 1017    PT Stop Time 1100    PT Time Calculation (min) 43 min                        Past Medical History:  Diagnosis Date   Anxiety    Depression    Dumping syndrome    Ehlers-Danlos syndrome    Postural orthostatic tachycardia syndrome    Past Surgical History:  Procedure Laterality Date   BIOPSY  11/20/2020   Procedure: BIOPSY;  Surgeon: Lanita Pitman, MD;  Location: [facility];  Service: Endoscopy;;   ESOPHAGOGASTRODUODENOSCOPY N/A 11/20/2020   Procedure: ESOPHAGOGASTRODUODENOSCOPY (EGD);  Surgeon: Lanita Pitman, MD;  Location: [facility];  Service: Endoscopy;  Laterality: N/A;   INNER EAR SURGERY     Right ventriculoateral shunt revision Right 2024   UPPER GI ENDOSCOPY     Patient Active Problem List   Diagnosis Date Noted   Generalized anxiety disorder 07/02/2020   Dry heaves 03/28/2020   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, involving the cardia 03/28/2020   Nausea and vomiting 03/28/2020   Ehlers-Danlos disease 11/22/2019   Chronic eczematous otitis externa of both ears 12/07/2018   Excessive cerumen in both ear canals 12/07/2018   POTS (postural orthostatic tachycardia syndrome) 11/04/2014   Benign essential hypertension 10/10/2014   Lymphadenopathy 02/10/2011    PCP: Imelda Man, MD  REFERRING PROVIDER:  Jhon Moselle, FNP   REFERRING DIAG: Post op weakness  Rationale for Evaluation and Treatment: Rehabilitation  THERAPY DIAG:  Muscle weakness (generalized)  ONSET DATE: 02/22/2023  SUBJECTIVE:                                                                                                                                                                                            SUBJECTIVE STATEMENT: 08/11/2023 " I am feeling super dizzy today, the L kidney is sore today, pain is 3/10"   EVAL Patient arrives to PT following Right ventriculoateral shunt revision that occurred on 02/22/2023. She was readmitted after she was d/c due excessive N/V eventually D/C 3 days later. Since she was discharged she reports feeling overall fatigued and weak. She did report falling down a flight of steps (10 steps) on  12/15, but notes she didn't hurt her head oranything else and notes she was just bruised, but didn't mention this to her MD. 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   PERTINENT HISTORY:  See flow Dr. Denton Flakes visit this week 07/20/23: The case was discussed with the patient at length and she was reassured regarding her cardiac status. The patient was advised to continue her current medical regimen. However she was instructed to uptitrate her dosage of guanfacine to 1 mg twice daily gradually with close follow-up of her blood pressure. 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 The patient will be initiated on acarbose 25 mg before every meal for potential management of her GI symptomatology. She was instructed to contact our office through MyChart with an update regarding her clinical status in 4 weeks. Meanwhile, she was once again counseled regarding liberal fluid and salt intake, especially during the summertime. The patient was also counseled regarding daily aerobic exercises targeted to the lower body; small, frequent, low carbohydrate and high-protein meals; and the use of compression garments. I do  not recommend any further cardiac workup at this time and the patient will continue to follow-up with her primary care physician. The patient will return to our office for a follow-up appointment in 6 months.  PAIN:  Are you having pain? No  PRECAUTIONS: Fall  RED FLAGS: None   WEIGHT BEARING RESTRICTIONS: No  FALLS:  Has patient fallen in last 6 months? Yes. Number of falls 1  LIVING ENVIRONMENT: Lives with: lives alone Lives in: House/apartment Stairs: Elevator  Has following equipment at home: None  OCCUPATION: SLP  PLOF: Independent  PATIENT GOALS: Endurance, working without feeling fatigued/ tired, taking the dog for a walk without issues.  To be able to stand for 30 min without leaning or feeling fatigued following a swallow exam.    OBJECTIVE:  Note: Objective measures were completed at Evaluation unless otherwise noted.  DIAGNOSTIC FINDINGS:  See chart  PATIENT SURVEYS:  FOTO 64%, predicted 67% 05/26/23 49% (pt noted she didn't answer the intake questions honestly)  COGNITION: Overall cognitive status: Within functional limits for tasks assessed      POSTURE: rounded shoulders, forward head, and flexed trunk   PALPATION: TTP along the R >L scalenes/ SCM, upper trap/ levator scapulae.   CERVICAL ROM:   Active ROM A/PROM (deg) eval  Flexion 60  Extension 40  Right lateral flexion 56  Left lateral flexion 52  Right rotation 40  Left rotation 20   (Blank rows = not tested)  LOWER EXTREMITY ROM:     Active  Right eval Left eval  Hip flexion [facility]  Hip extension [facility]  Hip abduction [facility]  Hip adduction [facility]  Hip internal rotation [facility]  Hip external rotation [facility]  Knee flexion [facility]  Knee extension [facility]  Ankle dorsiflexion [facility]  Ankle plantarflexion    Ankle inversion    Ankle eversion     (Blank rows = not tested)  LOWER EXTREMITY MMT:    MMT Right eval Left eval Right 05/26/23 Left 05/26/23 Rt 07/21/23 Lt.   07/21/23  Hip flexion 4 4 4+ 4 4+ 4+  Hip extension 3+ 3+  4- 4-    Hip abduction 3+ 3+ 4- 4-    Hip adduction 4 4 4 4     Hip internal rotation 4- 4- 4+ 4+    Hip external rotation 4- 4- 4+ 4+    Knee flexion 4- 4 4 4  4+ 4+  Knee extension 4- 4 4 4  4+ 4+  Ankle dorsiflexion 4- 4- 4+ 4+ 5 5  Ankle plantarflexion 4 4      Ankle inversion 4 4      Ankle eversion 4 4       (Blank rows = not tested)  UPPER EXTREMITY MMT:  MMT Right eval Left eval Right 05/26/23 Left 05/26/23 Rt./Lt.  07/21/23  Shoulder flexion 4- 3+ 4 4- 4/5 bilateral  Shoulder extension 4 4 4+ 4+   Shoulder abduction 3+ 3+ 4- 4- 4/5 bilateral  Shoulder adduction       Shoulder extension       Shoulder internal rotation 4 4     Shoulder external rotation 4- 3+ 4- 3+   Middle trapezius 4- 4- 4 4   Lower trapezius 4- 4- 4 4   Elbow flexion 4- 4- 4- 4-   Elbow extension 4 4 4 4    Wrist flexion       Wrist extension       Wrist ulnar deviation       Wrist radial deviation       Wrist pronation       Wrist supination       Grip strength        (Blank rows = not tested)  UPPER EXTREMITY ROM:  Active ROM Right eval Left eval  Shoulder flexion [facility]  Shoulder extension [facility]  Shoulder abduction [facility] WFL  Shoulder adduction [facility] WFL  Shoulder extension [facility]  Shoulder internal rotation    Shoulder external rotation    Elbow flexion    Elbow extension    Wrist flexion    Wrist extension    Wrist ulnar deviation    Wrist radial deviation    Wrist pronation    Wrist supination     (Blank rows = not tested)   Note: Core strength 3/5 in supine with (with arms extended)  05/26/23: Core strength 3+/5  FUNCTIONAL TESTS:  30 seconds chair stand test 7 reps  GAIT: Distance walked: 100 ft to treatment area Assistive device utilized: None Level of assistance: Complete Independence Comments: Flexed trunk, varying stride lengths bil and postural sway noted  TREATMENT [facility] Adult PT Treatment:                                                 DATE: 08/03/23 Vitals:   08/11/23 1019 08/11/23 1055  BP: (!) 110/58 120/80  Pulse: 78 75  SpO2: 97% 98%   Seated balance on dynadisc with hands on knees Seated marching on dynadisc with mod postural sway noted Recumbent bike x 6 min goal of maintaining consistent RPM at 51 Sit to stand with touches to dyna disc 2 x 10 with seated rest break inbtween sets Sit to stand with over head reach with 2# on each wrist 2 x 15 Standing alternating L/R punches with varying target for pertubation with 2# on wrist  [facility] Adult PT Treatment:  DATE: 07/28/23 Vitals:   07/28/23 1021 07/28/23 1053  BP: 110/70 118/88  Pulse: (!) 57 70  SpO2: 99% 98%    Stepper l1 x 2 min (cues for smooth slow controlled consisency) Standing lunges 2 x 10 bil in // Rhomberg balance 3 x 30 sec in // Sit to stand with 3 sec hoover before sitting. 2 x 5  [facility] Adult PT Treatment:                                                DATE: 07/21/23 BP 142/101, 145/104 (took double dose of meds already)  RHR 82  90/90 hold 90/90 reverse toe taps Hip abd, MMT Bridge x 10  Single leg x 5  Squat 10 sec x 10  Squat 5 sec hold 15 lbs  BP after squats 156/96 , HR 97  PATIENT EDUCATION: 06/02/23 Education details: importance of focusing on breathing during exercise and effects not breathing efficiently and how it can impact function.  Person educated: Patient Education method: Explanation, Demonstration, Verbal cues, and Handouts Education comprehension: verbalized understanding  HOME EXERCISE PROGRAM: Access Code: [email or web address] URL: [email or web address] Date: 05/26/2023 Prepared by: Laron Plummer  Exercises - supine bridge w/ brace  - 1 x daily - 7 x weekly - 2 sets - 10 reps - SLR  - 1 x daily - 7 x weekly - 2 sets - 10 reps - 1 hold - Sidelying Hip Abduction  - 1 x daily - 7 x weekly - 2 sets - 10 reps - Seated Transversus  Abdominis Bracing  - 1 x daily - 7 x weekly - 2 sets - 10 reps - 5 second hold - Scapular retraction with ER (MONEY)  - 1 x daily - 7 x weekly - 2 sets - 10 reps - Seated Gentle Upper Trapezius Stretch  - 1 x daily - 7 x weekly - 1 sets - 3 reps - 30 hold - Gentle Levator Scapulae Stretch  - 1 x daily - 7 x weekly - 3 sets - 3 reps - 30 hold - Standing Shoulder Row with Anchored Resistance  - 1 x daily - 7 x weekly - 2 sets - 10 reps - Seated Abdominal Press into [employer]  - 1 x daily - 7 x weekly - 2 sets - 10 reps - 3-5 seconds hold - Elbow Extension with Resistance  - 1 x daily - 7 x weekly - 2 sets - 12 reps  ASSESSMENT:  CLINICAL IMPRESSION: 5/22/2025Megan arrives to PT noting 3/10 pain in the L flank secondary to her recent bout with a kidney stone. She continues to report lower BP and as a result has noted feeling more dizzy compared to prior sessions. Modified session to seated balance and standing endurance which she demonstrated moderate postural sway requiring intermittent cues for proper form.   EVAL- Patient is a 33 y.o. F who was seen today for physical therapy evaluation and treatment for General weakness following her Right ventriculoateral shunt revision on 02/22/2023. She demonstrate function ROM with the exception of limited cervical L rotation. She demonstrates gross weakness in both bil UE/LE, seated /standing she demonstrates postural sway.  She reports have 1 fall down a flight of steps noting she only had bruises and didn't hit her head but also didn't notify her MD or neurosurgeon. Vitals were assessed before and  after session with BP starting at 141/97 and exhibited improvement at end of session following exercise in both supine and seated  position. Roberta [name] reports challenges with endurance and fatigue and noted it has been more challenging this week due to return to work full status and had worked 3 consecutive 10 hours shifts. She would benefit from physical therapy to  decrease neck stiffness/ tenderness, improve gross UE/LE strength, maximize stability and balance.   OBJECTIVE IMPAIRMENTS: decreased activity tolerance, decreased endurance, decreased ROM, decreased strength, improper body mechanics, and postural dysfunction.   ACTIVITY LIMITATIONS: carrying, lifting, bending, standing, squatting, reach over head, and locomotion level  PARTICIPATION LIMITATIONS: shopping, community activity, and occupation  PERSONAL FACTORS: Behavior pattern, Past/current experiences, Time since onset of injury/illness/exacerbation, and 1-2 comorbidities: POTS, EDS are also affecting patient's functional outcome.   REHAB POTENTIAL: Good  CLINICAL DECISION MAKING: Evolving/moderate complexity  EVALUATION COMPLEXITY: Moderate   GOALS: Goals reviewed with patient? Yes  SHORT TERM GOALS: Target date: 04/28/2023  Pt to be IND with initial HEP for therapeutic progression Baseline: Goal status: MET 05/19/23  LONG TERM GOALS: Target date: 09/15/2023    I'm gross UE strength to >/= 4/5 to promote stablity lifting/ carrying activities / tasks Baseline:  Goal status: Ongoing 07/21/23  2.  Improve gross LE strnegth to >/= 4/5 to assist with lifting mechanics and maintaining posture and stability  Baseline:  Goal status: Ongoing 07/21/23  3.  Improve FOTO score to >/= 68% to demo improvement in function Baseline:  Goal status: Ongoing 07/21/23  4.  Pt to be able to work a 10 hour day performing regular work tasks reporting </= min fatigue to demonstrate improvement in endurance. Baseline:  Status reports: moderate Goal status: Ongoing 07/21/23  5.  Improve core stength to >/= 4/5 to promote trunk stability/ reduce postural sway and maximize safety with work and ADLs Baseline:  Goal status: Ongoing 07/21/23  6.  Pt to be IND with all HEP and will be able to maintain and progress her current LOF IND Baseline:  Goal status: Ongoing 07/21/23  7. Pt to be able stand for the  entire session >/= 30 minutes and not need to rely on leaning without feeling off balance or unstable during work related tasks as well as personal ADLs per her personal goal .  Baseline:  Goal status: ongoing  PLAN:  PT FREQUENCY: 1-2x/week  PT DURATION: 8 weeks  PLANNED INTERVENTIONS: 97164- PT Re-evaluation, 97110-Therapeutic exercises, 97530- Therapeutic activity, 97112- Neuromuscular re-education, 97535- Self Care, 82956- Manual therapy, 97116- Gait training, 97014- Electrical stimulation (unattended), Patient/Family education, Dry Needling, Cryotherapy, and Moist heat.  PLAN FOR NEXT SESSION: Review/ update HEP. Gross strengthening/ endurance training. Continued EMOMs progress with balance training per pt request, core activation. Gave goal performing swallow study at work without relying on leaning on the bed for 3:30 and after taking a short break to work on getting an additional 1:30 without leaning. Practicing a sustained squat.   Kyasia Steuck PT, DPT, LAT, ATC  08/11/23  11:03 AM

## 2023-08-18 ENCOUNTER — Ambulatory Visit: Admitting: Physical Therapy

## 2023-08-18 ENCOUNTER — Encounter: Payer: Self-pay | Admitting: Physical Therapy

## 2023-08-18 VITALS — BP 121/91 | HR 64

## 2023-08-18 DIAGNOSIS — M6281 Muscle weakness (generalized): Secondary | ICD-10-CM

## 2023-08-18 NOTE — Therapy (Signed)
 OUTPATIENT PHYSICAL THERAPY THORACOLUMBAR TREATMENT    Patient Name: Roberta [name] MRN: [id number] DOB:06/27/1990, 33 y.o., female Today's Date: 08/18/2023  END OF SESSION:  PT End of Session - 08/18/23 1022     Visit Number 18    Number of Visits 23    Date for PT Re-Evaluation 09/15/23    Authorization Type cigna [id number]    PT Start Time 1017    PT Stop Time 1106    PT Time Calculation (min) 49 min    Activity Tolerance Patient tolerated treatment well;Patient limited by fatigue                         Past Medical History:  Diagnosis Date   Anxiety    Depression    Dumping syndrome    Ehlers-Danlos syndrome    Postural orthostatic tachycardia syndrome    Past Surgical History:  Procedure Laterality Date   BIOPSY  11/20/2020   Procedure: BIOPSY;  Surgeon: Lanita Pitman, MD;  Location: [facility];  Service: Endoscopy;;   ESOPHAGOGASTRODUODENOSCOPY N/A 11/20/2020   Procedure: ESOPHAGOGASTRODUODENOSCOPY (EGD);  Surgeon: Lanita Pitman, MD;  Location: [facility];  Service: Endoscopy;  Laterality: N/A;   INNER EAR SURGERY     Right ventriculoateral shunt revision Right 2024   UPPER GI ENDOSCOPY     Patient Active Problem List   Diagnosis Date Noted   Generalized anxiety disorder 07/02/2020   Dry heaves 03/28/2020   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, involving the cardia 03/28/2020   Nausea and vomiting 03/28/2020   Ehlers-Danlos disease 11/22/2019   Chronic eczematous otitis externa of both ears 12/07/2018   Excessive cerumen in both ear canals 12/07/2018   POTS (postural orthostatic tachycardia syndrome) 11/04/2014   Benign essential hypertension 10/10/2014   Lymphadenopathy 02/10/2011    PCP: Imelda Man, MD  REFERRING PROVIDER:  Jhon Moselle, FNP   REFERRING DIAG: Post op weakness  Rationale for Evaluation and Treatment: Rehabilitation  THERAPY DIAG:  Muscle weakness (generalized)  ONSET DATE:  02/22/2023  SUBJECTIVE:                                                                                                                                                                                           SUBJECTIVE STATEMENT: 08/18/2023 "I am doing okay, dizziness is gone. I no longer have a kidney infection and I no longer taking the antibiotics. I see the [facility] team next week to go over the SCD. I've returned to doing my activities every day and its a little harder."   EVAL Patient arrives to PT following Right ventriculoateral shunt  revision that occurred on 02/22/2023. She was readmitted after she was d/c due excessive N/V eventually D/C 3 days later. Since she was discharged she reports feeling overall fatigued and weak. She did report falling down a flight of steps (10 steps) on 12/15, but notes she didn't hurt her head oranything else and notes she was just bruised, but didn't mention this to her MD. 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   PERTINENT HISTORY:  See flow Dr. Denton Flakes visit this week 07/20/23: The case was discussed with the patient at length and she was reassured regarding her cardiac status. The patient was advised to continue her current medical regimen. However she was instructed to uptitrate her dosage of guanfacine to 1 mg twice daily gradually with close follow-up of her blood pressure. 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 The patient will be initiated on acarbose 25 mg before every meal for potential management of her GI symptomatology. She was instructed to contact our office through MyChart with an update regarding her clinical status in 4 weeks. Meanwhile, she was once again counseled  regarding liberal fluid and salt intake, especially during the summertime. The patient was also counseled regarding daily aerobic exercises targeted to the lower body; small, frequent, low carbohydrate and high-protein meals; and the use of compression garments. I do not recommend any further cardiac workup at this time and the patient will continue to follow-up with her primary care physician. The patient will return to our office for a follow-up appointment in 6 months.  PAIN:  Are you having pain? No  PRECAUTIONS: Fall  RED FLAGS: None   WEIGHT BEARING RESTRICTIONS: No  FALLS:  Has patient fallen in last 6 months? Yes. Number of falls 1  LIVING ENVIRONMENT: Lives with: lives alone Lives in: House/apartment Stairs: Elevator  Has following equipment at home: None  OCCUPATION: SLP  PLOF: Independent  PATIENT GOALS: Endurance, working without feeling fatigued/ tired, taking the dog for a walk without issues.  To be able to stand for 30 min without leaning or feeling fatigued following a swallow exam.    OBJECTIVE:  Note: Objective measures were completed at Evaluation unless otherwise noted.  DIAGNOSTIC FINDINGS:  See chart  PATIENT SURVEYS:  FOTO 64%, predicted 67% 05/26/23 49% (pt noted she didn't answer the intake questions honestly) 08/18/23 49%  COGNITION: Overall cognitive status: Within functional limits for tasks assessed      POSTURE: rounded shoulders, forward head, and flexed trunk   PALPATION: TTP along the R >L scalenes/ SCM, upper trap/ levator scapulae.   CERVICAL ROM:   Active ROM A/PROM (deg) eval  Flexion 60  Extension 40  Right lateral flexion 56  Left lateral flexion 52  Right rotation 40  Left rotation 20   (Blank rows = not tested)  LOWER EXTREMITY ROM:     Active  Right eval Left eval  Hip flexion [facility]  Hip extension [facility]  Hip abduction [facility]  Hip adduction [facility]  Hip internal rotation [name]  Hip external rotation  [facility]  Knee flexion [facility]  Knee extension [facility]  Ankle dorsiflexion [facility]  Ankle plantarflexion    Ankle inversion    Ankle  eversion     (Blank rows = not tested)  LOWER EXTREMITY MMT:    MMT Right eval Left eval Right 05/26/23 Left 05/26/23 Rt 07/21/23 Lt.  07/21/23 RT 08/18/23 LT 08/18/23  Hip flexion 4 4 4+ 4 4+ 4+ 4+ 4+  Hip extension 3+ 3+ 4- 4-   4- 4-  Hip abduction 3+ 3+ 4- 4-   4- 4-  Hip adduction 4 4 4 4   4 4   Hip internal rotation 4- 4- 4+ 4+      Hip external rotation 4- 4- 4+ 4+      Knee flexion 4- 4 4 4  4+ 4+ 4+ 4+  Knee extension 4- 4 4 4  4+ 4+ 5 5  Ankle dorsiflexion 4- 4- 4+ 4+ 5 5    Ankle plantarflexion 4 4        Ankle inversion 4 4        Ankle eversion 4 4         (Blank rows = not tested)  UPPER EXTREMITY MMT:  MMT Right eval Left eval Right 05/26/23 Left 05/26/23 Rt./Lt.  07/21/23 RT 08/18/23 LT 08/18/23  Shoulder flexion 4- 3+ 4 4- 4/5 bilateral 4 4  Shoulder extension 4 4 4+ 4+  4- 4  Shoulder abduction 3+ 3+ 4- 4- 4/5 bilateral 4- 4-  Shoulder adduction         Shoulder extension         Shoulder internal rotation 4 4    4 4   Shoulder external rotation 4- 3+ 4- 3+  3+ 3+  Middle trapezius 4- 4- 4 4     Lower trapezius 4- 4- 4 4     Elbow flexion 4- 4- 4- 4-  4 4  Elbow extension 4 4 4 4   4- 4-  Wrist flexion         Wrist extension         Wrist ulnar deviation         Wrist radial deviation         Wrist pronation         Wrist supination         Grip strength          (Blank rows = not tested)  UPPER EXTREMITY ROM:  Active ROM Right eval Left eval  Shoulder flexion [facility]  Shoulder extension [facility]  Shoulder abduction [facility]  Shoulder adduction [facility]  Shoulder extension [facility]  Shoulder internal rotation    Shoulder external rotation    Elbow flexion    Elbow extension    Wrist flexion    Wrist extension    Wrist ulnar deviation    Wrist radial deviation    Wrist pronation    Wrist supination     (Blank  rows = not tested)   Note: Core strength 3/5 in supine with (with arms extended)  05/26/23: Core strength 3+/5 08/18/23: Core strength 3+/5  FUNCTIONAL TESTS:  30 seconds chair stand test 7 reps  GAIT: Distance walked: 100 ft to treatment area Assistive device utilized: None Level of assistance: Complete Independence Comments: Flexed trunk, varying stride lengths bil and postural sway noted  TREATMENT [facility] Adult PT Treatment:  DATE: 08/18/23 Vitals:   08/18/23 1022 08/18/23 1108  BP: 118/78 (!) 121/91  Pulse: (!) 58 64  SpO2: 99% 97%   Recumbent bike L 5 x 5 min cues to keep RPM at 50 Standing corner balance with chair in front 4 x 30 sec Marching in corner 2 x 20 Standing hip abduction/ extension 2 x 18 ea Standing bil heel raises 1 x 20 1 more round of rhomberg balance 2 x 30  Updated HEP for corner balance.   [facility] Adult PT Treatment:                                                DATE: 08/03/23 Vitals:   08/11/23 1019 08/11/23 1055  BP: (!) 110/58 120/80  Pulse: 78 75  SpO2: 97% 98%   Seated balance on dynadisc with hands on knees Seated marching on dynadisc with mod postural sway noted Recumbent bike x 6 min goal of maintaining consistent RPM at 51 Sit to stand with touches to dyna disc 2 x 10 with seated rest break inbtween sets Sit to stand with over head reach with 2# on each wrist 2 x 15 Standing alternating L/R punches with varying target for pertubation with 2# on wrist   [facility] Adult PT Treatment:                                                DATE: 07/28/23 Vitals:   07/28/23 1021 07/28/23 1053  BP: 110/70 118/88  Pulse: (!) 57 70  SpO2: 99% 98%    Stepper l1 x 2 min (cues for smooth slow controlled consisency) Standing lunges 2 x 10 bil in // Rhomberg balance 3 x 30 sec in // Sit to stand with 3 sec hoover before sitting. 2 x 5  PATIENT EDUCATION: 06/02/23 Education details: importance of focusing on  breathing during exercise and effects not breathing efficiently and how it can impact function.  Person educated: Patient Education method: Explanation, Demonstration, Verbal cues, and Handouts Education comprehension: verbalized understanding  HOME EXERCISE PROGRAM: Access Code: [email or web address] URL: [email or web address] Date: 08/18/2023 Prepared by: Laron Plummer  Exercises - supine bridge w/ brace  - 1 x daily - 7 x weekly - 2 sets - 10 reps - SLR  - 1 x daily - 7 x weekly - 2 sets - 10 reps - 1 hold - Sidelying Hip Abduction  - 1 x daily - 7 x weekly - 2 sets - 10 reps - Seated Transversus Abdominis Bracing  - 1 x daily - 7 x weekly - 2 sets - 10 reps - 5 second hold - Scapular retraction with ER (MONEY)  - 1 x daily - 7 x weekly - 2 sets - 10 reps - Seated Gentle Upper Trapezius Stretch  - 1 x daily - 7 x weekly - 1 sets - 3 reps - 30 hold - Gentle Levator Scapulae Stretch  - 1 x daily - 7 x weekly - 3 sets - 3 reps - 30 hold - Standing Shoulder Row with Anchored Resistance  - 1 x daily - 7 x weekly - 2 sets - 10 reps - Seated Abdominal Press into [employer]  - 1 x daily - 7 x weekly -  2 sets - 10 reps - 3-5 seconds hold - Elbow Extension with Resistance  - 1 x daily - 7 x weekly - 2 sets - 12 reps - Standing Balance in Corner  - 1 x daily - 7 x weekly - 2 sets - 10 reps - 30 sec hold - Corner Balance Feet Together With Eyes Open  - 1 x daily - 7 x weekly - 2 sets - 10 reps - 30 sec hold - Corner Balance Feet Together With Eyes Open  - 1 x daily - 7 x weekly - 2 sets - 10 reps  ASSESSMENT:  CLINICAL IMPRESSION: 5/29/2025Megan arrives to PT today noting improvement in her dizziness since last session and reports her BP has improved since she stopped taking the medication for her kidney infection which had resolved too. Based on further assessment today Marlayna has maintained her current level of function/ strength bu thas made limited progress over the last 3 months. Focused  session balance and home strengthening activities to continue promoting the progress she has made with PT. Based on her current LOF and ability to maintain and progress her exercises independently, plan to see her for one more visit to update HEP address any questions and discharged.   EVAL- Patient is a 33 y.o. F who was seen today for physical therapy evaluation and treatment for General weakness following her Right ventriculoateral shunt revision on 02/22/2023. She demonstrate function ROM with the exception of limited cervical L rotation. She demonstrates gross weakness in both bil UE/LE, seated /standing she demonstrates postural sway.  She reports have 1 fall down a flight of steps noting she only had bruises and didn't hit her head but also didn't notify her MD or neurosurgeon. Vitals were assessed before and after session with BP starting at 141/97 and exhibited improvement at end of session following exercise in both supine and seated  position. Darlin reports challenges with endurance and fatigue and noted it has been more challenging this week due to return to work full status and had worked 3 consecutive 10 hours shifts. She would benefit from physical therapy to decrease neck stiffness/ tenderness, improve gross UE/LE strength, maximize stability and balance.   OBJECTIVE IMPAIRMENTS: decreased activity tolerance, decreased endurance, decreased ROM, decreased strength, improper body mechanics, and postural dysfunction.   ACTIVITY LIMITATIONS: carrying, lifting, bending, standing, squatting, reach over head, and locomotion level  PARTICIPATION LIMITATIONS: shopping, community activity, and occupation  PERSONAL FACTORS: Behavior pattern, Past/current experiences, Time since onset of injury/illness/exacerbation, and 1-2 comorbidities: POTS, EDS are also affecting patient's functional outcome.   REHAB POTENTIAL: Good  CLINICAL DECISION MAKING: Evolving/moderate complexity  EVALUATION  COMPLEXITY: Moderate   GOALS: Goals reviewed with patient? Yes  SHORT TERM GOALS: Target date: 04/28/2023  Pt to be IND with initial HEP for therapeutic progression Baseline: Goal status: MET 05/19/23  LONG TERM GOALS: Target date: 09/15/2023    I'm gross UE strength to >/= 4/5 to promote stablity lifting/ carrying activities / tasks Baseline:  Goal status: Ongoing 08/18/23  2.  Improve gross LE strnegth to >/= 4/5 to assist with lifting mechanics and maintaining posture and stability  Baseline:  Goal status: Ongoing 08/18/23  3.  Improve FOTO score to >/= 68% to demo improvement in function Baseline:  Goal status: Ongoing 08/18/23  4.  Pt to be able to work a 10 hour day performing regular work tasks reporting </= min fatigue to demonstrate improvement in endurance. Baseline:  Status reports: moderate Status report: 1/2  time minimum (NICU) / 1/2 time moderate (adult) Goal status: partially  08/18/23  5.  Improve core stength to >/= 4/5 to promote trunk stability/ reduce postural sway and maximize safety with work and ADLs Baseline:  Status:  Goal status: Ongoing 08/18/23  6.  Pt to be IND with all HEP and will be able to maintain and progress her current LOF IND Baseline:  Goal status: Ongoing 08/18/23  7. Pt to be able stand for the entire session >/= 30 minutes and not need to rely on leaning without feeling off balance or unstable during work related tasks as well as personal ADLs per her personal goal .  Baseline:  Status: reports 15 minutes but notes she is able to do it for 8 minutes.  Goal status: ongoing 08/18/23 PLAN:  PT FREQUENCY: 1-2x/week  PT DURATION: 8 weeks  PLANNED INTERVENTIONS: 97164- PT Re-evaluation, 97110-Therapeutic exercises, 97530- Therapeutic activity, 97112- Neuromuscular re-education, 97535- Self Care, [id number]- Manual therapy, [id number]- Gait training, 97014- Electrical stimulation (unattended), Patient/Family education, Dry Needling, Cryotherapy, and  Moist heat.  PLAN FOR NEXT SESSION: reivew/ update HEP. Did pt get pictures of her home gym/ equipment? Discharge.   Everette Dimauro PT, DPT, LAT, ATC  08/18/23  12:33 PM

## 2023-08-25 ENCOUNTER — Ambulatory Visit: Attending: Registered Nurse | Admitting: Physical Therapy

## 2023-08-25 ENCOUNTER — Encounter: Payer: Self-pay | Admitting: Physical Therapy

## 2023-08-25 VITALS — BP 126/92 | HR 75

## 2023-08-25 DIAGNOSIS — M6281 Muscle weakness (generalized): Secondary | ICD-10-CM | POA: Insufficient documentation

## 2023-08-25 DIAGNOSIS — R55 Syncope and collapse: Secondary | ICD-10-CM | POA: Insufficient documentation

## 2023-08-25 NOTE — Therapy (Signed)
 OUTPATIENT PHYSICAL THERAPY THORACOLUMBAR TREATMENT  PHYSICAL THERAPY DISCHARGE SUMMARY  Visits from Start of Care: 19  Current functional level related to goals / functional outcomes: FOTO 49% , see goals.    Remaining deficits: See assessment   Education / Equipment: Goals, HEP , posture.    Patient agrees to discharge. Patient goals were not met. Patient is being discharged due to maximized rehab potential.   Laron Plummer PT, DPT, LAT, ATC  08/25/23  5:15 PM        Patient Name: Roberta [name] MRN: [id number] DOB:13-Apr-1990, 33 y.o., female Today's Date: 08/25/2023  END OF SESSION:  PT End of Session - 08/25/23 1334     Visit Number 19    Number of Visits 23    Date for PT Re-Evaluation 09/15/23    Authorization Type cigna [id number]    PT Start Time 1331    PT Stop Time 1415    PT Time Calculation (min) 44 min    Activity Tolerance Patient tolerated treatment well    Behavior During Therapy WFL for tasks assessed/performed                          Past Medical History:  Diagnosis Date   Anxiety    Depression    Dumping syndrome    Ehlers-Danlos syndrome    Postural orthostatic tachycardia syndrome    Past Surgical History:  Procedure Laterality Date   BIOPSY  11/20/2020   Procedure: BIOPSY;  Surgeon: Lanita Pitman, MD;  Location: [facility];  Service: Endoscopy;;   ESOPHAGOGASTRODUODENOSCOPY N/A 11/20/2020   Procedure: ESOPHAGOGASTRODUODENOSCOPY (EGD);  Surgeon: Lanita Pitman, MD;  Location: [facility];  Service: Endoscopy;  Laterality: N/A;   INNER EAR SURGERY     Right ventriculoateral shunt revision Right 2024   UPPER GI ENDOSCOPY     Patient Active Problem List   Diagnosis Date Noted   Generalized anxiety disorder 07/02/2020   Dry heaves 03/28/2020   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, involving the cardia 03/28/2020   Nausea and vomiting 03/28/2020   Ehlers-Danlos disease 11/22/2019   Chronic eczematous  otitis externa of both ears 12/07/2018   Excessive cerumen in both ear canals 12/07/2018   POTS (postural orthostatic tachycardia syndrome) 11/04/2014   Benign essential hypertension 10/10/2014   Lymphadenopathy 02/10/2011    PCP: Imelda Man, MD  REFERRING PROVIDER:  Jhon Moselle, FNP   REFERRING DIAG: Post op weakness  Rationale for Evaluation and Treatment: Rehabilitation  THERAPY DIAG:  Muscle weakness (generalized)  ONSET DATE: 02/22/2023  SUBJECTIVE:  SUBJECTIVE STATEMENT: 08/25/2023 "Saw the [facility] MDs at the [facility], and it didn't go well and I need to go have surgery. I got a second opinion [facility] and they agreed with [facility]'s assessment."   EVAL Patient arrives to PT following Right ventriculoateral shunt revision that occurred on 02/22/2023. She was readmitted after she was d/c due excessive N/V eventually D/C 3 days later. Since she was discharged she reports feeling overall fatigued and weak. She did report falling down a flight of steps (10 steps) on 12/15, but notes she didn't hurt her head oranything else and notes she was just bruised, but didn't mention this to her MD. 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   PERTINENT HISTORY:  See flow Dr. Denton Flakes visit this week 07/20/23: The case was discussed with the patient at length and she was reassured regarding her cardiac status. The patient was advised to continue her current medical regimen. However she was instructed to uptitrate her dosage of guanfacine to 1 mg twice daily gradually with close follow-up of her blood pressure. 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 The patient will be initiated on acarbose 25 mg before every meal for potential management of her GI symptomatology. She was instructed to contact our office through MyChart with an update regarding her clinical status in 4 weeks. Meanwhile, she was once again counseled regarding liberal fluid and salt intake, especially during the summertime. The patient was also counseled regarding daily aerobic exercises targeted to the lower body; small, frequent, low carbohydrate and high-protein meals; and the use of compression garments. I do not recommend any further cardiac workup at this time and the patient will continue to follow-up with her primary care physician. The patient will return to our office for a follow-up appointment in 6 months.  PAIN:  Are you having pain? No  PRECAUTIONS: Fall  RED FLAGS: None   WEIGHT BEARING RESTRICTIONS: No  FALLS:  Has patient fallen in last 6 months? Yes. Number of falls 1  LIVING ENVIRONMENT: Lives with: lives alone Lives in: House/apartment Stairs: Elevator  Has following equipment at home: None  OCCUPATION: SLP  PLOF: Independent  PATIENT GOALS: Endurance, working without feeling fatigued/ tired, taking the dog for a walk without issues.  To be able to stand for 30 min without leaning or feeling fatigued following a swallow exam.    OBJECTIVE:  Note: Objective measures were completed at Evaluation unless otherwise noted.  DIAGNOSTIC FINDINGS:  See chart  PATIENT SURVEYS:  FOTO 64%, predicted 67% 05/26/23 49% (pt noted she didn't answer the intake questions honestly) 08/18/23 49%  COGNITION: Overall cognitive status: Within functional limits for tasks assessed      POSTURE: rounded shoulders, forward head, and flexed trunk   PALPATION: TTP along the R >L scalenes/ SCM, upper trap/ levator scapulae.   CERVICAL ROM:   Active  ROM A/PROM (deg) eval  Flexion 60  Extension 40  Right lateral flexion 56  Left lateral flexion 52  Right rotation 40  Left rotation 20   (Blank rows = not tested)  LOWER EXTREMITY ROM:     Active  Right eval Left eval  Hip flexion [facility]  Hip extension [facility]  Hip abduction  [facility] WFL  Hip adduction [facility]  Hip internal rotation [facility]  Hip external rotation [facility]  Knee flexion [facility]  Knee extension [facility]  Ankle dorsiflexion [facility]  Ankle plantarflexion    Ankle inversion    Ankle eversion     (Blank rows = not tested)  LOWER EXTREMITY MMT:    MMT Right eval Left eval Right 05/26/23 Left 05/26/23 Rt 07/21/23 Lt.  07/21/23 RT 08/18/23 LT 08/18/23  Hip flexion 4 4 4+ 4 4+ 4+ 4+ 4+  Hip extension 3+ 3+ 4- 4-   4- 4-  Hip abduction 3+ 3+ 4- 4-   4- 4-  Hip adduction 4 4 4 4   4 4   Hip internal rotation 4- 4- 4+ 4+      Hip external rotation 4- 4- 4+ 4+      Knee flexion 4- 4 4 4  4+ 4+ 4+ 4+  Knee extension 4- 4 4 4  4+ 4+ 5 5  Ankle dorsiflexion 4- 4- 4+ 4+ 5 5    Ankle plantarflexion 4 4        Ankle inversion 4 4        Ankle eversion 4 4         (Blank rows = not tested)  UPPER EXTREMITY MMT:  MMT Right eval Left eval Right 05/26/23 Left 05/26/23 Rt./Lt.  07/21/23 RT 08/18/23 LT 08/18/23  Shoulder flexion 4- 3+ 4 4- 4/5 bilateral 4 4  Shoulder extension 4 4 4+ 4+  4- 4  Shoulder abduction 3+ 3+ 4- 4- 4/5 bilateral 4- 4-  Shoulder adduction         Shoulder extension         Shoulder internal rotation 4 4    4 4   Shoulder external rotation 4- 3+ 4- 3+  3+ 3+  Middle trapezius 4- 4- 4 4     Lower trapezius 4- 4- 4 4     Elbow flexion 4- 4- 4- 4-  4 4  Elbow extension 4 4 4 4   4- 4-  Wrist flexion         Wrist extension         Wrist ulnar deviation         Wrist radial deviation         Wrist pronation         Wrist supination         Grip strength          (Blank rows = not tested)  UPPER EXTREMITY ROM:  Active ROM Right eval Left eval   Shoulder flexion [facility]  Shoulder extension [facility]  Shoulder abduction [facility]  Shoulder adduction [facility] WFL  Shoulder extension [facility]  Shoulder internal rotation    Shoulder external rotation    Elbow flexion    Elbow extension    Wrist flexion    Wrist extension    Wrist ulnar deviation    Wrist radial deviation    Wrist pronation    Wrist supination     (Blank rows = not tested)   Note: Core strength 3/5 in supine with (with arms extended)  05/26/23: Core strength 3+/5 08/18/23: Core strength 3+/5  FUNCTIONAL TESTS:  30 seconds chair stand test 7 reps  GAIT: Distance walked: 100 ft to treatment area Assistive device utilized: None Level of assistance: Complete Independence Comments: Flexed trunk, varying stride lengths bil and postural sway noted  TREATMENT [facility] Adult PT  Treatment:                                                DATE: 08/25/23 Vitals:   08/25/23 1341 08/25/23 1405  BP: (!) 126/96 (!) 126/92  Pulse: 69 75  SpO2: 95% 97%   Extensively reviewed HEP and discussed progression and consistency Reviewed progress she has made since starting PT  [facility] Adult PT Treatment:                                                DATE: 08/18/23 Vitals:    08/18/23 1022 08/18/23 1108  BP: 118/78 (!) 121/91  Pulse: (!) 58 64  SpO2: 99% 97%    Recumbent bike L 5 x 5 min cues to keep RPM at 50 Standing corner balance with chair in front 4 x 30 sec Marching in corner 2 x 20 Standing hip abduction/ extension 2 x 18 ea Standing bil heel raises 1 x 20 1 more round of rhomberg balance 2 x 30  Updated HEP for corner balance.   [facility] Adult PT Treatment:                                                DATE: 08/03/23 Vitals:   08/11/23 1019 08/11/23 1055  BP: (!) 110/58 120/80  Pulse: 78 75  SpO2: 97% 98%   Seated balance on dynadisc with hands on knees Seated marching on dynadisc with mod postural sway noted Recumbent bike x 6 min goal of maintaining consistent RPM at  51 Sit to stand with touches to dyna disc 2 x 10 with seated rest break inbtween sets Sit to stand with over head reach with 2# on each wrist 2 x 15 Standing alternating L/R punches with varying target for pertubation with 2# on wrist  PATIENT EDUCATION: 06/02/23 Education details: importance of focusing on breathing during exercise and effects not breathing efficiently and how it can impact function.  Person educated: Patient Education method: Explanation, Demonstration, Verbal cues, and Handouts Education comprehension: verbalized understanding  HOME EXERCISE PROGRAM: Access Code: [email or web address] URL: [email or web address] Date: 08/25/2023 Prepared by: Laron Plummer  Program Notes Just like we did in the clinic. you can gradually add more reps to promote not only strength but endurance. Make sure you are breathing throughout ALL exericses. Do not hold your breath.   Exercises - supine bridge w/ brace  - 1 x daily - 7 x weekly - 2 sets - 10 reps - Sit to Stand with Arms Crossed  - 1 x daily - 7 x weekly - 2 sets - 10 reps - SLR  - 1 x daily - 7 x weekly - 2 sets - 10 reps - 1 hold - Sidelying Hip Abduction  - 1 x daily - 7 x weekly - 2 sets - 10 reps - Standing Hip Abduction with Resistance at Ankles and Counter Support  - 1 x daily - 7 x weekly - 2 sets - 10 reps - Standing Heel Raises  - 1 x daily - 7 x weekly - 2 sets - 10 reps -  Seated Transversus Abdominis Bracing  - 1 x daily - 7 x weekly - 2 sets - 10 reps - 5 second hold - Scapular retraction with ER (MONEY)  - 1 x daily - 7 x weekly - 2 sets - 10 reps - Seated Gentle Upper Trapezius Stretch  - 1 x daily - 7 x weekly - 1 sets - 3 reps - 30 hold - Gentle Levator Scapulae Stretch  - 1 x daily - 7 x weekly - 3 sets - 3 reps - 30 hold - Seated Shoulder Horizontal Abduction with Resistance  - 1 x daily - 7 x weekly - 2 sets - 10 reps - Standing Shoulder Row with Anchored Resistance  - 1 x daily - 7 x weekly - 2 sets - 10  reps - Seated Abdominal Press into [employer]  - 1 x daily - 7 x weekly - 2 sets - 10 reps - 3-5 seconds hold - Elbow Extension with Resistance  - 1 x daily - 7 x weekly - 2 sets - 12 reps - Standing Balance in Corner  - 1 x daily - 7 x weekly - 2 sets - 10 reps - 30 sec hold - Corner Balance Feet Together With Eyes Open  - 1 x daily - 7 x weekly - 2 sets - 10 reps - 30 sec hold - Corner Balance Feet Together With Eyes Open  - 1 x daily - 7 x weekly - 2 sets - 10 reps - Supine Heel Slide  - 1 x daily - 7 x weekly - 2 sets - 10 reps - Supine Gluteal Sets  - 1 x daily - 7 x weekly - 2 sets - 10 reps - Supine Quadricep Sets  - 1 x daily - 7 x weekly - 2 sets - 10 reps - Supine Ankle Pumps  - 1 x daily - 7 x weekly - 2 sets - 10 reps - Supine Ankle Circles  - 1 x daily - 7 x weekly - 2 sets - 10 reps - Supine Isometric Hip Adduction with Pillow at Knees  - 1 x daily - 7 x weekly - 2 sets - 10 reps - Supine Transversus Abdominis Bracing - Hands on Ground  - 1 x daily - 7 x weekly - 2 sets - 10 reps  ASSESSMENT:  CLINICAL IMPRESSION: 08/25/2023 Roberta [name] has made good progress with PT. She continues to have intermittent postural sway and dizziness which is likely related more to her POTs and commodities. She has made fair progress with her goals however continues to note challenges with other medical concerns impacting her progress with PT. Based on her current LOF she is able to maintain and progress her current LOF IND and will be formally D/C from PT today.   EVAL- Patient is a 33 y.o. F who was seen today for physical therapy evaluation and treatment for General weakness following her Right ventriculoateral shunt revision on 02/22/2023. She demonstrate function ROM with the exception of limited cervical L rotation. She demonstrates gross weakness in both bil UE/LE, seated /standing she demonstrates postural sway.  She reports have 1 fall down a flight of steps noting she only had bruises and didn't hit her  head but also didn't notify her MD or neurosurgeon. Vitals were assessed before and after session with BP starting at 141/97 and exhibited improvement at end of session following exercise in both supine and seated  position. Jeydi reports challenges with endurance and fatigue and noted  it has been more challenging this week due to return to work full status and had worked 3 consecutive 10 hours shifts. She would benefit from physical therapy to decrease neck stiffness/ tenderness, improve gross UE/LE strength, maximize stability and balance.   OBJECTIVE IMPAIRMENTS: decreased activity tolerance, decreased endurance, decreased ROM, decreased strength, improper body mechanics, and postural dysfunction.   ACTIVITY LIMITATIONS: carrying, lifting, bending, standing, squatting, reach over head, and locomotion level  PARTICIPATION LIMITATIONS: shopping, community activity, and occupation  PERSONAL FACTORS: Behavior pattern, Past/current experiences, Time since onset of injury/illness/exacerbation, and 1-2 comorbidities: POTS, EDS are also affecting patient's functional outcome.   REHAB POTENTIAL: Good  CLINICAL DECISION MAKING: Evolving/moderate complexity  EVALUATION COMPLEXITY: Moderate   GOALS: Goals reviewed with patient? Yes  SHORT TERM GOALS: Target date: 04/28/2023  Pt to be IND with initial HEP for therapeutic progression Baseline: Goal status: MET 05/19/23  LONG TERM GOALS: Target date: 09/15/2023    I'm gross UE strength to >/= 4/5 to promote stablity lifting/ carrying activities / tasks Baseline:  Goal status: partially met 08/25/2023  2.  Improve gross LE strnegth to >/= 4/5 to assist with lifting mechanics and maintaining posture and stability  Baseline:  Goal status: partially met 08/25/2023  3.  Improve FOTO score to >/= 68% to demo improvement in function Baseline:  Goal status: partially met 08/25/2023  4.  Pt to be able to work a 10 hour day performing regular work tasks  reporting </= min fatigue to demonstrate improvement in endurance. 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  Improve core stength to >/= 4/5 to promote trunk stability/ reduce postural sway and maximize safety with work and ADLs Baseline:  Status:  Goal status: partially met 08/25/2023  6.  Pt to be IND with all HEP and will be able to maintain and progress her current LOF IND Baseline:  Goal status: partially met 08/25/2023  7. Pt to be able stand for the entire session >/= 30 minutes and not need to rely on leaning without feeling off balance or unstable during work related tasks as well as personal ADLs per her personal goal .  Baseline:  Status: reports 15 minutes but notes she is able to do it for 8 minutes.  Goal status: partially met 08/25/2023 PLAN:  PT FREQUENCY: 1-2x/week  PT DURATION: 8 weeks  PLANNED INTERVENTIONS: 97164- PT Re-evaluation, 97110-Therapeutic exercises, 97530- Therapeutic activity, 97112- Neuromuscular re-education, 97535- Self Care, 16109- Manual therapy, 97116- Gait training, 97014- Electrical stimulation (unattended), Patient/Family education, Dry Needling, Cryotherapy, and Moist heat.  PLAN FOR NEXT SESSION: Discharge  Ashleen Demma PT, DPT, LAT, ATC  08/25/23  5:13 PM

## 2023-10-06 DIAGNOSIS — R42 Dizziness and giddiness: Secondary | ICD-10-CM | POA: Insufficient documentation

## 2023-10-06 DIAGNOSIS — I499 Cardiac arrhythmia, unspecified: Secondary | ICD-10-CM | POA: Insufficient documentation

## 2023-10-18 ENCOUNTER — Ambulatory Visit (INDEPENDENT_AMBULATORY_CARE_PROVIDER_SITE_OTHER): Admitting: Neurosurgery

## 2023-10-18 ENCOUNTER — Encounter: Payer: Self-pay | Admitting: Neurosurgery

## 2023-10-18 VITALS — BP 114/82 | HR 78 | Ht 66.0 in | Wt 199.0 lb

## 2023-10-18 DIAGNOSIS — G932 Benign intracranial hypertension: Secondary | ICD-10-CM

## 2023-10-18 DIAGNOSIS — Z982 Presence of cerebrospinal fluid drainage device: Secondary | ICD-10-CM | POA: Diagnosis not present

## 2023-10-18 DIAGNOSIS — R42 Dizziness and giddiness: Secondary | ICD-10-CM

## 2023-10-18 NOTE — Progress Notes (Signed)
 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  Patient is going to undergo a pacemaker placement at [facility] on Friday.  She is having episodes of vertigo and is scheduled to see ENT at [facility] on September 11.  We decided to see each of the back after that to consider dialing the valve up to see if this makes a positive influence.

## 2023-11-24 ENCOUNTER — Encounter: Payer: Self-pay | Admitting: Neurosurgery

## 2023-11-25 NOTE — Telephone Encounter (Signed)
 I called over to [facility] to see if they can see her for her vertigo evaluation.

## 2023-12-01 ENCOUNTER — Inpatient Hospital Stay
Admission: RE | Admit: 2023-12-01 | Discharge: 2023-12-01 | Disposition: A | Discharge status: Home / Self Care | Payer: Self-pay | Source: Ambulatory Visit | Attending: Neurosurgery | Admitting: Neurosurgery

## 2023-12-01 ENCOUNTER — Other Ambulatory Visit: Payer: Self-pay

## 2023-12-01 DIAGNOSIS — Z049 Encounter for examination and observation for unspecified reason: Secondary | ICD-10-CM

## 2023-12-01 NOTE — Telephone Encounter (Signed)
 Patient scheduled for tomorrow 9/12.

## 2023-12-02 ENCOUNTER — Ambulatory Visit (INDEPENDENT_AMBULATORY_CARE_PROVIDER_SITE_OTHER): Admitting: Neurosurgery

## 2023-12-02 ENCOUNTER — Telehealth: Admitting: Neurosurgery

## 2023-12-02 ENCOUNTER — Ambulatory Visit: Admitting: Neurosurgery

## 2023-12-02 DIAGNOSIS — G932 Benign intracranial hypertension: Secondary | ICD-10-CM | POA: Diagnosis not present

## 2023-12-02 NOTE — Progress Notes (Signed)
 Virtual Visit via Telephone Note  I connected with Roberta [name] on 12/02/23 at  4:00 PM EDT by telephone and verified that I am speaking with the correct person using two identifiers.  Location: Patient: Home Provider: Clinic   I discussed the limitations, risks, security and privacy concerns of performing an evaluation and management service by telephone and the availability of in person appointments. I also discussed with the patient that there may be a patient responsible charge related to this service. The patient expressed understanding and agreed to proceed.   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  She was referred to neuro-ophthalmology at [facility].  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  She is going to see ENT for her vertigo.  She can call me and let me know what the ophthalmologist at [facility] say.  I reminded again that we are not going to operate on her again given her previous experience with the surgery with a prolonged period of significant mental health deterioration after the surgery.  She is in agreement with that plan.    I discussed the assessment and treatment plan with the patient. The patient was provided an opportunity to ask questions and all were answered. The patient agreed with the plan and demonstrated an understanding of the instructions.   The patient was advised to call back or seek an in-person evaluation if the symptoms worsen or if the condition fails to improve as anticipated.  I provided 10 minutes of non-face-to-face time during this encounter.   Shrey Boike, MD

## 2023-12-06 ENCOUNTER — Ambulatory Visit: Admitting: Neurosurgery

## 2024-01-11 ENCOUNTER — Telehealth: Payer: Self-pay | Admitting: Neurosurgery

## 2024-01-11 NOTE — Telephone Encounter (Signed)
 Called and left VM to call back and reschedule appt on 10/28

## 2024-01-17 ENCOUNTER — Ambulatory Visit: Admitting: Neurosurgery

## 2024-01-19 ENCOUNTER — Inpatient Hospital Stay
Admission: RE | Admit: 2024-01-19 | Discharge: 2024-01-19 | Disposition: A | Discharge status: Home / Self Care | Payer: Self-pay | Source: Ambulatory Visit | Attending: Neurosurgery | Admitting: Neurosurgery

## 2024-01-19 ENCOUNTER — Inpatient Hospital Stay
Admission: RE | Admit: 2024-01-19 | Discharge: 2024-01-19 | Disposition: A | Payer: Self-pay | Source: Ambulatory Visit | Attending: Neurosurgery | Admitting: Neurosurgery

## 2024-01-19 ENCOUNTER — Other Ambulatory Visit: Payer: Self-pay

## 2024-01-19 DIAGNOSIS — Z049 Encounter for examination and observation for unspecified reason: Secondary | ICD-10-CM

## 2024-01-21 ENCOUNTER — Emergency Department (HOSPITAL_BASED_OUTPATIENT_CLINIC_OR_DEPARTMENT_OTHER)
Admission: EM | Admit: 2024-01-21 | Discharge: 2024-01-21 | Disposition: A | Discharge status: Home / Self Care | Attending: Emergency Medicine | Admitting: Emergency Medicine

## 2024-01-21 ENCOUNTER — Other Ambulatory Visit: Payer: Self-pay

## 2024-01-21 ENCOUNTER — Emergency Department (HOSPITAL_BASED_OUTPATIENT_CLINIC_OR_DEPARTMENT_OTHER)

## 2024-01-21 DIAGNOSIS — S60511A Abrasion of right hand, initial encounter: Secondary | ICD-10-CM | POA: Diagnosis not present

## 2024-01-21 DIAGNOSIS — Z982 Presence of cerebrospinal fluid drainage device: Secondary | ICD-10-CM | POA: Insufficient documentation

## 2024-01-21 DIAGNOSIS — W19XXXA Unspecified fall, initial encounter: Secondary | ICD-10-CM

## 2024-01-21 DIAGNOSIS — S0993XA Unspecified injury of face, initial encounter: Secondary | ICD-10-CM | POA: Diagnosis present

## 2024-01-21 DIAGNOSIS — Y9302 Activity, running: Secondary | ICD-10-CM | POA: Insufficient documentation

## 2024-01-21 DIAGNOSIS — Z7901 Long term (current) use of anticoagulants: Secondary | ICD-10-CM | POA: Insufficient documentation

## 2024-01-21 DIAGNOSIS — W01198A Fall on same level from slipping, tripping and stumbling with subsequent striking against other object, initial encounter: Secondary | ICD-10-CM | POA: Diagnosis not present

## 2024-01-21 DIAGNOSIS — S60512A Abrasion of left hand, initial encounter: Secondary | ICD-10-CM | POA: Diagnosis not present

## 2024-01-21 DIAGNOSIS — S0081XA Abrasion of other part of head, initial encounter: Secondary | ICD-10-CM | POA: Insufficient documentation

## 2024-01-21 DIAGNOSIS — Z79899 Other long term (current) drug therapy: Secondary | ICD-10-CM | POA: Insufficient documentation

## 2024-01-21 DIAGNOSIS — T07XXXA Unspecified multiple injuries, initial encounter: Secondary | ICD-10-CM

## 2024-01-21 MED ORDER — ACETAMINOPHEN 325 MG PO TABS
650.0000 mg | ORAL_TABLET | Freq: Once | ORAL | Status: AC
  Administered 2024-01-21: 650 mg via ORAL
  Filled 2024-01-21: qty 2

## 2024-01-21 NOTE — ED Triage Notes (Signed)
 Pt POV reporting fell while running, fell forward, hit chin on concrete, abrasion noted. On eliquis r/t [facility] shunt. No LOC.

## 2024-01-21 NOTE — ED Provider Notes (Signed)
 [place] EMERGENCY DEPARTMENT AT [facility] Provider Note   CSN: [id number] Arrival date & time: 01/21/24  1744     Patient presents with: fall on thinners   Roberta [name] is a 33 y.o. female past medical history significant for [facility] shunt anticoagulated on Eliquis since today after a mechanical fall while running.  Patient states she fell forward and mostly caught her self with her palms on the concrete, however did hit her chin and jaw.  Patient reports mild headache and initially had some visual changes which have since resolved.  Patient has mild neck pain with movement.  Patient denies tinnitus, diplopia, numbness, weakness, saddle anesthesia, loss of bowel or bladder control, trismus, jaw malalignment, or loose or broken teeth.   HPI     Prior to Admission medications   Medication Sig Start Date End Date Taking? Authorizing Provider  acarbose (PRECOSE) 25 MG tablet Take 25 mg by mouth 3 (three) times daily with meals. 07/19/23   [provider]  desvenlafaxine  (PRISTIQ ) 100 MG 24 hr tablet Take 100 mg by mouth daily. 06/29/21   [provider]  ELIQUIS 2.5 MG TABS tablet Take 2.5 mg by mouth 2 (two) times daily.    [provider]  EPINEPHrine 0.3 mg/0.3 mL IJ SOAJ injection Inject 0.3 mg into the muscle as needed for anaphylaxis. 03/09/16   [provider]  guanFACINE (TENEX) 1 MG tablet Take 1 mg by mouth at bedtime.    [provider]  ivabradine (CORLANOR) 5 MG TABS tablet Take 7.5 mg by mouth 2 (two) times daily with a meal.    [provider]  ivabradine (CORLANOR) 7.5 MG TABS tablet Take 7.5 mg by mouth 2 (two) times daily with a meal. 10/05/23   [provider]  metoprolol succinate (TOPROL-XL) 50 MG 24 hr tablet Take 25 mg by mouth 2 (two) times daily. Take with or immediately following a meal.    [provider]  ondansetron  (ZOFRAN -ODT) 8 MG disintegrating tablet Take 8 mg by mouth every 8  (eight) hours as needed for nausea or vomiting.    [provider]  zolpidem (AMBIEN) 5 MG tablet Take 5 mg by mouth at bedtime. 10/28/22   [provider]    Allergies: Nitrofurantoin, Nitrofurantoin macrocrystal, Nitrofurantoin monohyd macro, Shellfish allergy, Shellfish protein-containing drug products, Influenza vac a&b sa adj quad, Macrolides and ketolides, and Penicillin g    Review of Systems  Musculoskeletal:  Positive for neck pain.  Skin:  Positive for wound.    Updated Vital Signs BP (!) 143/97   Pulse 93   Temp 97.8 F (36.6 C) (Temporal)   Resp 16   Ht 5' 6 (1.676 m)   Wt 86.2 kg   LMP 01/21/2024   SpO2 100%   BMI 30.67 kg/m   Physical Exam Vitals and nursing note reviewed.  Constitutional:      General: She is not in acute distress.    Appearance: She is well-developed. She is not toxic-appearing.  HENT:     Head: Normocephalic. No raccoon eyes or Battle's sign.     Jaw: Pain on movement present. No trismus, swelling or malocclusion.      Comments: Approximately 2 cm in diameter superficial abrasion as noted above.  Wound is hemostatic on exam.    Right Ear: External ear normal.     Left Ear: External ear normal.     Nose: Nose normal.  Eyes:     Extraocular Movements: Extraocular  movements intact.     Conjunctiva/sclera: Conjunctivae normal.     Pupils: Pupils are equal, round, and reactive to light.  Cardiovascular:     Rate and Rhythm: Normal rate and regular rhythm.     Pulses: Normal pulses.     Heart sounds: Normal heart sounds. No murmur heard. Pulmonary:     Effort: Pulmonary effort is normal. No respiratory distress.     Breath sounds: Normal breath sounds.  Abdominal:     Palpations: Abdomen is soft.     Tenderness: There is no abdominal tenderness.  Musculoskeletal:        General: No swelling or tenderness.     Cervical back: Neck supple.     Comments: Equal bilateral grip strength.  Patient is neurovascularly intact.   Skin:    General: Skin is warm and dry.     Capillary Refill: Capillary refill takes less than 2 seconds.     Comments: Patient has bilateral superficial abrasions to the anterior palms.  Wounds are hemostatic on exam.  Neurological:     General: No focal deficit present.     Mental Status: She is alert and oriented to person, place, and time.     Cranial Nerves: No cranial nerve deficit.     Sensory: No sensory deficit.     Motor: No weakness.  Psychiatric:        Mood and Affect: Mood normal.     (all labs ordered are listed, but only abnormal results are displayed) Labs Reviewed - No data to display  EKG: None  Radiology: CT Cervical Spine Wo Contrast Result Date: 01/21/2024 EXAM: CT CERVICAL SPINE WITHOUT CONTRAST 01/21/2024 06:57:22 PM TECHNIQUE: CT of the cervical spine was performed without the administration of intravenous contrast. Multiplanar reformatted images are provided for review. Automated exposure control, iterative reconstruction, and/or weight based adjustment of the mA/kV was utilized to reduce the radiation dose to as low as reasonably achievable. COMPARISON: None available. CLINICAL HISTORY: Neck trauma, impaired ROM (Age 80-64y) FINDINGS: CERVICAL SPINE: BONES AND ALIGNMENT: Straightening with mild reversal of the normal cervical lordosis. No listhesis. No acute fracture or traumatic malalignment. DEGENERATIVE CHANGES: No significant degenerative changes. SOFT TISSUES: Catheter for VP showed catheter seen coursing inferiorly within the right neck. No prevertebral soft tissue swelling. IMPRESSION: 1. No acute traumatic injury within the cervical spine. Electronically signed by: Morene Hoard MD 01/21/2024 07:08 PM EDT RP Workstation: [id number]   CT Maxillofacial WO CM Result Date: 01/21/2024 EXAM: CT OF THE FACE WITHOUT CONTRAST 01/21/2024 06:57:22 PM TECHNIQUE: CT of the face was performed without the administration of intravenous contrast. Multiplanar  reformatted images are provided for review. Automated exposure control, iterative reconstruction, and/or weight based adjustment of the mA/kV was utilized to reduce the radiation dose to as low as reasonably achievable. COMPARISON: None available. CLINICAL HISTORY: Facial trauma, blunt. FINDINGS: FACIAL BONES: No acute facial fracture. No mandibular dislocation. No suspicious bone lesion. ORBITS: Globes are intact. No acute traumatic injury. No inflammatory change. SINUSES AND MASTOIDS: No acute abnormality. SOFT TISSUES: Soft tissue swelling/contusion present at the chin. IMPRESSION: 1. No acute facial fracture. 2. Soft tissue swelling/contusion at the chin. Electronically signed by: Morene Hoard MD 01/21/2024 07:05 PM EDT RP Workstation: [id number]   CT Head Wo Contrast Result Date: 01/21/2024 EXAM: CT HEAD WITHOUT CONTRAST 01/21/2024 06:57:22 PM TECHNIQUE: CT of the head was performed without the administration of intravenous contrast. Automated exposure control, iterative reconstruction, and/or weight based adjustment of the mA/kV was utilized to  reduce the radiation dose to as low as reasonably achievable. COMPARISON: None available. CLINICAL HISTORY: Head trauma, coagulopathy (Age 15-64y). FINDINGS: BRAIN AND VENTRICLES: Right frontal approach VP shunt catheter in place with tip terminating near the foramen of Monro. Ventricular system is decompressed. No hydrocephalus. No acute hemorrhage. No evidence of acute infarct. No extra-axial collection. No mass effect or midline shift. ORBITS: No acute abnormality. SINUSES: No acute abnormality. SOFT TISSUES AND SKULL: No acute soft tissue abnormality. No skull fracture. VASCULATURE: Vascular stent traversing the distal right transverse sinus noted. IMPRESSION: 1. No acute intracranial abnormality. 2. Right frontal approach vp shunt catheter in place without adverse features. No hydrocephalus. 3. Vascular stent in place at the distal right transverse sinus.  Electronically signed by: Morene Hoard MD 01/21/2024 07:03 PM EDT RP Workstation: [id number]     Procedures   Medications Ordered in the ED  acetaminophen (TYLENOL) tablet 650 mg (650 mg Oral Given 01/21/24 1853)                                    Medical Decision Making Amount and/or Complexity of Data Reviewed Radiology: ordered.  Risk OTC drugs.   This patient presents to the ED for concern of fall differential diagnosis includes brain bleed, abrasion, fracture, dislocation, musculoskeletal pain    Additional history obtained  Additional history obtained from Electronic Medical Record External records from outside source obtained and reviewed including care everywhere   Imaging Studies ordered:  I ordered imaging studies including CT head, maxillofacial, C-spine Noncon I independently visualized and interpreted imaging which showed no acute traumatic injury within the C-spine, no acute intracranial abnormality.  No acute facial fracture.  Soft tissue swelling/contusion at the chin. I agree with the radiologist interpretation   Medicines ordered and prescription drug management:  I ordered medication including Tylenol    I have reviewed the patients home medicines and have made adjustments as needed   Problem List / ED Course:  Consider for admission or further workup however patient's vital signs, physical exam, and imaging is reassuring.  Patient's symptoms likely due to superficial abrasions.  Patient given return precautions.  I feel patient safer discharge at this time.      Final diagnoses:  Fall, initial encounter  Abrasions of multiple sites    ED Discharge Orders     None          Francis Ileana LOISE DEVONNA 01/21/24 1927    Jerrol Agent, MD 01/21/24 2239

## 2024-01-21 NOTE — Discharge Instructions (Addendum)
 Today you were seen after a fall.  Your workup on the emergency department was reassuring.  Please keep your abrasions clean and dry.  Please return to the ED, your PCP, or urgent care if you have signs of infection to include red streaking around your abrasions, puslike discharge, or fever that does not go down Tylenol or Motrin.  Thank you for letting us  treat you today. After reviewing your labs and imaging, I feel you are safe to go home. Please follow up with your PCP in the next several days and provide them with your records from this visit. Return to the Emergency Room if pain becomes severe or symptoms worsen.

## 2024-01-24 ENCOUNTER — Ambulatory Visit (INDEPENDENT_AMBULATORY_CARE_PROVIDER_SITE_OTHER): Admitting: Neurosurgery

## 2024-01-24 VITALS — BP 144/105 | HR 93 | Ht 66.0 in | Wt 200.0 lb

## 2024-01-24 DIAGNOSIS — Z982 Presence of cerebrospinal fluid drainage device: Secondary | ICD-10-CM | POA: Diagnosis not present

## 2024-01-24 DIAGNOSIS — G932 Benign intracranial hypertension: Secondary | ICD-10-CM

## 2024-01-24 NOTE — Progress Notes (Signed)
 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  She is having to take very high doses of vitamin C.  She had an MRI done and comes in today to have her shunt programmed back to 150 which is what I did.  She is going to go see neuro-ophthalmology at [facility].  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  She will let us  know when exam has been done and if needed, upload the results.

## 2024-01-25 ENCOUNTER — Encounter: Payer: Self-pay | Admitting: Neurosurgery

## 2024-01-26 ENCOUNTER — Other Ambulatory Visit: Payer: Self-pay

## 2024-01-26 ENCOUNTER — Telehealth: Payer: Self-pay

## 2024-01-26 ENCOUNTER — Encounter: Payer: Self-pay | Admitting: Neurosurgery

## 2024-01-26 DIAGNOSIS — G932 Benign intracranial hypertension: Secondary | ICD-10-CM

## 2024-01-26 NOTE — Telephone Encounter (Signed)
 Patient called and asked if she needs to go to the ER because of her imaging results. Roberta [name] stated that she  reached out to Dr. Rosslyn to confirm.

## 2024-01-26 NOTE — Progress Notes (Signed)
 Patient called in this morning with concern about the MRV results.  I had messaged her last night about the likely artifactual nature of the finding.  I called this morning. I told her that with her doing well, I do not suspect that there is an abnormality and wouldn't recommend a CTV but would be happy to get one if she wanted.   After weighing her options, she decided against it and declined the CTV. If she changes her mind or her condition changes, she will let me know.

## 2024-01-26 NOTE — Telephone Encounter (Signed)
 Pt called in this morning stating that after she received her MRA yesterday the hospital called to advise her that she has a code signiciant record for a potential thrombosis. They are stating that she needs a STAT CT Venogram w/contrast.

## 2024-01-27 ENCOUNTER — Ambulatory Visit (HOSPITAL_BASED_OUTPATIENT_CLINIC_OR_DEPARTMENT_OTHER)
Admission: RE | Admit: 2024-01-27 | Discharge: 2024-01-27 | Disposition: A | Discharge status: Home / Self Care | Source: Ambulatory Visit | Attending: Neurosurgery | Admitting: Neurosurgery

## 2024-01-27 DIAGNOSIS — G932 Benign intracranial hypertension: Secondary | ICD-10-CM | POA: Insufficient documentation

## 2024-01-27 MED ORDER — IOHEXOL 350 MG/ML SOLN
75.0000 mL | Freq: Once | INTRAVENOUS | Status: AC | PRN
  Administered 2024-01-27: 75 mL via INTRAVENOUS

## 2024-03-01 ENCOUNTER — Encounter: Payer: Self-pay | Admitting: Neurosurgery

## 2024-03-02 ENCOUNTER — Other Ambulatory Visit: Payer: Self-pay

## 2024-03-02 DIAGNOSIS — G932 Benign intracranial hypertension: Secondary | ICD-10-CM

## 2024-03-02 MED ORDER — ELIQUIS 2.5 MG PO TABS: 2.5000 mg | ORAL_TABLET | Freq: Two times a day (BID) | ORAL | 0 refills | Status: AC
